# Patient Record
Sex: Male | Born: 1960
Health system: Southern US, Community
[De-identification: ages and names within clinical notes are randomized; demographics above are authoritative.]

## PROBLEM LIST (undated history)

## (undated) DIAGNOSIS — E119 Type 2 diabetes mellitus without complications: Secondary | ICD-10-CM

## (undated) DIAGNOSIS — E785 Hyperlipidemia, unspecified: Secondary | ICD-10-CM

## (undated) DIAGNOSIS — K4020 Bilateral inguinal hernia, without obstruction or gangrene, not specified as recurrent: Secondary | ICD-10-CM

## (undated) DIAGNOSIS — K649 Unspecified hemorrhoids: Secondary | ICD-10-CM

## (undated) DIAGNOSIS — Z87442 Personal history of urinary calculi: Secondary | ICD-10-CM

## (undated) DIAGNOSIS — K219 Gastro-esophageal reflux disease without esophagitis: Secondary | ICD-10-CM

## (undated) DIAGNOSIS — I10 Essential (primary) hypertension: Secondary | ICD-10-CM

## (undated) DIAGNOSIS — I499 Cardiac arrhythmia, unspecified: Secondary | ICD-10-CM

## (undated) DIAGNOSIS — K648 Other hemorrhoids: Secondary | ICD-10-CM

## (undated) HISTORY — DX: Essential (primary) hypertension: I10

## (undated) HISTORY — DX: Hyperlipidemia, unspecified: E78.5

## (undated) HISTORY — PX: COLONOSCOPY: SHX174

## (undated) HISTORY — DX: Bilateral inguinal hernia, without obstruction or gangrene, not specified as recurrent: K40.20

## (undated) HISTORY — DX: Unspecified hemorrhoids: K64.9

## (undated) HISTORY — DX: Other hemorrhoids: K64.8

## (undated) HISTORY — DX: Type 2 diabetes mellitus without complications: E11.9

## (undated) HISTORY — PX: NO PAST SURGERIES: SHX2092

---
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
## 2016-09-23 ENCOUNTER — Encounter (HOSPITAL_COMMUNITY): Payer: Self-pay

## 2016-09-23 ENCOUNTER — Emergency Department (HOSPITAL_COMMUNITY)
Admission: EM | Admit: 2016-09-23 | Discharge: 2016-09-23 | Disposition: A | Discharge status: Home / Self Care | Payer: Self-pay | Attending: Emergency Medicine | Admitting: Emergency Medicine

## 2016-09-23 DIAGNOSIS — R040 Epistaxis: Secondary | ICD-10-CM | POA: Insufficient documentation

## 2016-09-23 DIAGNOSIS — Z7982 Long term (current) use of aspirin: Secondary | ICD-10-CM | POA: Insufficient documentation

## 2016-09-23 DIAGNOSIS — I10 Essential (primary) hypertension: Secondary | ICD-10-CM | POA: Insufficient documentation

## 2016-09-23 DIAGNOSIS — Z79899 Other long term (current) drug therapy: Secondary | ICD-10-CM | POA: Insufficient documentation

## 2016-09-23 LAB — CBC
HCT: 42.4 % (ref 39.0–52.0)
Hemoglobin: 13.6 g/dL (ref 13.0–17.0)
MCH: 29.2 pg (ref 26.0–34.0)
MCHC: 32.1 g/dL (ref 30.0–36.0)
MCV: 91 fL (ref 78.0–100.0)
PLATELETS: 225 10*3/uL (ref 150–400)
RBC: 4.66 MIL/uL (ref 4.22–5.81)
RDW: 14.5 % (ref 11.5–15.5)
WBC: 12.1 10*3/uL — AB (ref 4.0–10.5)

## 2016-09-23 LAB — PROTIME-INR
INR: 1.33
PROTHROMBIN TIME: 16.6 s — AB (ref 11.4–15.2)

## 2016-09-23 LAB — RAPID URINE DRUG SCREEN, HOSP PERFORMED
Amphetamines: NOT DETECTED
BENZODIAZEPINES: NOT DETECTED
Barbiturates: NOT DETECTED
Cocaine: NOT DETECTED
Opiates: NOT DETECTED
Tetrahydrocannabinol: NOT DETECTED

## 2016-09-23 LAB — BASIC METABOLIC PANEL
Anion gap: 8 (ref 5–15)
BUN: 17 mg/dL (ref 6–20)
CALCIUM: 8.9 mg/dL (ref 8.9–10.3)
CO2: 27 mmol/L (ref 22–32)
CREATININE: 1.05 mg/dL (ref 0.61–1.24)
Chloride: 104 mmol/L (ref 101–111)
GFR calc Af Amer: >60 mL/min (ref >60)
GFR calc non Af Amer: >60 mL/min (ref >60)
Glucose, Bld: 150 mg/dL — ABNORMAL HIGH (ref 65–99)
Potassium: 3.6 mmol/L (ref 3.5–5.1)
Sodium: 139 mmol/L (ref 135–145)

## 2016-09-23 LAB — I-STAT TROPONIN, ED: TROPONIN I, POC: 0.01 ng/mL (ref 0.00–0.08)

## 2016-09-23 MED ORDER — OXYMETAZOLINE HCL 0.05 % NA SOLN: 1.0000 | Freq: Once | NASAL | Status: DC

## 2016-09-23 MED ORDER — LABETALOL HCL 5 MG/ML IV SOLN
20.0000 mg | Freq: Once | INTRAVENOUS | Status: AC
  Administered 2016-09-23: 20 mg via INTRAVENOUS

## 2016-09-23 MED ORDER — CEPHALEXIN 500 MG PO CAPS: 500.0000 mg | ORAL_CAPSULE | Freq: Four times a day (QID) | ORAL | 0 refills | Status: DC

## 2016-09-23 MED ORDER — LISINOPRIL-HYDROCHLOROTHIAZIDE 10-12.5 MG PO TABS: 1.0000 | ORAL_TABLET | Freq: Every day | ORAL | 1 refills | Status: DC

## 2016-09-23 NOTE — ED Triage Notes (Signed)
Pt states he began having nose bleed this morning around 0630. Nose still bleeding at this time. Pt denies any pain. Noted to be severely hypertensive, pt also diaphoretic. Pt alert and oriented X4.

## 2016-09-23 NOTE — ED Provider Notes (Signed)
[facility]-EMERGENCY DEPT Provider Note   CSN: [id number] Arrival date & time: 09/23/16  0919     History   Chief Complaint Chief Complaint  Patient presents with  . Epistaxis    HPI Scott Mcmillan is a 56 y.o. male.  HPI Patient presents to the emergency room for evaluation of a nosebleed.  Patient states his symptoms started about 0 6:30 this morning. He suddenly started having bleeding out of his left side of his nose. Patient denies any recent injuries. He denies any prior history of nosebleeds. He denies any history of easy bruising or bleeding. In the emergency room the patient was also noted to be hypertensive. She denies any history of hypertension however he has not seen a doctor in many years. Denies any difficulty with chest pain or shortness of breath. No vomiting or diarrhea. No numbness or weakness History reviewed. No pertinent past medical history.  There are no active problems to display for this patient.   History reviewed. No pertinent surgical history.     Home Medications    Prior to Admission medications   Medication Sig Start Date End Date Taking? Authorizing Provider  aspirin 325 MG tablet Take 325 mg by mouth every 6 (six) hours as needed.   Yes [provider]  cephALEXin (KEFLEX) 500 MG capsule Take 1 capsule (500 mg total) by mouth 4 (four) times daily. 09/23/16   Linwood DibblesKnapp, Roslin Norwood, MD  lisinopril-hydrochlorothiazide (ZESTORETIC) 10-12.5 MG tablet Take 1 tablet by mouth daily. 09/23/16   Linwood DibblesKnapp, Rylie Knierim, MD    Family History History reviewed. No pertinent family history.  Social History Social History  Substance Use Topics  . Smoking status: Never Smoker  . Smokeless tobacco: Never Used  . Alcohol use No     Allergies   Patient has no known allergies.   Review of Systems Review of Systems  All other systems reviewed and are negative.    Physical Exam Updated Vital Signs BP (!) 168/94   Pulse 78   Temp 98.2 F (36.8 C) (Oral)   Resp 16    Ht 1.727 m (5\' 8" )   Wt 136.1 kg (300 lb)   SpO2 95%   BMI 45.61 kg/m   Physical Exam  Constitutional: He appears well-developed and well-nourished. No distress.  HENT:  Head: Normocephalic and atraumatic.  Right Ear: External ear normal.  Left Ear: External ear normal.  Nose: Epistaxis (bright red blood from the left nares, unable to visualize source) is observed.  Eyes: Conjunctivae are normal. Right eye exhibits no discharge. Left eye exhibits no discharge. No scleral icterus.  Neck: Neck supple. No tracheal deviation present.  Cardiovascular: Normal rate, regular rhythm and intact distal pulses.   Pulmonary/Chest: Effort normal and breath sounds normal. No stridor. No respiratory distress. He has no wheezes. He has no rales.  Abdominal: Soft. Bowel sounds are normal. He exhibits no distension. There is no tenderness. There is no rebound and no guarding.  Musculoskeletal: He exhibits no edema or tenderness.  Neurological: He is alert. He has normal strength. No cranial nerve deficit (no facial droop, extraocular movements intact, no slurred speech) or sensory deficit. He exhibits normal muscle tone. He displays no seizure activity. Coordination normal.  Skin: Skin is warm. No rash noted. He is diaphoretic.  Psychiatric: He has a normal mood and affect.  Nursing note and vitals reviewed.    ED Treatments / Results  Labs (all labs ordered are listed, but only abnormal results are displayed) Labs Reviewed  CBC - Abnormal; Notable for the following:       Result Value   WBC 12.1 (*)    All other components within normal limits  BASIC METABOLIC PANEL - Abnormal; Notable for the following:    Glucose, Bld 150 (*)    All other components within normal limits  PROTIME-INR - Abnormal; Notable for the following:    Prothrombin Time 16.6 (*)    All other components within normal limits  RAPID URINE DRUG SCREEN, HOSP PERFORMED  I-STAT TROPOININ, ED    EKG  EKG  Interpretation  Date/Time:  Tuesday Sep 23 2016 10:50:55 EDT Ventricular Rate:  83 PR Interval:    QRS Duration: 103 QT Interval:  394 QTC Calculation: 463 R Axis:   88 Text Interpretation:  Sinus rhythm Ventricular premature complex Probable left atrial enlargement Left ventricular hypertrophy Baseline wander in lead(s) V3 V4 No old tracing to compare Confirmed by Linwood Dibbles [phone]) on 09/23/2016 10:58:38 AM        Procedures .Epistaxis Management Date/Time: 09/23/2016 10:30 AM Performed by: Linwood Dibbles Authorized by: Linwood Dibbles   Consent:    Consent obtained:  Verbal   Consent given by:  Patient Anesthesia (see MAR for exact dosages):    Anesthesia method:  Topical application   Topical anesthetic:  Epinephrine (with afrin) Procedure details:    Treatment site:  L anterior   Treatment method:  Nasal balloon   Treatment complexity:  Extensive   Treatment episode: initial   Post-procedure details:    Assessment:  No improvement   Patient tolerance of procedure:  Tolerated well, no immediate complications Comments:     2nd attempt made with a 7.5 rhinostat. Bleeding persisted.  Additional anterior rhino rocket placed adjacent to the nasal balloon.  Bleeding seems to have stopped now.  10:35 AM    (including critical care time)  Medications Ordered in ED Medications  labetalol (NORMODYNE,TRANDATE) injection 20 mg (20 mg Intravenous Given 09/23/16 1048)     Initial Impression / Assessment and Plan / ED Course  I have reviewed the triage vital signs and the nursing notes.  Pertinent labs & imaging results that were available during my care of the patient were reviewed by me and considered in my medical decision making (see chart for details).  Clinical Course as of Sep 23 1225  Tue Sep 23, 2016  6045 Constant swab soaked in Afrin and lidocaine placed in the left nares.  We'll proceed with evaluation of his hypertension and reassess his bleeding and short period of time   [JK]  1029 Initial attempt with 5.5 cm rhinostat did not control the bleeding.  Replaced with a 7.5.  Bleeding seems to be coming from just posterior to the nasal turbinate.  [JK]  1100 Bleeding is controlled  [JK]    Clinical Course User Index [JK] Linwood Dibbles, MD    Patient's nosebleed was ultimately controlled with treatment in the emergency room. I suspect he has an anterior bleed that did respond to nasal packing. Plan on discharge home with ENT follow-up.  Patient was extremely hypertensive when he arrived.  Patient medically has a history of hypertension. He used to be on blood pressure medications. He has not been able to see a doctor for years because of financial issues.  Patient states he was taking Hyzaar and then Cozaar one point. He is not sure the exact dosages. He thinks he was eventually switched to lisinopril.  Patient denies any chest pain or shortness of  breath. I doubt hypertensive emergency/urgency. I will discharge him home on lisinopril HCTZ and recommended he follow up with a primary care doctor. I provided the name of the [facility]  Final Clinical Impressions(s) / ED Diagnoses   Final diagnoses:  Epistaxis  Hypertension, unspecified type    New Prescriptions New Prescriptions   CEPHALEXIN (KEFLEX) 500 MG CAPSULE    Take 1 capsule (500 mg total) by mouth 4 (four) times daily.   LISINOPRIL-HYDROCHLOROTHIAZIDE (ZESTORETIC) 10-12.5 MG TABLET    Take 1 tablet by mouth daily.     Linwood Dibbles, MD 09/23/16 [phone]

## 2016-09-25 ENCOUNTER — Ambulatory Visit (INDEPENDENT_AMBULATORY_CARE_PROVIDER_SITE_OTHER): Payer: Self-pay | Admitting: Otolaryngology

## 2016-09-25 DIAGNOSIS — R040 Epistaxis: Secondary | ICD-10-CM

## 2016-10-03 ENCOUNTER — Ambulatory Visit: Payer: Self-pay | Attending: Internal Medicine | Admitting: Physician Assistant

## 2016-10-03 VITALS — BP 163/100 | HR 93 | Temp 98.0°F | Resp 18 | Ht 64.0 in | Wt 270.0 lb

## 2016-10-03 DIAGNOSIS — I10 Essential (primary) hypertension: Secondary | ICD-10-CM | POA: Insufficient documentation

## 2016-10-03 DIAGNOSIS — R040 Epistaxis: Secondary | ICD-10-CM | POA: Insufficient documentation

## 2016-10-03 DIAGNOSIS — R739 Hyperglycemia, unspecified: Secondary | ICD-10-CM | POA: Insufficient documentation

## 2016-10-03 DIAGNOSIS — D72829 Elevated white blood cell count, unspecified: Secondary | ICD-10-CM | POA: Insufficient documentation

## 2016-10-03 LAB — POCT GLYCOSYLATED HEMOGLOBIN (HGB A1C): Hemoglobin A1C: 5.3

## 2016-10-03 LAB — GLUCOSE, POCT (MANUAL RESULT ENTRY): POC Glucose: 105 mg/dl — AB (ref 70–99)

## 2016-10-03 MED ORDER — LISINOPRIL-HYDROCHLOROTHIAZIDE 20-25 MG PO TABS: 1.0000 | ORAL_TABLET | Freq: Every day | ORAL | 3 refills | Status: DC

## 2016-10-03 NOTE — Progress Notes (Signed)
Patient is here for HFU:HTN  Patient denies pain at this time.  Patient took BP medication around 5:30am. Patient has not eaten today.

## 2016-10-03 NOTE — Progress Notes (Signed)
Patient ID: Scott Mcmillan, male   DOB: Jul 21, 1960, 56 y.o.   MRN: [id number]     Scott Mcmillan, is a 56 y.o. male  WUJ:[id number]  [id number]:[id number]  DOB - 04-05-1961  Subjective:  Chief Complaint and HPI: Scott Mcmillan is a 56 y.o. male here today to establish care and for a follow up visit After being seen in the ED for nosebleed on 09/23/2016.  Nasal balloon and rhinostat and rhino rocket required to stop bleeding.  He used to be on BP meds but hadn't been under the care of a PCP in years and not on meds for years.  Lisinopril/HCTZ was prescribed on discharge.    Today he presents doing well.  He is taking BP meds but hasn''t taken today's dose.  He saw the ENT a few days ago, packing was removed, and the area was cauterized.  He denies other bleeding such as bleeding gums, rectal bleeding, or other nose bleeds or easy bruising.  He denies CP/HA/dizziness.   EKG in ED: Ventricular Rate:   83 PR Interval:                        QRS Duration:        103 QT Interval:                      394 QTC Calculation:    463 R Axis:                         88 Text Interpretation:  Sinus rhythm Ventricular premature complex Probable left atrial enlargement Left ventricular hypertrophy Baseline wander in lead(s) V3 V4 No old tracing to compare Confirmed by Lynelle Doctor  Labs: WBC 12.1 (*)     All other components within normal limits  BASIC METABOLIC PANEL - Abnormal; Notable for the following:    Glucose, Bld 150 (*)    All other components within normal limits  PROTIME-INR - Abnormal; Notable for the following:    Prothrombin Time 16.6 (*)    Cardiac enzymes negative .   ED/Hospital notes reviewed and summarized as above.  ROS:   Constitutional:  No f/c, No night sweats, No unexplained weight loss. EENT:  No vision changes, No blurry vision, No hearing changes. No mouth, throat, or ear problems.  Respiratory: No cough, No SOB Cardiac: No CP, no palpitations GI:  No abd pain, No N/V/D. GU:  No Urinary s/sx Musculoskeletal: No joint pain Neuro: No headache, no dizziness, no motor weakness.  Skin: No rash Endocrine:  No polydipsia. No polyuria.  Psych: Denies SI/HI  No problems updated.  ALLERGIES: No Known Allergies  PAST MEDICAL HISTORY: History reviewed. No pertinent past medical history.  MEDICATIONS AT HOME: Prior to Admission medications   Medication Sig Start Date End Date Taking? Authorizing Provider  lisinopril-hydrochlorothiazide (PRINZIDE,ZESTORETIC) 20-25 MG tablet Take 1 tablet by mouth daily. 10/03/16   Anders Simmonds, PA-C     Objective:  EXAM:   Vitals:   10/03/16 0851  BP: (!) 163/100  Pulse: 93  Resp: 18  Temp: 98 F (36.7 C)  TempSrc: Oral  SpO2: 97%  Weight: 270 lb (122.5 kg)  Height: 5\' 4"  (1.626 m)    General appearance : A&OX3. NAD. Non-toxic-appearing, obese HEENT: Atraumatic and Normocephalic.  PERRLA. EOM intact.  TM clear B. Mouth-MMM, post pharynx WNL w/o erythema, No PND. Nose with scabbing and no bleeding. Neck: supple, no JVD.  No cervical lymphadenopathy. No thyromegaly Chest/Lungs:  Breathing-non-labored, Good air entry bilaterally, breath sounds normal without rales, rhonchi, or wheezing  CVS: S1 S2 regular, no murmurs, gallops, rubs Extremities: Bilateral Lower Ext shows no edema, both legs are warm to touch with = pulse throughout Neurology:  CN II-XII grossly intact, Non focal.   Psych:  TP linear. J/I WNL. Normal speech. Appropriate eye contact and affect.  Skin:  No Rash  Data Review No results found for: HGBA1C   Assessment & Plan   1. Hyperglycemia in ED I have had a lengthy discussion and provided education about insulin resistance and the intake of too much sugar/refined carbohydrates.  I have advised the patient to work at a goal of eliminating sugary drinks, candy, desserts, sweets, refined sugars, processed foods, and white carbohydrates.  The patient expresses understanding.  - Glucose (CBG) - HgB  A1c  2. Leukocytosis, unspecified type Likely due to recent nose bleed. CBC otherwise stable  3. Epistaxis resolved  4. Hypertension, unspecified type Uncontrolled.  Increase dose on Lisinopril/HCT from 10/12.5 to- - lisinopril-hydrochlorothiazide (PRINZIDE,ZESTORETIC) 20-25 MG tablet; Take 1 tablet by mouth daily.  Dispense: 90 tablet; Refill: 3 Check blood pressure 3 times weekly and record and bring to next visit.  Patient have been counseled extensively about nutrition and exercise  Return in about 3 weeks (around 10/24/2016) for assign PCP; f/up htn.  The patient was given clear instructions to go to ER or return to medical center if symptoms don't improve, worsen or new problems develop. The patient verbalized understanding. The patient was told to call to get lab results if they haven't heard anything in the next week.     Georgian CoAngela McClung, PA-C [facility] [place], [place] [phone]   10/03/2016, 9:06 AM

## 2016-10-03 NOTE — Patient Instructions (Signed)
Check blood pressure 3 times weekly and record and bring to next visit.     Hypertension Hypertension, commonly called high blood pressure, is when the force of blood pumping through the arteries is too strong. The arteries are the blood vessels that carry blood from the heart throughout the body. Hypertension forces the heart to work harder to pump blood and may cause arteries to become narrow or stiff. Having untreated or uncontrolled hypertension can cause heart attacks, strokes, kidney disease, and other problems. A blood pressure reading consists of a higher number over a lower number. Ideally, your blood pressure should be below 120/80. The first ("top") number is called the systolic pressure. It is a measure of the pressure in your arteries as your heart beats. The second ("bottom") number is called the diastolic pressure. It is a measure of the pressure in your arteries as the heart relaxes. What are the causes? The cause of this condition is not known. What increases the risk? Some risk factors for high blood pressure are under your control. Others are not. Factors you can change  Smoking.  Having type 2 diabetes mellitus, high cholesterol, or both.  Not getting enough exercise or physical activity.  Being overweight.  Having too much fat, sugar, calories, or salt (sodium) in your diet.  Drinking too much alcohol. Factors that are difficult or impossible to change  Having chronic kidney disease.  Having a family history of high blood pressure.  Age. Risk increases with age.  Race. You may be at higher risk if you are African-American.  Gender. Men are at higher risk than women before age 45. After age 65, women are at higher risk than men.  Having obstructive sleep apnea.  Stress. What are the signs or symptoms? Extremely high blood pressure (hypertensive crisis) may cause:  Headache.  Anxiety.  Shortness of breath.  Nosebleed.  Nausea and  vomiting.  Severe chest pain.  Jerky movements you cannot control (seizures).  How is this diagnosed? This condition is diagnosed by measuring your blood pressure while you are seated, with your arm resting on a surface. The cuff of the blood pressure monitor will be placed directly against the skin of your upper arm at the level of your heart. It should be measured at least twice using the same arm. Certain conditions can cause a difference in blood pressure between your right and left arms. Certain factors can cause blood pressure readings to be lower or higher than normal (elevated) for a short period of time:  When your blood pressure is higher when you are in a health care provider's office than when you are at home, this is called white coat hypertension. Most people with this condition do not need medicines.  When your blood pressure is higher at home than when you are in a health care provider's office, this is called masked hypertension. Most people with this condition may need medicines to control blood pressure.  If you have a high blood pressure reading during one visit or you have normal blood pressure with other risk factors:  You may be asked to return on a different day to have your blood pressure checked again.  You may be asked to monitor your blood pressure at home for 1 week or longer.  If you are diagnosed with hypertension, you may have other blood or imaging tests to help your health care provider understand your overall risk for other conditions. How is this treated? This condition is treated by   making healthy lifestyle changes, such as eating healthy foods, exercising more, and reducing your alcohol intake. Your health care provider may prescribe medicine if lifestyle changes are not enough to get your blood pressure under control, and if:  Your systolic blood pressure is above 130.  Your diastolic blood pressure is above 80.  Your personal target blood pressure  may vary depending on your medical conditions, your age, and other factors. Follow these instructions at home: Eating and drinking  Eat a diet that is high in fiber and potassium, and low in sodium, added sugar, and fat. An example eating plan is called the DASH (Dietary Approaches to Stop Hypertension) diet. To eat this way: ? Eat plenty of fresh fruits and vegetables. Try to fill half of your plate at each meal with fruits and vegetables. ? Eat whole grains, such as whole wheat pasta, brown rice, or whole grain bread. Fill about one quarter of your plate with whole grains. ? Eat or drink low-fat dairy products, such as skim milk or low-fat yogurt. ? Avoid fatty cuts of meat, processed or cured meats, and poultry with skin. Fill about one quarter of your plate with lean proteins, such as fish, chicken without skin, beans, eggs, and tofu. ? Avoid premade and processed foods. These tend to be higher in sodium, added sugar, and fat.  Reduce your daily sodium intake. Most people with hypertension should eat less than 1,500 mg of sodium a day.  Limit alcohol intake to no more than 1 drink a day for nonpregnant women and 2 drinks a day for men. One drink equals 12 oz of beer, 5 oz of wine, or 1 oz of hard liquor. Lifestyle  Work with your health care provider to maintain a healthy body weight or to lose weight. Ask what an ideal weight is for you.  Get at least 30 minutes of exercise that causes your heart to beat faster (aerobic exercise) most days of the week. Activities may include walking, swimming, or biking.  Include exercise to strengthen your muscles (resistance exercise), such as pilates or lifting weights, as part of your weekly exercise routine. Try to do these types of exercises for 30 minutes at least 3 days a week.  Do not use any products that contain nicotine or tobacco, such as cigarettes and e-cigarettes. If you need help quitting, ask your health care provider.  Monitor your  blood pressure at home as told by your health care provider.  Keep all follow-up visits as told by your health care provider. This is important. Medicines  Take over-the-counter and prescription medicines only as told by your health care provider. Follow directions carefully. Blood pressure medicines must be taken as prescribed.  Do not skip doses of blood pressure medicine. Doing this puts you at risk for problems and can make the medicine less effective.  Ask your health care provider about side effects or reactions to medicines that you should watch for. Contact a health care provider if:  You think you are having a reaction to a medicine you are taking.  You have headaches that keep coming back (recurring).  You feel dizzy.  You have swelling in your ankles.  You have trouble with your vision. Get help right away if:  You develop a severe headache or confusion.  You have unusual weakness or numbness.  You feel faint.  You have severe pain in your chest or abdomen.  You vomit repeatedly.  You have trouble breathing. Summary  Hypertension is   when the force of blood pumping through your arteries is too strong. If this condition is not controlled, it may put you at risk for serious complications.  Your personal target blood pressure may vary depending on your medical conditions, your age, and other factors. For most people, a normal blood pressure is less than 120/80.  Hypertension is treated with lifestyle changes, medicines, or a combination of both. Lifestyle changes include weight loss, eating a healthy, low-sodium diet, exercising more, and limiting alcohol. This information is not intended to replace advice given to you by your health care provider. Make sure you discuss any questions you have with your health care provider. Document Released: 04/21/2005 Document Revised: 03/19/2016 Document Reviewed: 03/19/2016 Elsevier Interactive Patient Education  2018 [employer]  

## 2016-10-31 ENCOUNTER — Ambulatory Visit: Payer: Self-pay | Attending: Family Medicine | Admitting: Family Medicine

## 2016-10-31 ENCOUNTER — Encounter: Payer: Self-pay | Admitting: Family Medicine

## 2016-10-31 VITALS — BP 178/116 | HR 92 | Temp 97.9°F | Resp 18 | Ht 67.0 in | Wt 263.0 lb

## 2016-10-31 DIAGNOSIS — H547 Unspecified visual loss: Secondary | ICD-10-CM

## 2016-10-31 DIAGNOSIS — I10 Essential (primary) hypertension: Secondary | ICD-10-CM | POA: Insufficient documentation

## 2016-10-31 LAB — POCT UA - MICROALBUMIN
CREATININE, POC: 100 mg/dL
Microalbumin Ur, POC: 150 mg/L

## 2016-10-31 MED ORDER — AMLODIPINE BESYLATE 2.5 MG PO TABS: 2.5000 mg | ORAL_TABLET | Freq: Every day | ORAL | 3 refills | Status: DC

## 2016-10-31 MED ORDER — CLONIDINE HCL 0.2 MG PO TABS
0.2000 mg | ORAL_TABLET | Freq: Once | ORAL | Status: AC
  Administered 2016-10-31: 0.2 mg via ORAL

## 2016-10-31 MED ORDER — LISINOPRIL 40 MG PO TABS: 40.0000 mg | ORAL_TABLET | Freq: Every day | ORAL | 3 refills | Status: DC

## 2016-10-31 MED ORDER — HYDROCHLOROTHIAZIDE 25 MG PO TABS: 25.0000 mg | ORAL_TABLET | Freq: Every day | ORAL | 3 refills | Status: DC

## 2016-10-31 NOTE — Patient Instructions (Addendum)
Continue to keep log of daily BP and bring to next office visit. Apply for orange card.  Hypertension Hypertension, commonly called high blood pressure, is when the force of blood pumping through the arteries is too strong. The arteries are the blood vessels that carry blood from the heart throughout the body. Hypertension forces the heart to work harder to pump blood and may cause arteries to become narrow or stiff. Having untreated or uncontrolled hypertension can cause heart attacks, strokes, kidney disease, and other problems. A blood pressure reading consists of a higher number over a lower number. Ideally, your blood pressure should be below 120/80. The first ("top") number is called the systolic pressure. It is a measure of the pressure in your arteries as your heart beats. The second ("bottom") number is called the diastolic pressure. It is a measure of the pressure in your arteries as the heart relaxes. What are the causes? The cause of this condition is not known. What increases the risk? Some risk factors for high blood pressure are under your control. Others are not. Factors you can change  Smoking.  Having type 2 diabetes mellitus, high cholesterol, or both.  Not getting enough exercise or physical activity.  Being overweight.  Having too much fat, sugar, calories, or salt (sodium) in your diet.  Drinking too much alcohol. Factors that are difficult or impossible to change  Having chronic kidney disease.  Having a family history of high blood pressure.  Age. Risk increases with age.  Race. You may be at higher risk if you are African-American.  Gender. Men are at higher risk than women before age 43. After age 36, women are at higher risk than men.  Having obstructive sleep apnea.  Stress. What are the signs or symptoms? Extremely high blood pressure (hypertensive crisis) may cause:  Headache.  Anxiety.  Shortness of breath.  Nosebleed.  Nausea and  vomiting.  Severe chest pain.  Jerky movements you cannot control (seizures).  How is this diagnosed? This condition is diagnosed by measuring your blood pressure while you are seated, with your arm resting on a surface. The cuff of the blood pressure monitor will be placed directly against the skin of your upper arm at the level of your heart. It should be measured at least twice using the same arm. Certain conditions can cause a difference in blood pressure between your right and left arms. Certain factors can cause blood pressure readings to be lower or higher than normal (elevated) for a short period of time:  When your blood pressure is higher when you are in a health care provider's office than when you are at home, this is called white coat hypertension. Most people with this condition do not need medicines.  When your blood pressure is higher at home than when you are in a health care provider's office, this is called masked hypertension. Most people with this condition may need medicines to control blood pressure.  If you have a high blood pressure reading during one visit or you have normal blood pressure with other risk factors:  You may be asked to return on a different day to have your blood pressure checked again.  You may be asked to monitor your blood pressure at home for 1 week or longer.  If you are diagnosed with hypertension, you may have other blood or imaging tests to help your health care provider understand your overall risk for other conditions. How is this treated? This condition is treated  by making healthy lifestyle changes, such as eating healthy foods, exercising more, and reducing your alcohol intake. Your health care provider may prescribe medicine if lifestyle changes are not enough to get your blood pressure under control, and if:  Your systolic blood pressure is above 130.  Your diastolic blood pressure is above 80.  Your personal target blood pressure  may vary depending on your medical conditions, your age, and other factors. Follow these instructions at home: Eating and drinking  Eat a diet that is high in fiber and potassium, and low in sodium, added sugar, and fat. An example eating plan is called the DASH (Dietary Approaches to Stop Hypertension) diet. To eat this way: ? Eat plenty of fresh fruits and vegetables. Try to fill half of your plate at each meal with fruits and vegetables. ? Eat whole grains, such as whole wheat pasta, brown rice, or whole grain bread. Fill about one quarter of your plate with whole grains. ? Eat or drink low-fat dairy products, such as skim milk or low-fat yogurt. ? Avoid fatty cuts of meat, processed or cured meats, and poultry with skin. Fill about one quarter of your plate with lean proteins, such as fish, chicken without skin, beans, eggs, and tofu. ? Avoid premade and processed foods. These tend to be higher in sodium, added sugar, and fat.  Reduce your daily sodium intake. Most people with hypertension should eat less than 1,500 mg of sodium a day.  Limit alcohol intake to no more than 1 drink a day for nonpregnant women and 2 drinks a day for men. One drink equals 12 oz of beer, 5 oz of wine, or 1 oz of hard liquor. Lifestyle  Work with your health care provider to maintain a healthy body weight or to lose weight. Ask what an ideal weight is for you.  Get at least 30 minutes of exercise that causes your heart to beat faster (aerobic exercise) most days of the week. Activities may include walking, swimming, or biking.  Include exercise to strengthen your muscles (resistance exercise), such as pilates or lifting weights, as part of your weekly exercise routine. Try to do these types of exercises for 30 minutes at least 3 days a week.  Do not use any products that contain nicotine or tobacco, such as cigarettes and e-cigarettes. If you need help quitting, ask your health care provider.  Monitor your  blood pressure at home as told by your health care provider.  Keep all follow-up visits as told by your health care provider. This is important. Medicines  Take over-the-counter and prescription medicines only as told by your health care provider. Follow directions carefully. Blood pressure medicines must be taken as prescribed.  Do not skip doses of blood pressure medicine. Doing this puts you at risk for problems and can make the medicine less effective.  Ask your health care provider about side effects or reactions to medicines that you should watch for. Contact a health care provider if:  You think you are having a reaction to a medicine you are taking.  You have headaches that keep coming back (recurring).  You feel dizzy.  You have swelling in your ankles.  You have trouble with your vision. Get help right away if:  You develop a severe headache or confusion.  You have unusual weakness or numbness.  You feel faint.  You have severe pain in your chest or abdomen.  You vomit repeatedly.  You have trouble breathing. Summary  Hypertension  is when the force of blood pumping through your arteries is too strong. If this condition is not controlled, it may put you at risk for serious complications.  Your personal target blood pressure may vary depending on your medical conditions, your age, and other factors. For most people, a normal blood pressure is less than 120/80.  Hypertension is treated with lifestyle changes, medicines, or a combination of both. Lifestyle changes include weight loss, eating a healthy, low-sodium diet, exercising more, and limiting alcohol. This information is not intended to replace advice given to you by your health care provider. Make sure you discuss any questions you have with your health care provider. Document Released: 04/21/2005 Document Revised: 03/19/2016 Document Reviewed: 03/19/2016 Elsevier Interactive Patient Education  2018 [employer]  Amlodipine tablets What is this medicine? AMLODIPINE (am LOE di peen) is a calcium-channel blocker. It affects the amount of calcium found in your heart and muscle cells. This relaxes your blood vessels, which can reduce the amount of work the heart has to do. This medicine is used to lower high blood pressure. It is also used to prevent chest pain. This medicine may be used for other purposes; ask your health care provider or pharmacist if you have questions. COMMON BRAND NAME(S): Norvasc What should I tell my health care provider before I take this medicine? They need to know if you have any of these conditions: -heart problems like heart failure or aortic stenosis -liver disease -an unusual or allergic reaction to amlodipine, other medicines, foods, dyes, or preservatives -pregnant or trying to get pregnant -breast-feeding How should I use this medicine? Take this medicine by mouth with a glass of water. Follow the directions on the prescription label. Take your medicine at regular intervals. Do not take more medicine than directed. Talk to your pediatrician regarding the use of this medicine in children. Special care may be needed. This medicine has been used in children as young as 6. Persons over 58 years old may have a stronger reaction to this medicine and need smaller doses. Overdosage: If you think you have taken too much of this medicine contact a poison control center or emergency room at once. NOTE: This medicine is only for you. Do not share this medicine with others. What if I miss a dose? If you miss a dose, take it as soon as you can. If it is almost time for your next dose, take only that dose. Do not take double or extra doses. What may interact with this medicine? -herbal or dietary supplements -local or general anesthetics -medicines for high blood pressure -medicines for prostate problems -rifampin This list may not describe all possible interactions. Give your  health care provider a list of all the medicines, herbs, non-prescription drugs, or dietary supplements you use. Also tell them if you smoke, drink alcohol, or use illegal drugs. Some items may interact with your medicine. What should I watch for while using this medicine? Visit your doctor or health care professional for regular check ups. Check your blood pressure and pulse rate regularly. Ask your health care professional what your blood pressure and pulse rate should be, and when you should contact him or her. This medicine may make you feel confused, dizzy or lightheaded. Do not drive, use machinery, or do anything that needs mental alertness until you know how this medicine affects you. To reduce the risk of dizzy or fainting spells, do not sit or stand up quickly, especially if you are an older  patient. Avoid alcoholic drinks; they can make you more dizzy. Do not suddenly stop taking amlodipine. Ask your doctor or health care professional how you can gradually reduce the dose. What side effects may I notice from receiving this medicine? Side effects that you should report to your doctor or health care professional as soon as possible: -allergic reactions like skin rash, itching or hives, swelling of the face, lips, or tongue -breathing problems -changes in vision or hearing -chest pain -fast, irregular heartbeat -swelling of legs or ankles Side effects that usually do not require medical attention (report to your doctor or health care professional if they continue or are bothersome): -dry mouth -facial flushing -nausea, vomiting -stomach gas, pain -tired, weak -trouble sleeping This list may not describe all possible side effects. Call your doctor for medical advice about side effects. You may report side effects to FDA at 1-800-FDA-1088. Where should I keep my medicine? Keep out of the reach of children. Store at room temperature between 59 and 86 degrees F (15 and 30 degrees C).  Protect from light. Keep container tightly closed. Throw away any unused medicine after the expiration date. NOTE: This sheet is a summary. It may not cover all possible information. If you have questions about this medicine, talk to your doctor, pharmacist, or health care provider.  2018 Elsevier/Gold Standard (2012-03-19 11:40:58)

## 2016-10-31 NOTE — Progress Notes (Signed)
Patient is here for HTN  

## 2016-10-31 NOTE — Progress Notes (Signed)
Subjective:  Patient ID: Scott Mcmillan, male    DOB: July 16, 1960  Age: 56 y.o. MRN: [id number]  CC: Hypertension   HPI Scott Mcmillan presents for follow-up of elevated blood pressure. He is not exercising and is not adherent to low salt diet.  Blood pressure is not well controlled at home. He brings a list of BP readings and BP cuff in with him. SBP 181-141; DBP 91-115; Cardiac symptoms none. Patient denies chest pain, chest pressure/discomfort, claudication, dyspnea, lower extremity edema, near-syncope, palpitations and syncope.  Cardiovascular risk factors: advanced age (older than 28 for men, 1 for women), hypertension, male gender, obesity (BMI >= 30 kg/m2) and sedentary lifestyle. Use of agents associated with hypertension: none. History of target organ damage: none.    Outpatient Medications Prior to Visit  Medication Sig Dispense Refill  . lisinopril-hydrochlorothiazide (PRINZIDE,ZESTORETIC) 20-25 MG tablet Take 1 tablet by mouth daily. 90 tablet 3   No facility-administered medications prior to visit.     ROS Review of Systems  Constitutional: Negative.   Eyes:       Decreased visual acuity  Respiratory: Negative.   Cardiovascular: Negative.   Gastrointestinal: Negative.   Skin: Negative.    Objective:  BP (!) 178/116   Pulse 92   Temp 97.9 F (36.6 C) (Oral)   Resp 18   Ht '5\' 7"'  (1.702 m)   Wt 263 lb (119.3 kg)   SpO2 96%   BMI 41.19 kg/m   BP/Weight 10/31/2016 10/03/2016 6/83/7290  Systolic BP 211 155 208  Diastolic BP 022 336 96  Wt. (Lbs) 263 270 300  BMI 41.19 46.35 45.61    Physical Exam  Constitutional: He appears well-developed and well-nourished.  Eyes: Conjunctivae are normal. Pupils are equal, round, and reactive to light.  Neck: No JVD present.  Cardiovascular: Normal rate, regular rhythm, normal heart sounds and intact distal pulses.   Pulmonary/Chest: Effort normal and breath sounds normal.  Abdominal: Soft. Bowel sounds are normal.  Skin:  Skin is warm and dry.  Psychiatric: He has a normal mood and affect.  Nursing note and vitals reviewed.   Assessment & Plan:   Problem List Items Addressed This Visit      Cardiovascular and Mediastinum   Hypertension - Primary   Clonidine given per protocol.   Schedule BP recheck in 2 weeks with clinic RN.   If BP is greater than 90/60 (MAP 65 or greater) but not less than 130/80 may increase dose    of amlodipine to 5 mg QD and recheck in another 2 weeks with clinic RN.   Follow up with PCP in 8 weeks.    Relevant Medications   cloNIDine (CATAPRES) tablet 0.2 mg (Completed)   hydrochlorothiazide (HYDRODIURIL) 25 MG tablet   lisinopril (PRINIVIL,ZESTRIL) 40 MG tablet   amLODipine (NORVASC) 2.5 MG tablet   Other Relevant Orders   CMP14+EGFR   Lipid Panel   POCT UA - Microalbumin (Completed)    Other Visit Diagnoses    Decreased visual acuity       Vision acuity screen performed.   Relevant Orders   Ambulatory referral to Ophthalmology      Meds ordered this encounter  Medications  . cloNIDine (CATAPRES) tablet 0.2 mg  . hydrochlorothiazide (HYDRODIURIL) 25 MG tablet    Sig: Take 1 tablet (25 mg total) by mouth daily.    Dispense:  30 tablet    Refill:  3    Order Specific Question:   Supervising Provider  AnswerTresa Garter [id number]  . lisinopril (PRINIVIL,ZESTRIL) 40 MG tablet    Sig: Take 1 tablet (40 mg total) by mouth daily.    Dispense:  30 tablet    Refill:  3    Order Specific Question:   Supervising Provider    Answer:   Tresa Garter [id number]  . amLODipine (NORVASC) 2.5 MG tablet    Sig: Take 1 tablet (2.5 mg total) by mouth daily.    Dispense:  30 tablet    Refill:  3    Order Specific Question:   Supervising Provider    Answer:   Tresa Garter [id number]    Follow-up: Return in about 2 weeks (around 11/14/2016) for BP check with clinic RN.  Alfonse Spruce FNP

## 2016-11-01 LAB — CMP14+EGFR
ALT: 20 IU/L (ref 0–44)
AST: 21 IU/L (ref 0–40)
Albumin/Globulin Ratio: 1.3 (ref 1.2–2.2)
Albumin: 4.4 g/dL (ref 3.5–5.5)
Alkaline Phosphatase: 60 IU/L (ref 39–117)
BUN/Creatinine Ratio: 14 (ref 9–20)
BUN: 14 mg/dL (ref 6–24)
Bilirubin Total: 0.6 mg/dL (ref 0.0–1.2)
CALCIUM: 9.9 mg/dL (ref 8.7–10.2)
CHLORIDE: 96 mmol/L (ref 96–106)
CO2: 25 mmol/L (ref 20–29)
CREATININE: 1 mg/dL (ref 0.76–1.27)
GFR calc Af Amer: 95 mL/min/{1.73_m2} (ref >59)
GFR calc non Af Amer: 83 mL/min/{1.73_m2} (ref >59)
Globulin, Total: 3.5 g/dL (ref 1.5–4.5)
Glucose: 96 mg/dL (ref 65–99)
POTASSIUM: 3.7 mmol/L (ref 3.5–5.2)
SODIUM: 138 mmol/L (ref 134–144)
TOTAL PROTEIN: 7.9 g/dL (ref 6.0–8.5)

## 2016-11-03 ENCOUNTER — Ambulatory Visit: Payer: Self-pay | Attending: Family Medicine

## 2016-11-11 ENCOUNTER — Other Ambulatory Visit: Payer: Self-pay | Admitting: Family Medicine

## 2016-11-11 DIAGNOSIS — I10 Essential (primary) hypertension: Secondary | ICD-10-CM

## 2016-11-12 ENCOUNTER — Telehealth: Payer: Self-pay

## 2016-11-12 ENCOUNTER — Other Ambulatory Visit: Payer: Self-pay | Admitting: Family Medicine

## 2016-11-12 DIAGNOSIS — Z13220 Encounter for screening for lipoid disorders: Secondary | ICD-10-CM

## 2016-11-12 NOTE — Telephone Encounter (Signed)
-----   Message from Lizbeth BarkMandesia R Hairston, FNP sent at 11/12/2016  8:20 AM EDT ----- Kidney function normal Liver function normal Labs normal. Cholesterol screening lab was collected but specimen could not be resulted. Recommend scheduling walk in appt. with lab for cholesterol screening, or can recheck at next follow up visit.

## 2016-11-12 NOTE — Telephone Encounter (Signed)
CMA call regarding lab results   Patient did not answer but left a VM stating the reason of the call & to call back  

## 2016-11-12 NOTE — Telephone Encounter (Signed)
Pt returning your call about the lab result, please call him back

## 2016-11-13 NOTE — Telephone Encounter (Signed)
CMA call patient regarding lab results  Patient verify DOB  Patient was aware and understood    

## 2016-11-14 ENCOUNTER — Ambulatory Visit: Payer: Self-pay | Attending: Family Medicine | Admitting: *Deleted

## 2016-11-14 VITALS — BP 167/105 | HR 77

## 2016-11-14 DIAGNOSIS — Z13220 Encounter for screening for lipoid disorders: Secondary | ICD-10-CM

## 2016-11-14 DIAGNOSIS — Z0130 Encounter for examination of blood pressure without abnormal findings: Secondary | ICD-10-CM

## 2016-11-14 DIAGNOSIS — I10 Essential (primary) hypertension: Secondary | ICD-10-CM

## 2016-11-14 MED ORDER — CLONIDINE HCL 0.2 MG PO TABS
0.2000 mg | ORAL_TABLET | Freq: Once | ORAL | Status: AC
  Administered 2016-11-14: 0.2 mg via ORAL

## 2016-11-14 MED ORDER — AMLODIPINE BESYLATE 5 MG PO TABS: 5.0000 mg | ORAL_TABLET | Freq: Every day | ORAL | 0 refills | Status: DC

## 2016-11-14 NOTE — Progress Notes (Signed)
[facility] Pt alert and oriented and arrives in good spirits. Last OV 10/31/16 with M.Hairston,FNP.  Pt denies chest pain, SOB, HA, dizziness, or blurred vision.  BP readings are elevated at home.  Verified medication with patient. He states medication was  taken this morning.  Manual blood pressure reading: 167/105 and rechecked 154/98   Plan Increase Amlodipine 5mg  daily F/u in 2 week for BP check with RN.

## 2016-11-15 LAB — LIPID PANEL
CHOLESTEROL TOTAL: 160 mg/dL (ref 100–199)
Chol/HDL Ratio: 4.4 ratio (ref 0.0–5.0)
HDL: 36 mg/dL — ABNORMAL LOW (ref >39)
LDL Calculated: 105 mg/dL — ABNORMAL HIGH (ref 0–99)
Triglycerides: 95 mg/dL (ref 0–149)
VLDL Cholesterol Cal: 19 mg/dL (ref 5–40)

## 2016-11-17 ENCOUNTER — Telehealth: Payer: Self-pay

## 2016-11-17 ENCOUNTER — Other Ambulatory Visit: Payer: Self-pay | Admitting: Family Medicine

## 2016-11-17 DIAGNOSIS — E785 Hyperlipidemia, unspecified: Secondary | ICD-10-CM

## 2016-11-17 MED ORDER — ASPIRIN EC 81 MG PO TBEC: 81.0000 mg | DELAYED_RELEASE_TABLET | Freq: Every day | ORAL | 3 refills | Status: DC

## 2016-11-17 MED ORDER — PRAVASTATIN SODIUM 20 MG PO TABS: 20.0000 mg | ORAL_TABLET | Freq: Every day | ORAL | 0 refills | Status: DC

## 2016-11-17 NOTE — Telephone Encounter (Signed)
-----   Message from Lizbeth BarkMandesia R Hairston, FNP sent at 11/17/2016 11:05 AM EDT ----- LDL cholesterol level is elevated and HDL cholesterol level is lowered. This can increase your irsk of heart disease overtime. You will be prescribed a pravastatin and aspirin to help lower risk. Lipid levels were elevated. This can increase your risk of heart disease overtime. Start eating a diet low in saturated fat. Limit your intake of fried foods, red meats, and whole milk. Increase activity.  Recommend follow up in 3 months.

## 2016-11-17 NOTE — Telephone Encounter (Signed)
CMA call regarding lab results   Patient did not answer but left a Vm stating the reason of the call & to call back  

## 2016-11-28 ENCOUNTER — Ambulatory Visit: Payer: Self-pay | Attending: Family Medicine | Admitting: *Deleted

## 2016-11-28 ENCOUNTER — Other Ambulatory Visit: Payer: Self-pay | Admitting: *Deleted

## 2016-11-28 VITALS — BP 146/92

## 2016-11-28 DIAGNOSIS — I10 Essential (primary) hypertension: Secondary | ICD-10-CM

## 2016-11-28 DIAGNOSIS — Z0130 Encounter for examination of blood pressure without abnormal findings: Secondary | ICD-10-CM | POA: Insufficient documentation

## 2016-11-28 MED ORDER — AMLODIPINE BESYLATE 10 MG PO TABS: 10.0000 mg | ORAL_TABLET | Freq: Every day | ORAL | 3 refills | Status: DC

## 2016-11-28 MED ORDER — HYDROCHLOROTHIAZIDE 25 MG PO TABS: 25.0000 mg | ORAL_TABLET | Freq: Every day | ORAL | 2 refills | Status: DC

## 2016-11-28 MED ORDER — LISINOPRIL 40 MG PO TABS: 40.0000 mg | ORAL_TABLET | Freq: Every day | ORAL | 2 refills | Status: DC

## 2016-11-28 MED FILL — LISINOPRIL 40 MG TABLET: 40 | 30 days supply | Qty: 30 | Fill #0

## 2016-11-28 MED FILL — AMLODIPINE BESYLATE 10 MG T: 10 | 30 days supply | Qty: 30 | Fill #0

## 2016-11-28 MED FILL — HYDROCHLOROTHIAZIDE 25 MG T: 25 | 30 days supply | Qty: 30 | Fill #0

## 2016-11-28 NOTE — Patient Instructions (Signed)
Change dose of amlodipine to 10 mg Return in 2 weeks for BP check

## 2016-11-28 NOTE — Progress Notes (Signed)
[facility]. Pt alert and oriented and arrives in good spirits. Last OV 10/31/2016. Pt here for follow up BP check.    Pt denies chest pain, SOB, HA, dizziness, or blurred vision. Verified medication. Pt states medication was taken this morning.  Manual blood pressure reading:  170/102 156/98 Dinamap:  158/95  Pt has wrist BP cuff from home:  BP reading in office with home cuff is : 160/102  Plan:  Increase Amlodipine Amlodipine 10mg  daily Recheck BP in 2 weeks.

## 2016-12-02 ENCOUNTER — Other Ambulatory Visit: Payer: Self-pay | Admitting: Family Medicine

## 2016-12-02 NOTE — Telephone Encounter (Signed)
Refill for pravastatin was already placed on 11/17/16 and [facility]

## 2016-12-02 NOTE — Telephone Encounter (Signed)
Pt came tot the office he need a refill for  pravastatin (PRAVACHOL) 20 MG tablet  He was here on 11/28/16 and did not got the refill for his med. [facility]

## 2016-12-05 MED FILL — PRAVASTATIN NA 20 MG TAB: 20 | 90 days supply | Qty: 90 | Fill #0

## 2016-12-08 NOTE — Telephone Encounter (Signed)
CMA call regarding medication refill  Patient was aware and understood   

## 2016-12-11 NOTE — Telephone Encounter (Signed)
Will route to nurse °

## 2016-12-19 ENCOUNTER — Ambulatory Visit: Payer: Self-pay | Attending: Family Medicine | Admitting: *Deleted

## 2016-12-19 VITALS — BP 147/94

## 2016-12-19 DIAGNOSIS — Z0130 Encounter for examination of blood pressure without abnormal findings: Secondary | ICD-10-CM

## 2016-12-19 DIAGNOSIS — Z029 Encounter for administrative examinations, unspecified: Secondary | ICD-10-CM | POA: Insufficient documentation

## 2016-12-19 DIAGNOSIS — I10 Essential (primary) hypertension: Secondary | ICD-10-CM

## 2016-12-19 MED ORDER — CARVEDILOL 3.125 MG PO TABS: 3.1250 mg | ORAL_TABLET | Freq: Two times a day (BID) | ORAL | 0 refills | Status: DC

## 2016-12-19 MED FILL — ?CARVEDILOL 3.125 MG TABLET: 3.125 | 30 days supply | Qty: 60 | Fill #0

## 2016-12-19 NOTE — Addendum Note (Signed)
Addended by: Guy Franco on: 12/19/2016 05:17 PM   Modules accepted: Level of Service

## 2016-12-19 NOTE — Patient Instructions (Addendum)
Start Coreg 3.125mg  twice daily Return for OV in 2 weeks with PCP

## 2016-12-19 NOTE — Progress Notes (Addendum)
Pt arrived to [facility]. Pt alert and oriented and arrives in good spirits. Last OV 10/31/2016 with PCP M.Hairston,FNP.    Pt denies chest pain, SOB, HA, dizziness, or blurred vision.  Verified medication and states medication was taken today..  Manual blood pressure reading: 152/90 and 150/90. Pt has blood pressure monitor at home at presents with readings. He take BP at home 2-3 times daily. 142/53 139/80 140/77/ 123/65 171/87 139/73 153/81 163/90 137/74 146/83 137/77 120/70 193/81 118/67 107/63 131/68 123/69  98/64 121/58 114/66 123/74 90/69  While in office home monitor readings are134/87, 163/92, 147/86,  States most of time takes medication 0630 - 0800. He does not add salt to food.  Diet consist of Lean meats, chicken, and pork loin in crock pot.  PLAN Add carvedilol  3.125 mg BID F/u in 2 weeks with PCP

## 2017-01-02 ENCOUNTER — Ambulatory Visit: Payer: Self-pay | Attending: Family Medicine | Admitting: Family Medicine

## 2017-01-02 ENCOUNTER — Encounter: Payer: Self-pay | Admitting: Family Medicine

## 2017-01-02 VITALS — BP 137/86 | HR 78 | Temp 98.0°F | Resp 18 | Ht 67.0 in | Wt 270.8 lb

## 2017-01-02 DIAGNOSIS — I10 Essential (primary) hypertension: Secondary | ICD-10-CM | POA: Insufficient documentation

## 2017-01-02 DIAGNOSIS — Z79899 Other long term (current) drug therapy: Secondary | ICD-10-CM | POA: Insufficient documentation

## 2017-01-02 DIAGNOSIS — Z7982 Long term (current) use of aspirin: Secondary | ICD-10-CM | POA: Insufficient documentation

## 2017-01-02 DIAGNOSIS — E785 Hyperlipidemia, unspecified: Secondary | ICD-10-CM | POA: Insufficient documentation

## 2017-01-02 MED ORDER — HYDROCHLOROTHIAZIDE 25 MG PO TABS: 25.0000 mg | ORAL_TABLET | Freq: Every day | ORAL | 2 refills | Status: DC

## 2017-01-02 MED ORDER — ASPIRIN EC 81 MG PO TBEC: 81.0000 mg | DELAYED_RELEASE_TABLET | Freq: Every day | ORAL | 3 refills | Status: DC

## 2017-01-02 MED ORDER — AMLODIPINE BESYLATE 10 MG PO TABS: 10.0000 mg | ORAL_TABLET | Freq: Every day | ORAL | 3 refills | Status: DC

## 2017-01-02 MED ORDER — PRAVASTATIN SODIUM 20 MG PO TABS: 20.0000 mg | ORAL_TABLET | Freq: Every day | ORAL | 0 refills | Status: DC

## 2017-01-02 MED ORDER — LISINOPRIL 40 MG PO TABS: 40.0000 mg | ORAL_TABLET | Freq: Every day | ORAL | 2 refills | Status: DC

## 2017-01-02 MED ORDER — CARVEDILOL 6.25 MG PO TABS: 6.2500 mg | ORAL_TABLET | Freq: Two times a day (BID) | ORAL | 2 refills | Status: DC

## 2017-01-02 MED FILL — AMLODIPINE BESYLATE 10 MG T: 10 | 30 days supply | Qty: 30 | Fill #1

## 2017-01-02 MED FILL — ?CARVEDILOL 6.25 MG TABLET: 6.25 | 30 days supply | Qty: 60 | Fill #0

## 2017-01-02 MED FILL — HYDROCHLOROTHIAZIDE 25 MG T: 25 | 30 days supply | Qty: 30 | Fill #1

## 2017-01-02 MED FILL — LISINOPRIL 40 MG TABLET: 40 | 30 days supply | Qty: 30 | Fill #1

## 2017-01-02 NOTE — Patient Instructions (Signed)
DASH Eating Plan DASH stands for "Dietary Approaches to Stop Hypertension." The DASH eating plan is a healthy eating plan that has been shown to reduce high blood pressure (hypertension). It may also reduce your risk for type 2 diabetes, heart disease, and stroke. The DASH eating plan may also help with weight loss. What are tips for following this plan? General guidelines  Avoid eating more than 2,300 mg (milligrams) of salt (sodium) a day. If you have hypertension, you may need to reduce your sodium intake to 1,500 mg a day.  Limit alcohol intake to no more than 1 drink a day for nonpregnant women and 2 drinks a day for men. One drink equals 12 oz of beer, 5 oz of wine, or 1 oz of hard liquor.  Work with your health care provider to maintain a healthy body weight or to lose weight. Ask what an ideal weight is for you.  Get at least 30 minutes of exercise that causes your heart to beat faster (aerobic exercise) most days of the week. Activities may include walking, swimming, or biking.  Work with your health care provider or diet and nutrition specialist (dietitian) to adjust your eating plan to your individual calorie needs. Reading food labels  Check food labels for the amount of sodium per serving. Choose foods with less than 5 percent of the Daily Value of sodium. Generally, foods with less than 300 mg of sodium per serving fit into this eating plan.  To find whole grains, look for the word "whole" as the first word in the ingredient list. Shopping  Buy products labeled as "low-sodium" or "no salt added."  Buy fresh foods. Avoid canned foods and premade or frozen meals. Cooking  Avoid adding salt when cooking. Use salt-free seasonings or herbs instead of table salt or sea salt. Check with your health care provider or pharmacist before using salt substitutes.  Do not fry foods. Cook foods using healthy methods such as baking, boiling, grilling, and broiling instead.  Cook with  heart-healthy oils, such as olive, canola, soybean, or sunflower oil. Meal planning   Eat a balanced diet that includes: ? 5 or more servings of fruits and vegetables each day. At each meal, try to fill half of your plate with fruits and vegetables. ? Up to 6-8 servings of whole grains each day. ? Less than 6 oz of lean meat, poultry, or fish each day. A 3-oz serving of meat is about the same size as a deck of cards. One egg equals 1 oz. ? 2 servings of low-fat dairy each day. ? A serving of nuts, seeds, or beans 5 times each week. ? Heart-healthy fats. Healthy fats called Omega-3 fatty acids are found in foods such as flaxseeds and coldwater fish, like sardines, salmon, and mackerel.  Limit how much you eat of the following: ? Canned or prepackaged foods. ? Food that is high in trans fat, such as fried foods. ? Food that is high in saturated fat, such as fatty meat. ? Sweets, desserts, sugary drinks, and other foods with added sugar. ? Full-fat dairy products.  Do not salt foods before eating.  Try to eat at least 2 vegetarian meals each week.  Eat more home-cooked food and less restaurant, buffet, and fast food.  When eating at a restaurant, ask that your food be prepared with less salt or no salt, if possible. What foods are recommended? The items listed may not be a complete list. Talk with your dietitian about what   dietary choices are best for you. Grains Whole-grain or whole-wheat bread. Whole-grain or whole-wheat pasta. Brown rice. Oatmeal. Quinoa. Bulgur. Whole-grain and low-sodium cereals. Pita bread. Low-fat, low-sodium crackers. Whole-wheat flour tortillas. Vegetables Fresh or frozen vegetables (raw, steamed, roasted, or grilled). Low-sodium or reduced-sodium tomato and vegetable juice. Low-sodium or reduced-sodium tomato sauce and tomato paste. Low-sodium or reduced-sodium canned vegetables. Fruits All fresh, dried, or frozen fruit. Canned fruit in natural juice (without  added sugar). Meat and other protein foods Skinless chicken or turkey. Ground chicken or turkey. Pork with fat trimmed off. Fish and seafood. Egg whites. Dried beans, peas, or lentils. Unsalted nuts, nut butters, and seeds. Unsalted canned beans. Lean cuts of beef with fat trimmed off. Low-sodium, lean deli meat. Dairy Low-fat (1%) or fat-free (skim) milk. Fat-free, low-fat, or reduced-fat cheeses. Nonfat, low-sodium ricotta or cottage cheese. Low-fat or nonfat yogurt. Low-fat, low-sodium cheese. Fats and oils Soft margarine without trans fats. Vegetable oil. Low-fat, reduced-fat, or light mayonnaise and salad dressings (reduced-sodium). Canola, safflower, olive, soybean, and sunflower oils. Avocado. Seasoning and other foods Herbs. Spices. Seasoning mixes without salt. Unsalted popcorn and pretzels. Fat-free sweets. What foods are not recommended? The items listed may not be a complete list. Talk with your dietitian about what dietary choices are best for you. Grains Baked goods made with fat, such as croissants, muffins, or some breads. Dry pasta or rice meal packs. Vegetables Creamed or fried vegetables. Vegetables in a cheese sauce. Regular canned vegetables (not low-sodium or reduced-sodium). Regular canned tomato sauce and paste (not low-sodium or reduced-sodium). Regular tomato and vegetable juice (not low-sodium or reduced-sodium). Pickles. Olives. Fruits Canned fruit in a light or heavy syrup. Fried fruit. Fruit in cream or butter sauce. Meat and other protein foods Fatty cuts of meat. Ribs. Fried meat. Bacon. Sausage. Bologna and other processed lunch meats. Salami. Fatback. Hotdogs. Bratwurst. Salted nuts and seeds. Canned beans with added salt. Canned or smoked fish. Whole eggs or egg yolks. Chicken or turkey with skin. Dairy Whole or 2% milk, cream, and half-and-half. Whole or full-fat cream cheese. Whole-fat or sweetened yogurt. Full-fat cheese. Nondairy creamers. Whipped toppings.  Processed cheese and cheese spreads. Fats and oils Butter. Stick margarine. Lard. Shortening. Ghee. Bacon fat. Tropical oils, such as coconut, palm kernel, or palm oil. Seasoning and other foods Salted popcorn and pretzels. Onion salt, garlic salt, seasoned salt, table salt, and sea salt. Worcestershire sauce. Tartar sauce. Barbecue sauce. Teriyaki sauce. Soy sauce, including reduced-sodium. Steak sauce. Canned and packaged gravies. Fish sauce. Oyster sauce. Cocktail sauce. Horseradish that you find on the shelf. Ketchup. Mustard. Meat flavorings and tenderizers. Bouillon cubes. Hot sauce and Tabasco sauce. Premade or packaged marinades. Premade or packaged taco seasonings. Relishes. Regular salad dressings. Where to find more information:  National Heart, Lung, and Blood Institute: www.nhlbi.nih.gov  American Heart Association: www.heart.org Summary  The DASH eating plan is a healthy eating plan that has been shown to reduce high blood pressure (hypertension). It may also reduce your risk for type 2 diabetes, heart disease, and stroke.  With the DASH eating plan, you should limit salt (sodium) intake to 2,300 mg a day. If you have hypertension, you may need to reduce your sodium intake to 1,500 mg a day.  When on the DASH eating plan, aim to eat more fresh fruits and vegetables, whole grains, lean proteins, low-fat dairy, and heart-healthy fats.  Work with your health care provider or diet and nutrition specialist (dietitian) to adjust your eating plan to your individual   calorie needs. This information is not intended to replace advice given to you by your health care provider. Make sure you discuss any questions you have with your health care provider. Document Released: 04/10/2011 Document Revised: 04/14/2016 Document Reviewed: 04/14/2016 Elsevier Interactive Patient Education  2017 Elsevier Inc.  

## 2017-01-02 NOTE — Progress Notes (Signed)
Subjective:  Patient ID: Scott Mcmillan, male    DOB: [birth year]  Age: 56 y.o. MRN: [id number]  CC: Follow-up   HPI Scott EvertsMark Cleckler presents for follow-up of elevated blood pressure. History of HLD and HTN. He is not exercising and is not adherent to low salt diet.  Blood pressure is well controlled at home. Cardiac symptoms none. Patient denies chest pain, chest pressure/discomfort, claudication, dyspnea, lower extremity edema, near-syncope, palpitations and syncope. Cardiovascular risk factors: advanced age (older than 1255 for men, 2665 for women), hypertension, male gender, obesity (BMI >= 30 kg/m2) and sedentary lifestyle. Use of agents associated with hypertension: none. History of target organ damage: none.      Outpatient Medications Prior to Visit  Medication Sig Dispense Refill  . amLODipine (NORVASC) 10 MG tablet Take 1 tablet (10 mg total) by mouth daily. 90 tablet 3  . aspirin EC 81 MG tablet Take 1 tablet (81 mg total) by mouth daily. 90 tablet 3  . carvedilol (COREG) 3.125 MG tablet Take 1 tablet (3.125 mg total) by mouth 2 (two) times daily with a meal. 60 tablet 0  . hydrochlorothiazide (HYDRODIURIL) 25 MG tablet Take 1 tablet (25 mg total) by mouth daily. 30 tablet 2  . lisinopril (PRINIVIL,ZESTRIL) 40 MG tablet Take 1 tablet (40 mg total) by mouth daily. 30 tablet 2  . pravastatin (PRAVACHOL) 20 MG tablet Take 1 tablet (20 mg total) by mouth daily. 90 tablet 0   No facility-administered medications prior to visit.     ROS Review of Systems  Constitutional: Negative.   Eyes: Negative.   Respiratory: Negative.   Cardiovascular: Negative.   Gastrointestinal: Negative.   Genitourinary: Negative.   Musculoskeletal: Negative.   Skin: Negative.   Neurological: Negative.    Objective:  BP 137/86   Pulse 78   Temp 98 F (36.7 C) (Oral)   Resp 18   Ht 5\' 7"  (1.702 m)   Wt 270 lb 12.8 oz (122.8 kg)   SpO2 96%   BMI 42.41 kg/m   BP/Weight 01/02/2017 12/19/2016  11/28/2016  Systolic BP 137 147 146  Diastolic BP 86 94 92  Wt. (Lbs) 270.8 - -  BMI 42.41 - -   Physical Exam  Constitutional: He appears well-developed and well-nourished.  Eyes: Pupils are equal, round, and reactive to light. Conjunctivae are normal.  Neck: Normal range of motion. Neck supple. No JVD present.  Cardiovascular: Normal rate, regular rhythm, normal heart sounds and intact distal pulses.   Pulmonary/Chest: Effort normal and breath sounds normal.  Abdominal: Soft. Bowel sounds are normal. There is no tenderness.  Skin: Skin is warm and dry.  Nursing note and vitals reviewed.   Assessment & Plan:   Problem List Items Addressed This Visit      Cardiovascular and Mediastinum   Hypertension - Primary   Schedule BP recheck in 2 weeks with clinic RN.   If BP is greater than 90/60 (MAP 65 or greater) but not less than 130/80 may increase dose of coreg to  12.5 mg BID to recheck in another 2 weeks with clinicRN.    Follow up with PCP ib 3 months.    Relevant Medications   carvedilol (COREG) 6.25 MG tablet   lisinopril (PRINIVIL,ZESTRIL) 40 MG tablet   hydrochlorothiazide (HYDRODIURIL) 25 MG tablet   pravastatin (PRAVACHOL) 20 MG tablet   amLODipine (NORVASC) 10 MG tablet   aspirin EC 81 MG tablet     Other   Hyperlipidemia with target low density  lipoprotein (LDL) cholesterol less than 100 mg/dL   Relevant Medications   pravastatin (PRAVACHOL) 20 MG tablet   aspirin EC 81 MG tablet      Meds ordered this encounter  Medications  . carvedilol (COREG) 6.25 MG tablet    Sig: Take 1 tablet (6.25 mg total) by mouth 2 (two) times daily with a meal.    Dispense:  60 tablet    Refill:  2    Order Specific Question:   Supervising Provider    Answer:   Quentin Angst [id number]  . lisinopril (PRINIVIL,ZESTRIL) 40 MG tablet    Sig: Take 1 tablet (40 mg total) by mouth daily.    Dispense:  30 tablet    Refill:  2    Order Specific Question:   Supervising Provider     Answer:   Quentin Angst [id number]  . hydrochlorothiazide (HYDRODIURIL) 25 MG tablet    Sig: Take 1 tablet (25 mg total) by mouth daily.    Dispense:  30 tablet    Refill:  2    Order Specific Question:   Supervising Provider    Answer:   Quentin Angst [id number]  . pravastatin (PRAVACHOL) 20 MG tablet    Sig: Take 1 tablet (20 mg total) by mouth daily.    Dispense:  90 tablet    Refill:  0    Order Specific Question:   Supervising Provider    Answer:   Quentin Angst [id number]  . amLODipine (NORVASC) 10 MG tablet    Sig: Take 1 tablet (10 mg total) by mouth daily.    Dispense:  90 tablet    Refill:  3    Order Specific Question:   Supervising Provider    Answer:   Quentin Angst [id number]  . aspirin EC 81 MG tablet    Sig: Take 1 tablet (81 mg total) by mouth daily.    Dispense:  90 tablet    Refill:  3    Order Specific Question:   Supervising Provider    Answer:   Quentin Angst [id number]    Follow-up: Return in about 2 months (around 03/04/2017) for BP check with Travia.   Lizbeth Bark FNP

## 2017-01-02 NOTE — Progress Notes (Signed)
Patient is here for BP f/up

## 2017-02-13 MED FILL — ?CARVEDILOL 6.25 MG TABLET: 6.25 | 30 days supply | Qty: 60 | Fill #1

## 2017-02-13 MED FILL — LISINOPRIL 40 MG TABLET: 40 | 30 days supply | Qty: 30 | Fill #2

## 2017-02-13 MED FILL — AMLODIPINE BESYLATE 10 MG T: 10 | 30 days supply | Qty: 30 | Fill #2

## 2017-02-13 MED FILL — HYDROCHLOROTHIAZIDE 25 MG T: 25 | 30 days supply | Qty: 30 | Fill #2

## 2017-02-25 MED FILL — PRAVASTATIN NA 20 MG TAB: 20 | 30 days supply | Qty: 30 | Fill #0

## 2017-03-03 ENCOUNTER — Ambulatory Visit: Payer: Self-pay | Attending: Family Medicine | Admitting: Pharmacist

## 2017-03-03 DIAGNOSIS — I10 Essential (primary) hypertension: Secondary | ICD-10-CM | POA: Insufficient documentation

## 2017-03-03 DIAGNOSIS — Z79899 Other long term (current) drug therapy: Secondary | ICD-10-CM | POA: Insufficient documentation

## 2017-03-03 MED ORDER — CARVEDILOL 12.5 MG PO TABS: 12.5000 mg | ORAL_TABLET | Freq: Two times a day (BID) | ORAL | 2 refills | Status: DC

## 2017-03-03 MED FILL — ?CARVEDILOL 12.5 MG TABLET: 12.5 | 30 days supply | Qty: 60 | Fill #0

## 2017-03-03 NOTE — Patient Instructions (Signed)
Thanks for coming to see Scott Mcmillan!  Increase carvedilol to 12.5 mg twice daily  Come back in 2 weeks for nurse visit blood pressure

## 2017-03-03 NOTE — Progress Notes (Signed)
   S:    Patient arrives in good spirits.  Presents to the clinic for hypertension evaluation.   Patient reports adherence with medications. Last doses were this morning.  Current BP Medications include:  Lisinopril 40 mg daily, carvedilol 6.25 mg BID, hydrochlorothiazide 25 mg daily, amlodipine 10 mg daily.   He monitors his blood pressures at home and they have ranged from 140s-160s/80s-100s  O:   Last 3 Office BP readings: BP Readings from Last 3 Encounters:  03/03/17 (!) 167/110  01/02/17 137/86  12/19/16 (!) 147/94    BMET    Component Value Date/Time   NA 138 10/31/2016 0905   K 3.7 10/31/2016 0905   CL 96 10/31/2016 0905   CO2 25 10/31/2016 0905   GLUCOSE 96 10/31/2016 0905   GLUCOSE 150 (H) 09/23/2016 0953   BUN 14 10/31/2016 0905   CREATININE 1.00 10/31/2016 0905   CALCIUM 9.9 10/31/2016 0905   GFRNONAA 83 10/31/2016 0905   GFRAA 95 10/31/2016 0905    A/P: Hypertension longstanding currently uncontrolled on current medications (Goal <130/80).  Continued all medications and increased carvedilol to 12.5 mg BID per PCP's instructions. Patient will follow up in 2 weeks for nurse visit for BP check as directed.Marland Kitchen.   Results reviewed and written information provided.   Total time in face-to-face counseling 10 minutes.   Patient seen with Theodoro Kosasheed Mohammed, PharmD Candidate

## 2017-03-05 ENCOUNTER — Ambulatory Visit: Payer: Self-pay

## 2017-03-16 MED FILL — AMLODIPINE BESYLATE 10 MG T: 10 | 30 days supply | Qty: 30 | Fill #3

## 2017-03-19 ENCOUNTER — Other Ambulatory Visit: Payer: Self-pay | Admitting: *Deleted

## 2017-03-19 ENCOUNTER — Ambulatory Visit: Payer: Self-pay | Attending: Family Medicine | Admitting: *Deleted

## 2017-03-19 ENCOUNTER — Ambulatory Visit: Payer: Self-pay

## 2017-03-19 VITALS — BP 120/80 | HR 74

## 2017-03-19 DIAGNOSIS — I10 Essential (primary) hypertension: Secondary | ICD-10-CM

## 2017-03-19 DIAGNOSIS — E785 Hyperlipidemia, unspecified: Secondary | ICD-10-CM

## 2017-03-19 MED ORDER — LISINOPRIL 40 MG PO TABS: 40.0000 mg | ORAL_TABLET | Freq: Every day | ORAL | 1 refills | Status: DC

## 2017-03-19 MED ORDER — HYDROCHLOROTHIAZIDE 25 MG PO TABS: 25.0000 mg | ORAL_TABLET | Freq: Every day | ORAL | 1 refills | Status: DC

## 2017-03-19 MED ORDER — PRAVASTATIN SODIUM 20 MG PO TABS: 20.0000 mg | ORAL_TABLET | Freq: Every day | ORAL | 1 refills | Status: DC

## 2017-03-19 MED FILL — LISINOPRIL 40 MG TAB: 40 | 30 days supply | Qty: 30 | Fill #0

## 2017-03-19 MED FILL — HYDROCHLOROTHIAZIDE 25 MG T: 25 | 30 days supply | Qty: 30 | Fill #0

## 2017-03-19 NOTE — Progress Notes (Signed)
[facility] alert and oriented.  Pt denies chest pain, SOB, HA, dizziness, or blurred vision.  Reviewed medication with patient. Pt states medication was taken this morning. He request refills on Lisinopril, HCTZ, and Pravastatin.   Manual blood pressure reading: 130/80 and 120/80

## 2017-03-31 MED FILL — PRAVASTATIN NA 20 MG TAB: 20 | 30 days supply | Qty: 30 | Fill #0

## 2017-04-16 MED FILL — AMLODIPINE BESYLATE 10 MG T: 10 | 30 days supply | Qty: 30 | Fill #4

## 2017-04-16 MED FILL — ?CARVEDILOL 12.5 MG TABLET: 12.5 | 30 days supply | Qty: 60 | Fill #1

## 2017-04-16 MED FILL — LISINOPRIL 40 MG TAB: 40 | 30 days supply | Qty: 30 | Fill #1

## 2017-04-16 MED FILL — HYDROCHLOROTHIAZIDE 25 MG T: 25 | 30 days supply | Qty: 30 | Fill #1

## 2017-04-17 ENCOUNTER — Encounter: Payer: Self-pay | Admitting: Family Medicine

## 2017-04-17 ENCOUNTER — Ambulatory Visit: Payer: Self-pay | Attending: Family Medicine | Admitting: Family Medicine

## 2017-04-17 VITALS — BP 132/82 | HR 73 | Temp 97.6°F | Resp 18 | Ht 68.0 in | Wt 286.4 lb

## 2017-04-17 DIAGNOSIS — Z7982 Long term (current) use of aspirin: Secondary | ICD-10-CM | POA: Insufficient documentation

## 2017-04-17 DIAGNOSIS — Z79899 Other long term (current) drug therapy: Secondary | ICD-10-CM | POA: Insufficient documentation

## 2017-04-17 DIAGNOSIS — S025XXA Fracture of tooth (traumatic), initial encounter for closed fracture: Secondary | ICD-10-CM

## 2017-04-17 DIAGNOSIS — K0889 Other specified disorders of teeth and supporting structures: Secondary | ICD-10-CM | POA: Insufficient documentation

## 2017-04-17 DIAGNOSIS — Z9181 History of falling: Secondary | ICD-10-CM | POA: Insufficient documentation

## 2017-04-17 DIAGNOSIS — K047 Periapical abscess without sinus: Secondary | ICD-10-CM | POA: Insufficient documentation

## 2017-04-17 DIAGNOSIS — Z0000 Encounter for general adult medical examination without abnormal findings: Secondary | ICD-10-CM

## 2017-04-17 DIAGNOSIS — S025XXS Fracture of tooth (traumatic), sequela: Secondary | ICD-10-CM | POA: Insufficient documentation

## 2017-04-17 DIAGNOSIS — E785 Hyperlipidemia, unspecified: Secondary | ICD-10-CM | POA: Insufficient documentation

## 2017-04-17 MED ORDER — IBUPROFEN 800 MG PO TABS: 800.0000 mg | ORAL_TABLET | Freq: Three times a day (TID) | ORAL | 0 refills | Status: DC | PRN

## 2017-04-17 MED ORDER — PENICILLIN V POTASSIUM 500 MG PO TABS: 500.0000 mg | ORAL_TABLET | Freq: Four times a day (QID) | ORAL | 0 refills | Status: DC

## 2017-04-17 MED ORDER — PRAVASTATIN SODIUM 20 MG PO TABS: 20.0000 mg | ORAL_TABLET | Freq: Every day | ORAL | 2 refills | Status: DC

## 2017-04-17 MED FILL — IBUPROFEN 800 MG TABLET: 800 | 10 days supply | Qty: 30 | Fill #0

## 2017-04-17 MED FILL — ?PENICILLIN VK 500MG TABLET: 500 | 5 days supply | Qty: 20 | Fill #0

## 2017-04-17 NOTE — Progress Notes (Signed)
Subjective:  Patient ID: Scott Mcmillan, male    DOB: 06/30/1960  Age: 56 y.o. MRN: [id number]  CC: Dental Pain   HPI Scott EvertsMark Gracie presents for dental problem.  He reports onset of dental pain 3 days ago that began mild but has gradually increased.  He reports history of falling 1 year ago and breaking his teeth.  He denies any fevers, facial swelling, tongue swelling, or drainage.   Outpatient Medications Prior to Visit  Medication Sig Dispense Refill  . amLODipine (NORVASC) 10 MG tablet Take 1 tablet (10 mg total) by mouth daily. 90 tablet 3  . aspirin EC 81 MG tablet Take 1 tablet (81 mg total) by mouth daily. 90 tablet 3  . carvedilol (COREG) 12.5 MG tablet Take 1 tablet (12.5 mg total) by mouth 2 (two) times daily with a meal. 60 tablet 2  . hydrochlorothiazide (HYDRODIURIL) 25 MG tablet Take 1 tablet (25 mg total) daily by mouth. 30 tablet 1  . lisinopril (PRINIVIL,ZESTRIL) 40 MG tablet Take 1 tablet (40 mg total) daily by mouth. 30 tablet 1  . pravastatin (PRAVACHOL) 20 MG tablet Take 1 tablet (20 mg total) daily by mouth. 30 tablet 1   No facility-administered medications prior to visit.    Review of Systems  Constitutional: Negative.   HENT:       Dental problem  Respiratory: Negative.   Cardiovascular: Negative.        Objective:  BP 132/82 (BP Location: Left Arm, Patient Position: Sitting, Cuff Size: Normal)   Pulse 73   Temp 97.6 F (36.4 C) (Oral)   Resp 18   Ht 5\' 8"  (1.727 m)   Wt 286 lb 6.4 oz (129.9 kg)   SpO2 96%   BMI 43.55 kg/m   BP/Weight 04/17/2017 03/19/2017 03/03/2017  Systolic BP 132 120 167  Diastolic BP 82 80 110  Wt. (Lbs) 286.4 - -  BMI 43.55 - -   Physical Exam  Constitutional: He is well-developed, well-nourished, and in no distress.  HENT:  Head: Normocephalic and atraumatic.  Right Ear: External ear normal.  Left Ear: External ear normal.  Mouth/Throat: Abnormal dentition (right-sided, broken upper molars and premolars). Dental  abscesses (right upper molar) and dental caries present.  Cardiovascular: Normal rate, regular rhythm, normal heart sounds and intact distal pulses.  Pulmonary/Chest: Effort normal and breath sounds normal.  Nursing note and vitals reviewed.   Assessment & Plan:   1. Dental abscess  - penicillin v potassium (VEETID) 500 MG tablet; Take 1 tablet (500 mg total) by mouth 4 (four) times daily.  Dispense: 20 tablet; Refill: 0 - ibuprofen (ADVIL,MOTRIN) 800 MG tablet; Take 1 tablet (800 mg total) by mouth every 8 (eight) hours as needed. Take with food.  Dispense: 30 tablet; Refill: 0  2. Open fracture of tooth, sequela  - Ambulatory referral to Dentistry - ibuprofen (ADVIL,MOTRIN) 800 MG tablet; Take 1 tablet (800 mg total) by mouth every 8 (eight) hours as needed. Take with food.  Dispense: 30 tablet; Refill: 0  3. Hyperlipidemia with target low density lipoprotein (LDL) cholesterol less than 100 mg/dL  - pravastatin (PRAVACHOL) 20 MG tablet; Take 1 tablet (20 mg total) by mouth daily.  Dispense: 30 tablet; Refill: 2  4. Healthcare maintenance  - Ambulatory referral to Gastroenterology - Tdap vaccine greater than or equal to 7yo IM     Follow-up: Return in about 3 months (around 07/16/2017), or if symptoms worsen or fail to improve, for HTN/HLD.   Deitra Craine  Sandria Bales FNP

## 2017-04-17 NOTE — Progress Notes (Signed)
Patient is here for tooth pain

## 2017-04-17 NOTE — Patient Instructions (Signed)
Dental Abscess A dental abscess is pus in or around a tooth. Follow these instructions at home:  Take medicines only as told by your dentist.  If you were prescribed antibiotic medicine, finish all of it even if you start to feel better.  Rinse your mouth (gargle) often with salt water.  Do not drive or use heavy machinery, like a lawn mower, while taking pain medicine.  Do not apply heat to the outside of your mouth.  Keep all follow-up visits as told by your dentist. This is important. Contact a doctor if:  Your pain is worse, and medicine does not help. Get help right away if:  You have a fever or chills.  Your symptoms suddenly get worse.  You have a very bad headache.  You have problems breathing or swallowing.  You have trouble opening your mouth.  You have puffiness (swelling) in your neck or around your eye. This information is not intended to replace advice given to you by your health care provider. Make sure you discuss any questions you have with your health care provider. Document Released: 09/05/2014 Document Revised: 09/27/2015 Document Reviewed: 04/18/2014 Elsevier Interactive Patient Education  2018 [employer] Inc.  

## 2017-04-20 ENCOUNTER — Encounter: Payer: Self-pay | Admitting: Internal Medicine

## 2017-05-05 HISTORY — PX: COLONOSCOPY: SHX174

## 2017-05-14 MED FILL — PRAVASTATIN NA 20 MG TAB: 20 | 30 days supply | Qty: 30 | Fill #1

## 2017-05-14 MED FILL — HYDROCHLOROTHIAZIDE 25 MG T: 25 | 30 days supply | Qty: 30 | Fill #0

## 2017-05-14 MED FILL — LISINOPRIL 40 MG TAB: 40 | 30 days supply | Qty: 30 | Fill #0

## 2017-05-14 MED FILL — ?CARVEDILOL 12.5 MG TABLET: 12.5 | 30 days supply | Qty: 60 | Fill #2

## 2017-05-14 MED FILL — AMLODIPINE BESYLATE 10 MG T: 10 | 30 days supply | Qty: 30 | Fill #5

## 2017-05-25 ENCOUNTER — Ambulatory Visit: Payer: Self-pay | Attending: Family Medicine

## 2017-06-02 ENCOUNTER — Ambulatory Visit (AMBULATORY_SURGERY_CENTER): Payer: Self-pay

## 2017-06-02 ENCOUNTER — Other Ambulatory Visit: Payer: Self-pay

## 2017-06-02 VITALS — Ht 68.0 in | Wt 292.2 lb

## 2017-06-02 DIAGNOSIS — Z1211 Encounter for screening for malignant neoplasm of colon: Secondary | ICD-10-CM

## 2017-06-02 MED ORDER — PEG 3350-KCL-NA BICARB-NACL 420 G PO SOLR: 4000.0000 mL | Freq: Once | ORAL | 0 refills | Status: AC

## 2017-06-02 NOTE — Progress Notes (Signed)
Denies allergies to eggs or soy products. Denies complication of anesthesia or sedation. Denies use of weight loss medication. Denies use of O2.   Emmi instructions given for colonoscopy.  

## 2017-06-10 MED FILL — NULYTELY WITH FLAVOR PACKS: 420 | 1 days supply | Qty: 4000 | Fill #0

## 2017-06-16 ENCOUNTER — Encounter: Payer: Self-pay | Admitting: Internal Medicine

## 2017-06-16 ENCOUNTER — Ambulatory Visit (AMBULATORY_SURGERY_CENTER): Payer: Self-pay | Admitting: Internal Medicine

## 2017-06-16 VITALS — BP 153/79 | HR 62 | Temp 97.5°F | Resp 14 | Ht 68.0 in | Wt 286.0 lb

## 2017-06-16 DIAGNOSIS — K639 Disease of intestine, unspecified: Secondary | ICD-10-CM

## 2017-06-16 DIAGNOSIS — K6389 Other specified diseases of intestine: Secondary | ICD-10-CM

## 2017-06-16 DIAGNOSIS — Z1211 Encounter for screening for malignant neoplasm of colon: Secondary | ICD-10-CM

## 2017-06-16 MED ORDER — SODIUM CHLORIDE 0.9 % IV SOLN: 500.0000 mL | Freq: Once | INTRAVENOUS | Status: DC

## 2017-06-16 NOTE — Progress Notes (Signed)
Called to room to assist during endoscopic procedure.  Patient ID and intended procedure confirmed with present staff. Received instructions for my participation in the procedure from the performing physician.  

## 2017-06-16 NOTE — Op Note (Signed)
[facility] Patient Name: Scott Mcmillan Procedure Date: 06/16/2017 11:18 AM MRN: [id number] Endoscopist: Beverley Fiedler , MD Age: 57 Referring MD:  Date of Birth: 1961-04-22 Gender: Male Account #: [id number] Procedure:                Colonoscopy Indications:              Screening for colorectal malignant neoplasm, This                            is the patient's first colonoscopy Medicines:                Monitored Anesthesia Care Procedure:                Pre-Anesthesia Assessment:                           - Prior to the procedure, a History and Physical                            was performed, and patient medications and                            allergies were reviewed. The patient's tolerance of                            previous anesthesia was also reviewed. The risks                            and benefits of the procedure and the sedation                            options and risks were discussed with the patient.                            All questions were answered, and informed consent                            was obtained. Prior Anticoagulants: The patient has                            taken no previous anticoagulant or antiplatelet                            agents. ASA Grade Assessment: II - A patient with                            mild systemic disease. After reviewing the risks                            and benefits, the patient was deemed in                            satisfactory condition to undergo the procedure.  After obtaining informed consent, the colonoscope                            was passed under direct vision. Throughout the                            procedure, the patient's blood pressure, pulse, and                            oxygen saturations were monitored continuously. The                            Colonoscope was introduced through the anus and                            advanced to the the cecum,  identified by                            appendiceal orifice and ileocecal valve. The                            colonoscopy was performed without difficulty. The                            patient tolerated the procedure well. The quality                            of the bowel preparation was good. The ileocecal                            valve, appendiceal orifice, and rectum were                            photographed. Scope In: 11:27:48 AM Scope Out: 11:48:38 AM Scope Withdrawal Time: 0 hours 18 minutes 48 seconds  Total Procedure Duration: 0 hours 20 minutes 50 seconds  Findings:                 The digital rectal exam was normal.                           A large polypoid lesion was found in the cecum. The                            lesion was submucosal. No bleeding was present.                            This was soft when pressure was applied with the                            forceps and had a pillow-sign. Multiple tunneled                            biopsies were obtained with cold forceps for  histology in a targeted manner.                           Multiple small-mouthed diverticula were found in                            the sigmoid colon and descending colon.                           Internal hemorrhoids were found during                            retroflexion. The hemorrhoids were small. Complications:            No immediate complications. Estimated Blood Loss:     Estimated blood loss was minimal. Impression:               - Submucosal lesion in the cecum. Biopsied.                           - Diverticulosis in the sigmoid colon and in the                            descending colon.                           - Internal hemorrhoids. Recommendation:           - Patient has a contact number available for                            emergencies. The signs and symptoms of potential                            delayed complications were  discussed with the                            patient. Return to normal activities tomorrow.                            Written discharge instructions were provided to the                            patient.                           - Resume previous diet.                           - Continue present medications.                           - Await pathology results.                           - Repeat colonoscopy is recommended. The  colonoscopy date will be determined after pathology                            results from today's exam become available for                            review. Beverley Fiedler, MD 06/16/2017 11:54:22 AM This report has been signed electronically.

## 2017-06-16 NOTE — Progress Notes (Signed)
Spontaneous respirations throughout. VSS. Resting comfortably. To PACU on room air. Report to  RN. 

## 2017-06-16 NOTE — Progress Notes (Signed)
No problems noted in the recovery room. Maw  Dr. Rhea BeltonPyrtle spoke with pt and his mother about hemorrhoid banding procedure.  Per Dr. Rhea BeltonPyrtle he will call pt about the pathology findings and he will discuss with pt if he is interested in setting up appointment for banding at that time. maw

## 2017-06-16 NOTE — Patient Instructions (Addendum)
YOU HAD AN ENDOSCOPIC PROCEDURE TODAY AT THE [facility]:   Refer to the procedure report that was given to you for any specific questions about what was found during the examination.  If the procedure report does not answer your questions, please call your gastroenterologist to clarify.  If you requested that your care partner not be given the details of your procedure findings, then the procedure report has been included in a sealed envelope for you to review at your convenience later.  YOU SHOULD EXPECT: Some feelings of bloating in the abdomen. Passage of more gas than usual.  Walking can help get rid of the air that was put into your GI tract during the procedure and reduce the bloating. If you had a lower endoscopy (such as a colonoscopy or flexible sigmoidoscopy) you may notice spotting of blood in your stool or on the toilet paper. If you underwent a bowel prep for your procedure, you may not have a normal bowel movement for a few days.  Please Note:  You might notice some irritation and congestion in your nose or some drainage.  This is from the oxygen used during your procedure.  There is no need for concern and it should clear up in a day or so.  SYMPTOMS TO REPORT IMMEDIATELY:   Following lower endoscopy (colonoscopy or flexible sigmoidoscopy):  Excessive amounts of blood in the stool  Significant tenderness or worsening of abdominal pains  Swelling of the abdomen that is new, acute  Fever of 100F or higher   For urgent or emergent issues, a gastroenterologist can be reached at any hour by calling [phone].   DIET:  We do recommend a small meal at first, but then you may proceed to your regular diet.  Drink plenty of fluids but you should avoid alcoholic beverages for 24 hours.  ACTIVITY:  You should plan to take it easy for the rest of today and you should NOT DRIVE or use heavy machinery until tomorrow (because of the sedation medicines used during the test).     FOLLOW UP: Our staff will call the number listed on your records the next business day following your procedure to check on you and address any questions or concerns that you may have regarding the information given to you following your procedure. If we do not reach you, we will leave a message.  However, if you are feeling well and you are not experiencing any problems, there is no need to return our call.  We will assume that you have returned to your regular daily activities without incident.  If any biopsies were taken you will be contacted by phone or by letter within the next 1-3 weeks.  Please call us at [phone] if you have not heard about the biopsies in 3 weeks.    SIGNATURES/CONFIDENTIALITY: You and/or your care partner have signed paperwork which will be entered into your electronic medical record.  These signatures attest to the fact that that the information above on your After Visit Summary has been reviewed and is understood.  Full responsibility of the confidentiality of this discharge information lies with you and/or your care-partner.    Handouts were given to your care partner on  Diverticulosis Hemorrhoids Banding info, and hemorrhoids. You may resume your current medications today. Await biopsy results. Please call if any questions or concerns.

## 2017-06-17 ENCOUNTER — Telehealth: Payer: Self-pay

## 2017-06-17 NOTE — Telephone Encounter (Signed)
  Follow up Call-  Call back number 06/16/2017  Post procedure Call Back phone  # [phone]  Permission to leave phone message Yes     Patient questions:  Do you have a fever, pain , or abdominal swelling? No. Pain Score  0 *  Have you tolerated food without any problems? Yes.    Have you been able to return to your normal activities? Yes.    Do you have any questions about your discharge instructions: Diet   No. Medications  No. Follow up visit  No.  Do you have questions or concerns about your Care? No.  Actions: * If pain score is 4 or above: No action needed, pain <4.

## 2017-06-18 MED FILL — PRAVASTATIN NA 20 MG TAB: 20 | 30 days supply | Qty: 30 | Fill #0

## 2017-06-18 MED FILL — LISINOPRIL 40 MG TAB: 40 | 30 days supply | Qty: 30 | Fill #1

## 2017-06-22 ENCOUNTER — Other Ambulatory Visit: Payer: Self-pay

## 2017-06-22 DIAGNOSIS — K639 Disease of intestine, unspecified: Secondary | ICD-10-CM

## 2017-06-29 MED FILL — AMLODIPINE BESYLATE 10 MG T: 10 | 30 days supply | Qty: 30 | Fill #6

## 2017-06-29 MED FILL — HYDROCHLOROTHIAZIDE 25 MG T: 25 | 30 days supply | Qty: 30 | Fill #1

## 2017-06-30 ENCOUNTER — Other Ambulatory Visit: Payer: Self-pay | Admitting: Pharmacist

## 2017-06-30 ENCOUNTER — Ambulatory Visit (INDEPENDENT_AMBULATORY_CARE_PROVIDER_SITE_OTHER)
Admission: RE | Admit: 2017-06-30 | Discharge: 2017-06-30 | Disposition: A | Discharge status: Home / Self Care | Payer: Self-pay | Source: Ambulatory Visit | Attending: Internal Medicine | Admitting: Internal Medicine

## 2017-06-30 DIAGNOSIS — K639 Disease of intestine, unspecified: Secondary | ICD-10-CM

## 2017-06-30 DIAGNOSIS — I10 Essential (primary) hypertension: Secondary | ICD-10-CM

## 2017-06-30 MED ORDER — CARVEDILOL 12.5 MG PO TABS: 12.5000 mg | ORAL_TABLET | Freq: Two times a day (BID) | ORAL | 2 refills | Status: DC

## 2017-06-30 MED ORDER — IOPAMIDOL (ISOVUE-300) INJECTION 61%
100.0000 mL | Freq: Once | INTRAVENOUS | Status: AC | PRN
  Administered 2017-06-30: 100 mL via INTRAVENOUS

## 2017-06-30 MED FILL — ?CARVEDILOL 12.5 MG TABLET: 12.5 | 30 days supply | Qty: 60 | Fill #0

## 2017-07-01 ENCOUNTER — Inpatient Hospital Stay: Admission: RE | Admit: 2017-07-01 | Payer: Self-pay | Source: Ambulatory Visit

## 2017-07-16 ENCOUNTER — Ambulatory Visit: Payer: Self-pay | Admitting: Family Medicine

## 2017-07-21 ENCOUNTER — Encounter: Payer: Self-pay | Admitting: Internal Medicine

## 2017-07-21 ENCOUNTER — Ambulatory Visit: Payer: Self-pay | Attending: Family Medicine | Admitting: Internal Medicine

## 2017-07-21 VITALS — BP 120/71 | HR 63 | Temp 97.9°F | Resp 18 | Ht 62.0 in | Wt 301.0 lb

## 2017-07-21 DIAGNOSIS — L570 Actinic keratosis: Secondary | ICD-10-CM | POA: Insufficient documentation

## 2017-07-21 DIAGNOSIS — Z791 Long term (current) use of non-steroidal anti-inflammatories (NSAID): Secondary | ICD-10-CM | POA: Insufficient documentation

## 2017-07-21 DIAGNOSIS — M7989 Other specified soft tissue disorders: Secondary | ICD-10-CM | POA: Insufficient documentation

## 2017-07-21 DIAGNOSIS — E785 Hyperlipidemia, unspecified: Secondary | ICD-10-CM | POA: Insufficient documentation

## 2017-07-21 DIAGNOSIS — Z6841 Body Mass Index (BMI) 40.0 and over, adult: Secondary | ICD-10-CM

## 2017-07-21 DIAGNOSIS — X32XXXA Exposure to sunlight, initial encounter: Secondary | ICD-10-CM

## 2017-07-21 DIAGNOSIS — R600 Localized edema: Secondary | ICD-10-CM | POA: Insufficient documentation

## 2017-07-21 DIAGNOSIS — Z7982 Long term (current) use of aspirin: Secondary | ICD-10-CM | POA: Insufficient documentation

## 2017-07-21 DIAGNOSIS — I10 Essential (primary) hypertension: Secondary | ICD-10-CM | POA: Insufficient documentation

## 2017-07-21 MED ORDER — HYDROCHLOROTHIAZIDE 25 MG PO TABS: 25.0000 mg | ORAL_TABLET | Freq: Every day | ORAL | 1 refills | Status: DC

## 2017-07-21 MED ORDER — AMLODIPINE BESYLATE 10 MG PO TABS: 10.0000 mg | ORAL_TABLET | Freq: Every day | ORAL | 3 refills | Status: DC

## 2017-07-21 MED ORDER — PRAVASTATIN SODIUM 20 MG PO TABS: 20.0000 mg | ORAL_TABLET | Freq: Every day | ORAL | 2 refills | Status: DC

## 2017-07-21 MED ORDER — CARVEDILOL 12.5 MG PO TABS: 12.5000 mg | ORAL_TABLET | Freq: Two times a day (BID) | ORAL | 2 refills | Status: DC

## 2017-07-21 MED ORDER — LISINOPRIL 40 MG PO TABS: 40.0000 mg | ORAL_TABLET | Freq: Every day | ORAL | 1 refills | Status: DC

## 2017-07-21 MED FILL — LISINOPRIL 40 MG TAB: 40 | 30 days supply | Qty: 30 | Fill #2

## 2017-07-21 MED FILL — ?PRAVASTATIN SODIUM 20MG TA: 20 | 30 days supply | Qty: 30 | Fill #1

## 2017-07-21 NOTE — Patient Instructions (Addendum)
Follow a Healthy Eating Plan - You can do it! Limit sugary drinks.  Avoid sodas, sweet tea, sport or energy drinks, or fruit drinks.  Drink water, lo-fat milk, or diet drinks. Limit snack foods.   Cut back on candy, cake, cookies, chips, ice cream.  These are a special treat, only in small amounts. Eat plenty of vegetables.  Especially dark green, red, and orange vegetables. Aim for at least 3 servings a day. More is better! Include fruit in your daily diet.  Whole fruit is much healthier than fruit juice! Limit "white" bread, "white" pasta, "white" rice.   Choose "100% whole grain" products, brown or wild rice. Avoid fatty meats. Try "Meatless Monday" and choose eggs or beans one day a week.  When eating meat, choose lean meats like chicken, [place], and fish.  Grill, broil, or bake meats instead of frying, and eat poultry without the skin. Eat less salt.  Avoid frozen pizzas, frozen dinners and salty foods.  Use seasonings other than salt in cooking.  This can help blood pressure and keep you from swelling Beer, wine and liquor have calories.  If you can safely drink alcohol, limit to 1 drink per day for  women, 2 drinks for men  I have referred you to a dermatologist.  Please remember to use sun screen when you plan to be out in the sun for extended periods.  Try wearing compression socks when you plan to do a lot of walking/standing.

## 2017-07-21 NOTE — Progress Notes (Signed)
Patient ID: Scott Mcmillan, male    DOB: 09/13/1960  MRN: [id number]  CC: Follow-up   Subjective: Scott Mcmillan is a 57 y.o. male who presents for chronic ds management.  Past PCP who is NP Hairston who is no longer with the practice. His concerns today include:  Patient with history of HTN, HL  HYPERTENSION Currently taking: see medication list Med Adherence: [x]  Yes    []  No Medication side effects: []  Yes    [x]  No Adherence with salt restriction: [x]  Yes    []  No Home Monitoring?: [x]  Yes    []  No Monitoring Frequency: [x]  Yes    []  No about 2-3 x a wk Home BP results range: [x]  Yes    []  No SBP 120s.  DBP 80-90s SOB? []  Yes    [x]  No Chest Pain?: []  Yes    [x]  No Leg swelling?: [x]  Yes    Swelling in LT ankle x 2 yrs.  No pain Headaches?: []  Yes    [x]  No Dizziness? []  Yes    [x]  No Comments:   HL:  Tolerating stain without muscle cramps/aches.  Obesity:  Walking a few times a wk for about an hr Also works Office manager on the weekend.  Walks [place] several times during his shift. -gained 38 lbs since 6 /2018.  "I eat too much. I know it.I'm a junk food junky."  He did not realize that he had gained as much wgh as he did.  Patient Active Problem List   Diagnosis Date Noted  . Hyperlipidemia with target low density lipoprotein (LDL) cholesterol less than 100 mg/dL 40/98/1191  . Hypertension 10/31/2016     Current Outpatient Medications on File Prior to Visit  Medication Sig Dispense Refill  . aspirin EC 81 MG tablet Take 1 tablet (81 mg total) by mouth daily. 90 tablet 3  . ibuprofen (ADVIL,MOTRIN) 800 MG tablet Take 1 tablet (800 mg total) by mouth every 8 (eight) hours as needed. Take with food. 30 tablet 0   Current Facility-Administered Medications on File Prior to Visit  Medication Dose Route Frequency Provider Last Rate Last Dose  . 0.9 %  sodium chloride infusion  500 mL Intravenous Once Pyrtle, Carie Caddy, MD        No Known Allergies  Social History    Socioeconomic History  . Marital status: Single    Spouse name: Not on file  . Number of children: Not on file  . Years of education: Not on file  . Highest education level: Not on file  Social Needs  . Financial resource strain: Not on file  . Food insecurity - worry: Not on file  . Food insecurity - inability: Not on file  . Transportation needs - medical: Not on file  . Transportation needs - non-medical: Not on file  Occupational History  . Not on file  Tobacco Use  . Smoking status: Never Smoker  . Smokeless tobacco: Never Used  Substance and Sexual Activity  . Alcohol use: No  . Drug use: No  . Sexual activity: Not on file  Other Topics Concern  . Not on file  Social History Narrative  . Not on file    Family History  Problem Relation Age of Onset  . Colon cancer Neg Hx   . Esophageal cancer Neg Hx   . Liver cancer Neg Hx   . Pancreatic cancer Neg Hx   . Rectal cancer Neg Hx   .  Stomach cancer Neg Hx     History reviewed. No pertinent surgical history.  ROS: Review of Systems Skin: Noted to have dry flaky skin on sun exposed portion of both arms.  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  Uses Eucerin lotion but not every day  PHYSICAL EXAM: BP 120/71 (BP Location: Left Arm, Patient Position: Sitting, Cuff Size: Large)   Pulse 63   Temp 97.9 F (36.6 C) (Oral)   Resp 18   Ht 5\' 2"  (1.575 m)   Wt (!) 301 lb (136.5 kg)   SpO2 95%   BMI 55.05 kg/m   Wt Readings from Last 3 Encounters:  07/21/17 (!) 301 lb (136.5 kg)  06/16/17 286 lb (129.7 kg)  06/02/17 292 lb 3.2 oz (132.5 kg)    Physical Exam   General appearance - alert, well appearing, and in no distress Mental status - alert, oriented to person, place, and time, normal mood, behavior, speech, dress, motor activity, and thought processes Neck - supple, no significant adenopathy Chest - clear to auscultation, no wheezes, rales or  rhonchi, symmetric air entry Heart - normal rate, regular rhythm, normal S1, S2, no murmurs, rubs, clicks or gallops Extremities -mild nonpitting edema of the left lower leg and ankle Skin -flaky dry intermittent hyperpigmented areas on upper arms but mainly forearms  Lab Results  Component Value Date   WBC 12.1 (H) 09/23/2016   HGB 13.6 09/23/2016   HCT 42.4 09/23/2016   MCV 91.0 09/23/2016   PLT 225 09/23/2016     Chemistry      Component Value Date/Time   NA 138 10/31/2016 0905   K 3.7 10/31/2016 0905   CL 96 10/31/2016 0905   CO2 25 10/31/2016 0905   BUN 14 10/31/2016 0905   CREATININE 1.00 10/31/2016 0905      Component Value Date/Time   CALCIUM 9.9 10/31/2016 0905   ALKPHOS 60 10/31/2016 0905   AST 21 10/31/2016 0905   ALT 20 10/31/2016 0905   BILITOT 0.6 10/31/2016 0905      ASSESSMENT AND PLAN: 1. Essential hypertension At goal.  Continue current medications and DASH diet - amLODipine (NORVASC) 10 MG tablet; Take 1 tablet (10 mg total) by mouth daily.  Dispense: 90 tablet; Refill: 3 - carvedilol (COREG) 12.5 MG tablet; Take 1 tablet (12.5 mg total) by mouth 2 (two) times daily with a meal.  Dispense: 60 tablet; Refill: 2 - hydrochlorothiazide (HYDRODIURIL) 25 MG tablet; Take 1 tablet (25 mg total) by mouth daily.  Dispense: 30 tablet; Refill: 1 - lisinopril (PRINIVIL,ZESTRIL) 40 MG tablet; Take 1 tablet (40 mg total) by mouth daily.  Dispense: 30 tablet; Refill: 1  2. Hyperlipidemia, unspecified hyperlipidemia type - pravastatin (PRAVACHOL) 20 MG tablet; Take 1 tablet (20 mg total) by mouth daily.  Dispense: 30 tablet; Refill: 2  3. Actinic keratosis due to exposure to sunlight Encourage the use of sunscreen with SPF of 30 when he is expected to be in direct sunlight for more than 20 minutes. - Ambulatory referral to Dermatology  4. Lower extremity edema Encouraged use of compression socks  5.  Morbid obesity -Spent some time going over the importance of  healthy eating habits and regular exercise.  It is important that he get his weight back down to prevent complications by diabetes, osteoarthritis and cardiovascular complications. -Dietary counseling given along with printed information Patient was given the opportunity to ask questions.  Patient  verbalized understanding of the plan and was able to repeat key elements of the plan.   Orders Placed This Encounter  Procedures  . Ambulatory referral to Dermatology     Requested Prescriptions   Signed Prescriptions Disp Refills  . amLODipine (NORVASC) 10 MG tablet 90 tablet 3    Sig: Take 1 tablet (10 mg total) by mouth daily.  . carvedilol (COREG) 12.5 MG tablet 60 tablet 2    Sig: Take 1 tablet (12.5 mg total) by mouth 2 (two) times daily with a meal.  . pravastatin (PRAVACHOL) 20 MG tablet 30 tablet 2    Sig: Take 1 tablet (20 mg total) by mouth daily.  . hydrochlorothiazide (HYDRODIURIL) 25 MG tablet 30 tablet 1    Sig: Take 1 tablet (25 mg total) by mouth daily.  Marland Kitchen lisinopril (PRINIVIL,ZESTRIL) 40 MG tablet 30 tablet 1    Sig: Take 1 tablet (40 mg total) by mouth daily.    Return in about 3 months (around 10/21/2017).  Jonah Blue, MD, FACP

## 2017-08-06 MED FILL — ?CARVEDILOL 12.5 MG TABLET: 12.5 | 30 days supply | Qty: 60 | Fill #1

## 2017-08-06 MED FILL — HYDROCHLOROTHIAZIDE 25 MG T: 25 | 30 days supply | Qty: 30 | Fill #2

## 2017-08-06 MED FILL — AMLODIPINE BESYLATE 10 MG T: 10 | 30 days supply | Qty: 30 | Fill #7

## 2017-09-02 MED FILL — AMLODIPINE BESYLATE 10 MG T: 10 | 30 days supply | Qty: 30 | Fill #8

## 2017-09-02 MED FILL — ?CARVEDILOL 12.5 MG TABLET: 12.5 | 30 days supply | Qty: 60 | Fill #2

## 2017-09-02 MED FILL — ?PRAVASTATIN SODIUM 20MG TA: 20 | 30 days supply | Qty: 30 | Fill #0

## 2017-09-02 MED FILL — HYDROCHLOROTHIAZIDE 25 MG T: 25 | 30 days supply | Qty: 30 | Fill #0

## 2017-09-11 ENCOUNTER — Ambulatory Visit: Payer: Self-pay | Attending: Internal Medicine

## 2017-09-11 NOTE — Progress Notes (Unsigned)
Skin cancer screening

## 2017-10-06 MED FILL — HYDROCHLOROTHIAZIDE 25 MG T: 25 | 30 days supply | Qty: 30 | Fill #1

## 2017-10-06 MED FILL — LISINOPRIL 40 MG TABLET: 40 | 30 days supply | Qty: 30 | Fill #0

## 2017-10-06 MED FILL — PRAVASTATIN SODIUM 20 MG TA: 20 | 30 days supply | Qty: 30 | Fill #1

## 2017-10-06 MED FILL — CARVEDILOL 12.5 MG TABLET: 12.5 | 30 days supply | Qty: 60 | Fill #0

## 2017-10-06 MED FILL — AMLODIPINE BESYLATE 10 MG T: 10 | 30 days supply | Qty: 30 | Fill #9

## 2017-10-16 ENCOUNTER — Ambulatory Visit: Payer: Self-pay | Attending: Internal Medicine | Admitting: *Deleted

## 2017-10-16 VITALS — BP 145/95 | HR 79 | Wt 303.0 lb

## 2017-10-16 DIAGNOSIS — L570 Actinic keratosis: Secondary | ICD-10-CM

## 2017-10-16 DIAGNOSIS — X32XXXA Exposure to sunlight, initial encounter: Principal | ICD-10-CM

## 2017-10-22 ENCOUNTER — Encounter: Payer: Self-pay | Admitting: Internal Medicine

## 2017-10-22 ENCOUNTER — Ambulatory Visit: Payer: Self-pay | Attending: Internal Medicine | Admitting: Internal Medicine

## 2017-10-22 VITALS — BP 126/83 | HR 80 | Temp 98.7°F | Resp 16 | Wt 303.0 lb

## 2017-10-22 DIAGNOSIS — Z114 Encounter for screening for human immunodeficiency virus [HIV]: Secondary | ICD-10-CM

## 2017-10-22 DIAGNOSIS — R600 Localized edema: Secondary | ICD-10-CM

## 2017-10-22 DIAGNOSIS — I10 Essential (primary) hypertension: Secondary | ICD-10-CM | POA: Insufficient documentation

## 2017-10-22 DIAGNOSIS — K648 Other hemorrhoids: Secondary | ICD-10-CM | POA: Insufficient documentation

## 2017-10-22 DIAGNOSIS — Z6841 Body Mass Index (BMI) 40.0 and over, adult: Secondary | ICD-10-CM | POA: Insufficient documentation

## 2017-10-22 DIAGNOSIS — Z79899 Other long term (current) drug therapy: Secondary | ICD-10-CM | POA: Insufficient documentation

## 2017-10-22 DIAGNOSIS — Z23 Encounter for immunization: Secondary | ICD-10-CM

## 2017-10-22 DIAGNOSIS — Z7982 Long term (current) use of aspirin: Secondary | ICD-10-CM | POA: Insufficient documentation

## 2017-10-22 DIAGNOSIS — E785 Hyperlipidemia, unspecified: Secondary | ICD-10-CM | POA: Insufficient documentation

## 2017-10-22 DIAGNOSIS — Z1159 Encounter for screening for other viral diseases: Secondary | ICD-10-CM

## 2017-10-22 DIAGNOSIS — M7989 Other specified soft tissue disorders: Secondary | ICD-10-CM | POA: Insufficient documentation

## 2017-10-22 MED ORDER — HYDROCORTISONE ACETATE 25 MG RE SUPP
25.0000 mg | Freq: Two times a day (BID) | RECTAL | 1 refills | Status: DC | PRN
Start: 2017-10-22 — End: 2018-01-22

## 2017-10-22 MED ORDER — DOCUSATE SODIUM 100 MG PO CAPS: 100.0000 mg | ORAL_CAPSULE | Freq: Every day | ORAL | 1 refills | Status: DC | PRN

## 2017-10-22 MED ORDER — TETANUS-DIPHTH-ACELL PERTUSSIS 5-2.5-18.5 LF-MCG/0.5 IM SUSP: 0.5000 mL | Freq: Once | INTRAMUSCULAR | 0 refills | Status: AC

## 2017-10-22 NOTE — Progress Notes (Signed)
Patient ID: Scott EvertsMark Jurney, male    DOB: 06-16-1960  MRN: [id number]  CC: Hypertension   Subjective: Scott Mcmillan is a 57 y.o. male who presents for chronic ds management. His concerns today include:  Patient with history of HTN, HL, obesity  HYPERTENSION Currently taking: see medication list Med Adherence: [x]  Yes    []  No Medication side effects: []  Yes    [x]  No Adherence with salt restriction: [x]  Yes, but feels he gets more when he eats out   Home Monitoring?: []  Yes    [x]  No, not recently SOB? []  Yes    [x]  No Chest Pain?: []  Yes    [x]  No Leg swelling?: [x]  Yes, in LT leg which is chronic.  He did purchase some compression socks Headaches?: []  Yes    [x]  No Dizziness? []  Yes    [x]  No Comments:   Actinic Keratosis:  Saw derm since last visit and had some lesions treated  HL:  Compliant and tolerating Pravachol  Hemorrhoids: Had colonoscopy done earlier this year.  Found to have internal hemorrhoids and diverticulosis.  Reports some rectal bleeding over the past month especially when bowel movements are hard.  Reports being told by Dr. Rhea BeltonPyrtle that he can do banding of the internal hemorrhoids if needed to get rid of them.  He would like to be referred back to him to be considered for this.  Obesity: He has cut back on eating junk foods but still has room to improve.  Still does a lot of walking at work. Patient Active Problem List   Diagnosis Date Noted  . Actinic keratosis due to exposure to sunlight 07/21/2017  . Lower extremity edema 07/21/2017  . Class 3 severe obesity due to excess calories without serious comorbidity with body mass index (BMI) of 50.0 to 59.9 in adult (HCC) 07/21/2017  . Hyperlipidemia with target low density lipoprotein (LDL) cholesterol less than 100 mg/dL 78/29/562107/16/2018  . Hypertension 10/31/2016     Current Outpatient Medications on File Prior to Visit  Medication Sig Dispense Refill  . amLODipine (NORVASC) 10 MG tablet Take 1 tablet (10 mg  total) by mouth daily. 90 tablet 3  . aspirin EC 81 MG tablet Take 1 tablet (81 mg total) by mouth daily. 90 tablet 3  . carvedilol (COREG) 12.5 MG tablet Take 1 tablet (12.5 mg total) by mouth 2 (two) times daily with a meal. 60 tablet 2  . hydrochlorothiazide (HYDRODIURIL) 25 MG tablet Take 1 tablet (25 mg total) by mouth daily. 30 tablet 1  . ibuprofen (ADVIL,MOTRIN) 800 MG tablet Take 1 tablet (800 mg total) by mouth every 8 (eight) hours as needed. Take with food. 30 tablet 0  . lisinopril (PRINIVIL,ZESTRIL) 40 MG tablet Take 1 tablet (40 mg total) by mouth daily. 30 tablet 1  . pravastatin (PRAVACHOL) 20 MG tablet Take 1 tablet (20 mg total) by mouth daily. 30 tablet 2   Current Facility-Administered Medications on File Prior to Visit  Medication Dose Route Frequency Provider Last Rate Last Dose  . 0.9 %  sodium chloride infusion  500 mL Intravenous Once Pyrtle, Carie CaddyJay M, MD        No Known Allergies  Social History   Socioeconomic History  . Marital status: Single    Spouse name: Not on file  . Number of children: Not on file  . Years of education: Not on file  . Highest education level: Not on file  Occupational History  . Not on  file  Social Needs  . Financial resource strain: Not on file  . Food insecurity:    Worry: Not on file    Inability: Not on file  . Transportation needs:    Medical: Not on file    Non-medical: Not on file  Tobacco Use  . Smoking status: Never Smoker  . Smokeless tobacco: Never Used  Substance and Sexual Activity  . Alcohol use: No  . Drug use: No  . Sexual activity: Not on file  Lifestyle  . Physical activity:    Days per week: Not on file    Minutes per session: Not on file  . Stress: Not on file  Relationships  . Social connections:    Talks on phone: Not on file    Gets together: Not on file    Attends religious service: Not on file    Active member of club or organization: Not on file    Attends meetings of clubs or organizations:  Not on file    Relationship status: Not on file  . Intimate partner violence:    Fear of current or ex partner: Not on file    Emotionally abused: Not on file    Physically abused: Not on file    Forced sexual activity: Not on file  Other Topics Concern  . Not on file  Social History Narrative  . Not on file    Family History  Problem Relation Age of Onset  . Colon cancer Neg Hx   . Esophageal cancer Neg Hx   . Liver cancer Neg Hx   . Pancreatic cancer Neg Hx   . Rectal cancer Neg Hx   . Stomach cancer Neg Hx     No past surgical history on file.  ROS: Review of Systems Negative except as stated above PHYSICAL EXAM: BP 126/83   Pulse 80   Temp 98.7 F (37.1 C) (Oral)   Resp 16   Wt (!) 303 lb (137.4 kg)   SpO2 94%   BMI 46.07 kg/m   Wt Readings from Last 3 Encounters:  10/22/17 (!) 303 lb (137.4 kg)  10/16/17 (!) 303 lb (137.4 kg)  09/11/17 (!) 301 lb 3.2 oz (136.6 kg)    Physical Exam  General appearance - alert, well appearing, and in no distress Mental status - alert, oriented to person, place, and time, normal mood, behavior, speech, dress, motor activity, and thought processes Eyes - pupils equal and reactive, extraocular eye movements intact Mouth - mucous membranes moist, pharynx normal without lesions Neck - supple, no significant adenopathy Chest - clear to auscultation, no wheezes, rales or rhonchi, symmetric air entry Heart - normal rate, regular rhythm, normal S1, S2, no murmurs, rubs, clicks or gallops Extremities - peripheral pulses normal,  no clubbing or cyanosis.  Non pitting edema LEs LT>RT   ASSESSMENT AND PLAN: 1. Essential hypertension At goal.  Continue amlodipine, carvedilol, HCTZ and lisinopril - Comprehensive metabolic panel - CBC - Lipid panel  2. Hyperlipidemia, unspecified hyperlipidemia type Continue Pravachol  3. Internal hemorrhoid Discussed hemorrhoids with him.  Recommend keeping bowel movements regular and soft.   Colace given to use as needed.  Anusol suppositories as needed.  Referral back to Dr. Rhea Belton to be considered for banding procedure - hydrocortisone (ANUSOL-HC) 25 MG suppository; Place 1 suppository (25 mg total) rectally 2 (two) times daily as needed for hemorrhoids or anal itching.  Dispense: 12 suppository; Refill: 1 - docusate sodium (COLACE) 100 MG capsule; Take 1 capsule (  100 mg total) by mouth daily as needed for mild constipation.  Dispense: 30 capsule; Refill: 1 - Ambulatory referral to Gastroenterology  4. Class 3 severe obesity due to excess calories without serious comorbidity with body mass index (BMI) of 50.0 to 59.9 in adult [facility]) Continue to encourage him towards healthy eating habits.  Try taking his dinner and snack with him from home to work  5. Lower extremity edema Continue use of compression socks  6. Need for Tdap vaccination  7. Screening for HIV (human immunodeficiency virus) - HIV antibody  8. Need for hepatitis C screening test - Hepatitis C Antibody   Patient was given the opportunity to ask questions.  Patient verbalized understanding of the plan and was able to repeat key elements of the plan.   Orders Placed This Encounter  Procedures  . Comprehensive metabolic panel  . CBC  . Lipid panel  . HIV antibody  . Hepatitis C Antibody  . Ambulatory referral to Gastroenterology     Requested Prescriptions   Signed Prescriptions Disp Refills  . hydrocortisone (ANUSOL-HC) 25 MG suppository 12 suppository 1    Sig: Place 1 suppository (25 mg total) rectally 2 (two) times daily as needed for hemorrhoids or anal itching.  . docusate sodium (COLACE) 100 MG capsule 30 capsule 1    Sig: Take 1 capsule (100 mg total) by mouth daily as needed for mild constipation.  . Tdap (BOOSTRIX) 5-2.5-18.5 LF-MCG/0.5 injection 0.5 mL 0    Sig: Inject 0.5 mLs into the muscle once for 1 dose.    Return in about 3 months (around 01/22/2018).  Jonah Blue, MD, FACP

## 2017-10-22 NOTE — Patient Instructions (Addendum)
Hemorrhoids Hemorrhoids are swollen veins in and around the rectum or anus. Hemorrhoids can cause pain, itching, or bleeding. Most of the time, they do not cause serious problems. They usually get better with diet changes, lifestyle changes, and other home treatments. Follow these instructions at home: Eating and drinking  Eat foods that have fiber, such as whole grains, beans, nuts, fruits, and vegetables. Ask your doctor about taking products that have added fiber (fibersupplements).  Drink enough fluid to keep your pee (urine) clear or pale yellow. For Pain and Swelling  Take a warm-water bath (sitz bath) for 20 minutes to ease pain. Do this 3-4 times a day.  If directed, put ice on the painful area. It may be helpful to use ice between your warm baths. ? Put ice in a plastic bag. ? Place a towel between your skin and the bag. ? Leave the ice on for 20 minutes, 2-3 times a day. General instructions  Take over-the-counter and prescription medicines only as told by your doctor. ? Medicated creams and medicines that are inserted into the anus (suppositories) may be used or applied as told.  Exercise often.  Go to the bathroom when you have the urge to poop (to have a bowel movement). Do not wait.  Avoid pushing too hard (straining) when you poop.  Keep the butt area dry and clean. Use wet toilet paper or moist paper towels.  Do not sit on the toilet for a long time. Contact a doctor if:  You have any of these: ? Pain and swelling that do not get better with treatment or medicine. ? Bleeding that will not stop. ? Trouble pooping or you cannot poop. ? Pain or swelling outside the area of the hemorrhoids. This information is not intended to replace advice given to you by your health care provider. Make sure you discuss any questions you have with your health care provider. Document Released: 01/29/2008 Document Revised: 09/27/2015 Document Reviewed: 01/03/2015 Elsevier  Interactive Patient Education  2018 Elsevier Inc.   Td Vaccine (Tetanus and Diphtheria): What You Need to Know 1. Why get vaccinated? Tetanus  and diphtheria are very serious diseases. They are rare in the [place] today, but people who do become infected often have severe complications. Td vaccine is used to protect adolescents and adults from both of these diseases. Both tetanus and diphtheria are infections caused by bacteria. Diphtheria spreads from person to person through coughing or sneezing. Tetanus-causing bacteria enter the body through cuts, scratches, or wounds. TETANUS (lockjaw) causes painful muscle tightening and stiffness, usually all over the body.  It can lead to tightening of muscles in the head and neck so you can't open your mouth, swallow, or sometimes even breathe. Tetanus kills about 1 out of every 10 people who are infected even after receiving the best medical care.  DIPHTHERIA can cause a thick coating to form in the back of the throat.  It can lead to breathing problems, paralysis, heart failure, and death.  Before vaccines, as many as 200,000 cases of diphtheria and hundreds of cases of tetanus were reported in the [place] each year. Since vaccination began, reports of cases for both diseases have dropped by about 99%. 2. Td vaccine Td vaccine can protect adolescents and adults from tetanus and diphtheria. Td is usually given as a booster dose every 10 years but it can also be given earlier after a severe and dirty wound or burn. Another vaccine, called Tdap, which protects against pertussis  in addition to tetanus and diphtheria, is sometimes recommended instead of Td vaccine. Your doctor or the person giving you the vaccine can give you more information. Td may safely be given at the same time as other vaccines. 3. Some people should not get this vaccine  A person who has ever had a life-threatening allergic reaction after a previous dose of any  tetanus or diphtheria containing vaccine, OR has a severe allergy to any part of this vaccine, should not get Td vaccine. Tell the person giving the vaccine about any severe allergies.  Talk to your doctor if you: ? had severe pain or swelling after any vaccine containing diphtheria or tetanus, ? ever had a condition called Guillain Barre Syndrome (GBS), ? aren't feeling well on the day the shot is scheduled. 4. What are the risks from Td vaccine? With any medicine, including vaccines, there is a chance of side effects. These are usually mild and go away on their own. Serious reactions are also possible but are rare. Most people who get Td vaccine do not have any problems with it. Mild problems following Td vaccine: (Did not interfere with activities)  Pain where the shot was given (about 8 people in 10)  Redness or swelling where the shot was given (about 1 person in 4)  Mild fever (rare)  Headache (about 1 person in 4)  Tiredness (about 1 person in 4)  Moderate problems following Td vaccine: (Interfered with activities, but did not require medical attention)  Fever over 102F (rare)  Severe problems following Td vaccine: (Unable to perform usual activities; required medical attention)  Swelling, severe pain, bleeding and/or redness in the arm where the shot was given (rare).  Problems that could happen after any vaccine:  People sometimes faint after a medical procedure, including vaccination. Sitting or lying down for about 15 minutes can help prevent fainting, and injuries caused by a fall. Tell your doctor if you feel dizzy, or have vision changes or ringing in the ears.  Some people get severe pain in the shoulder and have difficulty moving the arm where a shot was given. This happens very rarely.  Any medication can cause a severe allergic reaction. Such reactions from a vaccine are very rare, estimated at fewer than 1 in a million doses, and would happen within a few  minutes to a few hours after the vaccination. As with any medicine, there is a very remote chance of a vaccine causing a serious injury or death. The safety of vaccines is always being monitored. For more information, visit: http://floyd.org/ 5. What if there is a serious reaction? What should I look for? Look for anything that concerns you, such as signs of a severe allergic reaction, very high fever, or unusual behavior. Signs of a severe allergic reaction can include hives, swelling of the face and throat, difficulty breathing, a fast heartbeat, dizziness, and weakness. These would usually start a few minutes to a few hours after the vaccination. What should I do?  If you think it is a severe allergic reaction or other emergency that can't wait, call 9-1-1 or get the person to the nearest hospital. Otherwise, call your doctor.  Afterward, the reaction should be reported to the Vaccine Adverse Event Reporting System (VAERS). Your doctor might file this report, or you can do it yourself through the VAERS web site at www.vaers.LAgents.no, or by calling 1-530-683-4061. ? VAERS does not give medical advice. 6. The National Vaccine Injury Compensation Program [employer]  Injury Compensation Program (VICP) is a federal program that was created to compensate people who may have been injured by certain vaccines. Persons who believe they may have been injured by a vaccine can learn about the program and about filing a claim by calling 1-4403220705 or visiting the VICP website at SpiritualWord.at. There is a time limit to file a claim for compensation. 7. How can I learn more?  Ask your doctor. He or she can give you the vaccine package insert or suggest other sources of information.  Call your local or state health department.  Contact the Centers for Disease Control and Prevention (CDC): ? Call 203 517 5263 (1-800-CDC-INFO) ? Visit CDC's website at  PicCapture.uy CDC Td Vaccine VIS (08/14/15) This information is not intended to replace advice given to you by your health care provider. Make sure you discuss any questions you have with your health care provider. Document Released: 02/16/2006 Document Revised: 01/10/2016 Document Reviewed: 01/10/2016 Elsevier Interactive Patient Education  2017 [employer].

## 2017-10-22 NOTE — Addendum Note (Signed)
Addended by: Jonah BlueJOHNSON, DEBORAH B on: 10/22/2017 01:53 PM   Modules accepted: Orders

## 2017-10-23 LAB — LIPID PANEL
CHOL/HDL RATIO: 3.8 ratio (ref 0.0–5.0)
Cholesterol, Total: 136 mg/dL (ref 100–199)
HDL: 36 mg/dL — AB (ref >39)
LDL CALC: 79 mg/dL (ref 0–99)
TRIGLYCERIDES: 106 mg/dL (ref 0–149)
VLDL CHOLESTEROL CAL: 21 mg/dL (ref 5–40)

## 2017-10-23 LAB — COMPREHENSIVE METABOLIC PANEL
ALK PHOS: 46 IU/L (ref 39–117)
ALT: 19 IU/L (ref 0–44)
AST: 20 IU/L (ref 0–40)
Albumin/Globulin Ratio: 1.2 (ref 1.2–2.2)
Albumin: 4.1 g/dL (ref 3.5–5.5)
BUN/Creatinine Ratio: 13 (ref 9–20)
BUN: 12 mg/dL (ref 6–24)
Bilirubin Total: 0.6 mg/dL (ref 0.0–1.2)
CALCIUM: 9.9 mg/dL (ref 8.7–10.2)
CO2: 24 mmol/L (ref 20–29)
CREATININE: 0.89 mg/dL (ref 0.76–1.27)
Chloride: 99 mmol/L (ref 96–106)
GFR calc Af Amer: 108 mL/min/{1.73_m2} (ref >59)
GFR, EST NON AFRICAN AMERICAN: 94 mL/min/{1.73_m2} (ref >59)
GLOBULIN, TOTAL: 3.3 g/dL (ref 1.5–4.5)
GLUCOSE: 118 mg/dL — AB (ref 65–99)
Potassium: 3.8 mmol/L (ref 3.5–5.2)
Sodium: 139 mmol/L (ref 134–144)
Total Protein: 7.4 g/dL (ref 6.0–8.5)

## 2017-10-23 LAB — HEPATITIS C ANTIBODY: Hep C Virus Ab: <0.1 s/co ratio (ref 0.0–0.9)

## 2017-10-23 LAB — HIV ANTIBODY (ROUTINE TESTING W REFLEX): HIV Screen 4th Generation wRfx: NONREACTIVE

## 2017-10-23 NOTE — Addendum Note (Signed)
Addended by: Paschal DoppWHITE, VANESSA J on: 10/23/2017 10:43 AM   Modules accepted: Orders

## 2017-10-24 LAB — CBC
HEMATOCRIT: 44.1 % (ref 37.5–51.0)
Hemoglobin: 14.9 g/dL (ref 13.0–17.7)
MCH: 30.3 pg (ref 26.6–33.0)
MCHC: 33.8 g/dL (ref 31.5–35.7)
MCV: 90 fL (ref 79–97)
Platelets: 204 10*3/uL (ref 150–450)
RBC: 4.91 x10E6/uL (ref 4.14–5.80)
RDW: 14 % (ref 12.3–15.4)
WBC: 7.8 10*3/uL (ref 3.4–10.8)

## 2017-10-28 ENCOUNTER — Telehealth (INDEPENDENT_AMBULATORY_CARE_PROVIDER_SITE_OTHER): Payer: Self-pay

## 2017-10-28 NOTE — Telephone Encounter (Signed)
Contacted pt to go over lab results pt is aware and doesn't have any questions or concerns 

## 2017-10-30 ENCOUNTER — Telehealth: Payer: Self-pay | Admitting: Internal Medicine

## 2017-10-30 NOTE — Telephone Encounter (Signed)
I did not see mention of hemorrhoid banding, please advise. Thank you.

## 2017-10-30 NOTE — Telephone Encounter (Signed)
Patient states at his procedure in 06/2017 Dr.Pyrtle discussed with pt about his internal hemorrhoids and possibly doing a banding. Received referral for patient to have banding. Does pt need ov or sch for direct banding. Please advise.

## 2017-11-02 NOTE — Telephone Encounter (Signed)
Pt scheduled to see Dr. Rhea BeltonPyrtle 11/12/17@9am , pt aware of appt.

## 2017-11-02 NOTE — Telephone Encounter (Signed)
Can be given an office visit (any slot is fine, does not have to be in a banding visit slot) and we can discuss and band if appropriate

## 2017-11-10 ENCOUNTER — Encounter: Payer: Self-pay | Admitting: *Deleted

## 2017-11-11 ENCOUNTER — Other Ambulatory Visit: Payer: Self-pay | Admitting: Internal Medicine

## 2017-11-11 DIAGNOSIS — I10 Essential (primary) hypertension: Secondary | ICD-10-CM

## 2017-11-11 MED FILL — LISINOPRIL 40 MG TABLET: 40 | 30 days supply | Qty: 30 | Fill #1

## 2017-11-11 MED FILL — HYDROCHLOROTHIAZIDE 25 MG T: 25 | 30 days supply | Qty: 30 | Fill #0

## 2017-11-11 MED FILL — ?CARVEDILOL 12.5 MG TABLET: 12.5 | 30 days supply | Qty: 60 | Fill #1

## 2017-11-11 MED FILL — AMLODIPINE BESYLATE 10 MG T: 10 | 30 days supply | Qty: 30 | Fill #10

## 2017-11-11 MED FILL — PRAVASTATIN SODIUM 20 MG TA: 20 | 30 days supply | Qty: 30 | Fill #2

## 2017-11-12 ENCOUNTER — Ambulatory Visit (INDEPENDENT_AMBULATORY_CARE_PROVIDER_SITE_OTHER): Payer: Self-pay | Admitting: Internal Medicine

## 2017-11-12 ENCOUNTER — Encounter: Payer: Self-pay | Admitting: Internal Medicine

## 2017-11-12 VITALS — BP 118/84 | HR 82 | Ht 65.75 in | Wt 303.0 lb

## 2017-11-12 DIAGNOSIS — K648 Other hemorrhoids: Secondary | ICD-10-CM

## 2017-11-12 DIAGNOSIS — K5904 Chronic idiopathic constipation: Secondary | ICD-10-CM

## 2017-11-12 DIAGNOSIS — K602 Anal fissure, unspecified: Secondary | ICD-10-CM

## 2017-11-12 NOTE — Patient Instructions (Addendum)
If you are age 59 or older, your body mass index should be between 23-30. Your Body mass index is 49.28 kg/m. If this is out of the aforementioned range listed, please consider follow up with your Primary Care Provider.  If you are age 59 or younger, your body mass index should be between 19-25. Your Body mass index is 49.28 kg/m. If this is out of the aformentioned range listed, please consider follow up with your Primary Care Provider.   Please purchase the following medications over the counter and take as directed: Recticare-Use as needed to the rectal area for your anal fissure Colace 200 mg every night (docusate)  You have been scheduled for possible hemorrhoidal banding on 01/06/18 @ 345 pm.

## 2017-11-12 NOTE — Progress Notes (Signed)
Patient ID: Scott Mcmillan, male   DOB: 08/08/1960, 57 y.o.   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  He is here alone today.  He is known to me from screening colonoscopy which was performed on 06/16/2017.  This was his first colonoscopy.  A large polypoid lesion was seen in the cecum and was submucosal.  This did have a positive pillow sign though tunneled biopsies were unremarkable.  There was no submucosa present for evaluation.  This was followed by CT scan which showed a fat density mass in the ascending colon associated with the IC valve likely representing lipoma.  Bilateral inguinal hernias containing fat were also seen.  At colonoscopy there were diverticuli in the sigmoid and descending colon and small internal hemorrhoids.  The patient reports that over a month ago he developed perianal pain and bleeding after having a large hard stool.  For the next 3 to 4 weeks he had pain at times intense with passing stool and also persisting for 5 to 30 minutes after bowel movement.  This was a throbbing type pain.  Always associated with bowel movement.  At times pain when he passed flatus.  He reports symptoms are 90% better without specific treatment.  He was seen by primary care and given hydrocortisone suppositories and Colace as recommended but he has not started this.  He does state that he does occasionally deal with hard and large stools/constipation.  Worse if he eats bread and meat and lower vegetable diet.  He does occasionally use Dulcolax for his constipation.  Separate from his acute pain is described above, he does have intermittent more painless bleeding after bowel movement and some prolapse type symptom.  No abdominal pain or upper GI complaint today.  Past  Medical History:  Diagnosis Date  . Bilateral inguinal hernia   . Hemorrhoids   . Hyperlipidemia   . Hypertension     History reviewed. No pertinent surgical history.  Outpatient Medications Prior to Visit  Medication Sig Dispense Refill  . amLODipine (NORVASC) 10 MG tablet Take 1 tablet (10 mg total) by mouth daily. 90 tablet 3  . aspirin EC 81 MG tablet Take 1 tablet (81 mg total) by mouth daily. 90 tablet 3  . carvedilol (COREG) 12.5 MG tablet Take 1 tablet (12.5 mg total) by mouth 2 (two) times daily with a meal. 60 tablet 2  . docusate sodium (COLACE) 100 MG capsule Take 1 capsule (100 mg total) by mouth daily as needed for mild constipation. 30 capsule 1  . hydrochlorothiazide (HYDRODIURIL) 25 MG tablet TAKE 1 TABLET (25 MG TOTAL) BY MOUTH DAILY. 30 tablet 2  . hydrocortisone (ANUSOL-HC) 25 MG suppository Place 1 suppository (25 mg total) rectally 2 (two) times daily as needed for hemorrhoids or anal itching. 12 suppository 1  . ibuprofen (ADVIL,MOTRIN) 800 MG tablet Take 1 tablet (800 mg total) by mouth every 8 (eight) hours as needed. Take with food. 30 tablet 0  . lisinopril (PRINIVIL,ZESTRIL) 40 MG tablet Take 1 tablet (40 mg total) by mouth daily. 30 tablet 1  . pravastatin (PRAVACHOL) 20 MG tablet Take 1 tablet (20 mg total) by mouth daily. 30 tablet 2   Facility-Administered Medications Prior to Visit  Medication Dose Route Frequency Provider Last Rate Last Dose  . 0.9 %  sodium chloride infusion  500 mL Intravenous Once Zaynah Chawla, Carie Caddy, MD        No Known Allergies  Family History  Problem Relation Age of Onset  . Colon cancer Neg Hx   . Esophageal cancer Neg Hx   . Liver cancer Neg Hx   . Pancreatic cancer Neg Hx   . Rectal cancer Neg Hx   . Stomach cancer Neg Hx     Social History   Tobacco Use  . Smoking status: Never Smoker  . Smokeless tobacco: Never Used  Substance Use Topics  . Alcohol use: No  . Drug use: No    ROS: As per history of present  illness, otherwise negative  BP 118/84   Pulse 82   Ht 5' 5.75" (1.67 m)   Wt (!) 303 lb (137.4 kg)   BMI 49.28 kg/m  Constitutional: Well-developed and well-nourished. No distress. HEENT: Normocephalic and atraumatic. No scleral icterus. Neurological: Alert and oriented to person place and time. Skin: Skin is warm and dry.  Psychiatric: Normal mood and affect. Behavior is normal.  RELEVANT LABS AND IMAGING: CBC    Component Value Date/Time   WBC 7.8 10/23/2017 1047   WBC 12.1 (H) 09/23/2016 0953   RBC 4.91 10/23/2017 1047   RBC 4.66 09/23/2016 0953   HGB 14.9 10/23/2017 1047   HCT 44.1 10/23/2017 1047   PLT 204 10/23/2017 1047   MCV 90 10/23/2017 1047   MCH 30.3 10/23/2017 1047   MCH 29.2 09/23/2016 0953   MCHC 33.8 10/23/2017 1047   MCHC 32.1 09/23/2016 0953   RDW 14.0 10/23/2017 1047    CMP     Component Value Date/Time   NA 139 10/22/2017 1130   K 3.8 10/22/2017 1130   CL 99 10/22/2017 1130   CO2 24 10/22/2017 1130   GLUCOSE 118 (H) 10/22/2017 1130   GLUCOSE 150 (H) 09/23/2016 0953   BUN 12 10/22/2017 1130   CREATININE 0.89 10/22/2017 1130   CALCIUM 9.9 10/22/2017 1130   PROT 7.4 10/22/2017 1130   ALBUMIN 4.1 10/22/2017 1130   AST 20 10/22/2017 1130   ALT 19 10/22/2017 1130   ALKPHOS 46 10/22/2017 1130   BILITOT 0.6 10/22/2017 1130   GFRNONAA 94 10/22/2017 1130   GFRAA 108 10/22/2017 1130   CT ABDOMEN AND PELVIS WITH CONTRAST   TECHNIQUE: Multidetector CT imaging of the abdomen and pelvis was performed using the standard protocol following bolus administration of intravenous contrast.   CONTRAST:  ISOVUE-300 IOPAMIDOL (ISOVUE-300) INJECTION 61%   COMPARISON:  None.   FINDINGS: Lower chest: Calcified granulomas. Noncalcified nodules measure 2 mm or less in size and may represent additional granulomas. Heart is mildly enlarged. No pericardial or pleural effusion. Distal esophagus is grossly unremarkable.   Hepatobiliary: Liver and  gallbladder are unremarkable. No biliary ductal dilatation.   Pancreas: Negative.   Spleen: Negative.   Adrenals/Urinary Tract: Adrenal glands and right kidney are unremarkable. Low-attenuation lesions in the left kidney measure up to 2.2 cm and are likely cysts although definitive characterization is limited by small size of many lesions. Ureters are decompressed. Bladder is low in volume.   Stomach/Bowel: Tiny hiatal hernia. Stomach, small bowel and appendix are unremarkable. There is a large fat density lesion in the proximal ascending colon measuring approximately 3.6 x 4.0 x 7.0 cm. It is likely associated with the ileocecal valve (coronal series 6, image 46). Colon is otherwise unremarkable.   Vascular/Lymphatic: Atherosclerotic calcification of the arterial vasculature without abdominal  aortic aneurysm. No pathologically enlarged lymph nodes.   Reproductive: Prostate is visualized.   Other: No free fluid. Bilateral inguinal hernias contain fat. Mesenteries and peritoneum are unremarkable.   Musculoskeletal: No worrisome lytic or sclerotic lesions. Degenerative changes in the spine.   IMPRESSION: 1. Fat-density mass in the ascending colon appears to be associated with the ileocecal valve and may represent a lipoma. Patient underwent recent colonoscopy and presumed biopsy of this lesion. 2.  Aortic atherosclerosis (ICD10-170.0). 3. Bilateral inguinal hernias contain fat.     Electronically Signed   By: Leanna BattlesMelinda  Blietz M.D.   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   1.  Anal fissure --classic anal fissure symptom, now improved after about 4 weeks.  We discussed the pathophysiology and treatment for anal fissure.  For now I recommended RectiCare  over-the-counter per box instruction as needed for perianal pain.  I am not going to start nitroglycerin or diltiazem as symptoms have largely improved.  I have recommended that he begin Colace 200 mg each night to avoid hard stool which would exacerbate this issue.  2.  Constipation --see above, increase fiber in diet and add Colace.  Can discuss other laxatives such as MiraLAX if this is ineffective therapy  3.  Internal hemorrhoids --symptomatic internal hemorrhoids with bleeding and prolapse.  Banding not performed today to allow additional time for anal fissure to fully heal.  I recommended we pursue hemorrhoidal banding and he will be scheduled follow-up for banding.  He is agreeable with the plan  25 minutes spent with the patient today. Greater than 50% was spent in counseling and coordination of care with the patient     QI:[id number]:Johnson, Binnie RailDeborah B, Md [address] [place], [place] [id number]

## 2017-11-25 ENCOUNTER — Ambulatory Visit: Payer: Self-pay | Attending: Internal Medicine

## 2017-12-14 ENCOUNTER — Encounter: Payer: Self-pay | Admitting: *Deleted

## 2017-12-18 ENCOUNTER — Other Ambulatory Visit: Payer: Self-pay | Admitting: Internal Medicine

## 2017-12-18 DIAGNOSIS — I10 Essential (primary) hypertension: Secondary | ICD-10-CM

## 2017-12-18 DIAGNOSIS — E785 Hyperlipidemia, unspecified: Secondary | ICD-10-CM

## 2017-12-18 MED FILL — ?CARVEDILOL 12.5 MG TABLET: 12.5 | 30 days supply | Qty: 60 | Fill #2

## 2017-12-18 MED FILL — HYDROCHLOROTHIAZIDE 25 MG T: 25 | 30 days supply | Qty: 30 | Fill #1

## 2017-12-18 MED FILL — AMLODIPINE BESYLATE 10 MG T: 10 | 90 days supply | Qty: 90 | Fill #0

## 2017-12-21 MED FILL — PRAVASTATIN SODIUM 20 MG TA: 20 | 30 days supply | Qty: 30 | Fill #0

## 2017-12-21 MED FILL — LISINOPRIL 40 MG TABLET: 40 | 30 days supply | Qty: 30 | Fill #0

## 2018-01-06 ENCOUNTER — Ambulatory Visit (INDEPENDENT_AMBULATORY_CARE_PROVIDER_SITE_OTHER): Payer: Self-pay | Admitting: Internal Medicine

## 2018-01-06 ENCOUNTER — Encounter (INDEPENDENT_AMBULATORY_CARE_PROVIDER_SITE_OTHER): Payer: Self-pay

## 2018-01-06 ENCOUNTER — Encounter: Payer: Self-pay | Admitting: Internal Medicine

## 2018-01-06 VITALS — BP 124/80 | HR 92 | Ht 67.0 in | Wt 302.0 lb

## 2018-01-06 DIAGNOSIS — K641 Second degree hemorrhoids: Secondary | ICD-10-CM

## 2018-01-06 DIAGNOSIS — K648 Other hemorrhoids: Secondary | ICD-10-CM

## 2018-01-06 NOTE — Progress Notes (Signed)
Scott Mcmillan is a 57 year old male who returns for consideration of hemorrhoidal banding.  He was seen on 11/12/2017 but at that time was having anal fissure symptoms. Fissure symptoms have almost 100% resolved.  He is been using Colace initially 100 mg but worked his way up to 200 mg at bedtime.  Stools have been less hard and easier to pass. Also tried RectiCare which helped with pain but has not needed this in the last week  Internal hemorrhoid symptoms consist of bleeding and prolapse which are long-standing for him   PROCEDURE NOTE:  The patient presents with symptomatic grade 2 internal hemorrhoids, requesting rubber band ligation of his hemorrhoidal disease.  All risks, benefits and alternative forms of therapy were described and informed consent was obtained.   The anorectum was pre-medicated with 0.125% nitroglycerin ointment The decision was made to band the LL internal hemorrhoid, and the [facility] was used to perform band ligation without complication.   Digital anorectal examination was then performed to assure proper positioning of the band, and to adjust the banded tissue as required.  The patient was discharged home without pain or other issues.  Dietary and behavioral recommendations were given and along with follow-up instructions.     The following adjunctive treatments were recommended: Continue Colace 200 mg nightly Liberal water intake to avoid hard constipated stools RectiCare as needed per box instruction  The patient will return as scheduled for follow-up and possible additional banding as required. No complications were encountered and the patient tolerated the procedure well.

## 2018-01-06 NOTE — Patient Instructions (Signed)
You have been scheduled for your 2nd hemorrhoidal banding on Wednesday, 01/27/18 at 10:15 am.  Please purchase the following medications over the counter and take as directed: Colace 200 mg every night. Recticare- Apply to rectum as needed  If you are age 59 or older, your body mass index should be between 23-30. Your Body mass index is 47.3 kg/m. If this is out of the aforementioned range listed, please consider follow up with your Primary Care Provider.  If you are age 74 or younger, your body mass index should be between 19-25. Your Body mass index is 47.3 kg/m. If this is out of the aformentioned range listed, please consider follow up with your Primary Care Provider.   HEMORRHOID BANDING PROCEDURE    FOLLOW-UP CARE   1. The procedure you have had should have been relatively painless since the banding of the area involved does not have nerve endings and there is no pain sensation.  The rubber band cuts off the blood supply to the hemorrhoid and the band may fall off as soon as 48 hours after the banding (the band may occasionally be seen in the toilet bowl following a bowel movement). You may notice a temporary feeling of fullness in the rectum which should respond adequately to plain Tylenol or Motrin.  2. Following the banding, avoid strenuous exercise that evening and resume full activity the next day.  A sitz bath (soaking in a warm tub) or bidet is soothing, and can be useful for cleansing the area after bowel movements.     3. To avoid constipation, take two tablespoons of natural wheat bran, natural oat bran, flax, Benefiber or any over the counter fiber supplement and increase your water intake to 7-8 glasses daily.    4. Unless you have been prescribed anorectal medication, do not put anything inside your rectum for two weeks: No suppositories, enemas, fingers, etc.  5. Occasionally, you may have more bleeding than usual after the banding procedure.  This is often from the  untreated hemorrhoids rather than the treated one.  Don't be concerned if there is a tablespoon or so of blood.  If there is more blood than this, lie flat with your bottom higher than your head and apply an ice pack to the area. If the bleeding does not stop within a half an hour or if you feel faint, call our office at [phone] or go to the emergency room.  6. Problems are not common; however, if there is a substantial amount of bleeding, severe pain, chills, fever or difficulty passing urine (very rare) or other problems, you should call us at [phone] or report to the nearest emergency room.  7. Do not stay seated continuously for more than 2-3 hours for a day or two after the procedure.  Tighten your buttock muscles 10-15 times every two hours and take 10-15 deep breaths every 1-2 hours.  Do not spend more than a few minutes on the toilet if you cannot empty your bowel; instead re-visit the toilet at a later time.

## 2018-01-22 ENCOUNTER — Ambulatory Visit: Payer: Self-pay | Attending: Internal Medicine | Admitting: Internal Medicine

## 2018-01-22 ENCOUNTER — Encounter: Payer: Self-pay | Admitting: Internal Medicine

## 2018-01-22 VITALS — BP 138/82 | HR 74 | Temp 97.9°F | Resp 16 | Wt 309.6 lb

## 2018-01-22 DIAGNOSIS — K649 Unspecified hemorrhoids: Secondary | ICD-10-CM | POA: Insufficient documentation

## 2018-01-22 DIAGNOSIS — Z79899 Other long term (current) drug therapy: Secondary | ICD-10-CM | POA: Insufficient documentation

## 2018-01-22 DIAGNOSIS — L570 Actinic keratosis: Secondary | ICD-10-CM | POA: Insufficient documentation

## 2018-01-22 DIAGNOSIS — I10 Essential (primary) hypertension: Secondary | ICD-10-CM | POA: Insufficient documentation

## 2018-01-22 DIAGNOSIS — E785 Hyperlipidemia, unspecified: Secondary | ICD-10-CM | POA: Insufficient documentation

## 2018-01-22 DIAGNOSIS — Z6841 Body Mass Index (BMI) 40.0 and over, adult: Secondary | ICD-10-CM | POA: Insufficient documentation

## 2018-01-22 DIAGNOSIS — Z7982 Long term (current) use of aspirin: Secondary | ICD-10-CM | POA: Insufficient documentation

## 2018-01-22 DIAGNOSIS — Z833 Family history of diabetes mellitus: Secondary | ICD-10-CM | POA: Insufficient documentation

## 2018-01-22 DIAGNOSIS — Z7984 Long term (current) use of oral hypoglycemic drugs: Secondary | ICD-10-CM | POA: Insufficient documentation

## 2018-01-22 DIAGNOSIS — R600 Localized edema: Secondary | ICD-10-CM | POA: Insufficient documentation

## 2018-01-22 DIAGNOSIS — Z23 Encounter for immunization: Secondary | ICD-10-CM | POA: Insufficient documentation

## 2018-01-22 DIAGNOSIS — R7303 Prediabetes: Secondary | ICD-10-CM | POA: Insufficient documentation

## 2018-01-22 LAB — POCT GLYCOSYLATED HEMOGLOBIN (HGB A1C): HbA1c, POC (prediabetic range): 6.3 % (ref 5.7–6.4)

## 2018-01-22 MED ORDER — PRAVASTATIN SODIUM 20 MG PO TABS: 20.0000 mg | ORAL_TABLET | Freq: Every day | ORAL | 6 refills | Status: DC

## 2018-01-22 MED ORDER — LISINOPRIL 40 MG PO TABS: 40.0000 mg | ORAL_TABLET | Freq: Every day | ORAL | 6 refills | Status: DC

## 2018-01-22 MED ORDER — HYDROCHLOROTHIAZIDE 25 MG PO TABS: 25.0000 mg | ORAL_TABLET | Freq: Every day | ORAL | 6 refills | Status: DC

## 2018-01-22 MED ORDER — METFORMIN HCL 500 MG PO TABS: 500.0000 mg | ORAL_TABLET | Freq: Every day | ORAL | 6 refills | Status: DC

## 2018-01-22 MED ORDER — CARVEDILOL 12.5 MG PO TABS: 12.5000 mg | ORAL_TABLET | Freq: Two times a day (BID) | ORAL | 6 refills | Status: DC

## 2018-01-22 MED FILL — PRAVASTATIN SODIUM 20 MG TA: 20 | 30 days supply | Qty: 30 | Fill #0

## 2018-01-22 MED FILL — ?CARVEDILOL 12.5 MG TABLET: 12.5 | 30 days supply | Qty: 60 | Fill #0

## 2018-01-22 MED FILL — LISINOPRIL 40 MG TABLET: 40 | 30 days supply | Qty: 30 | Fill #0

## 2018-01-22 MED FILL — HYDROCHLOROTHIAZIDE 25 MG T: 25 | 30 days supply | Qty: 30 | Fill #0

## 2018-01-22 MED FILL — ?METFORMIN HCL 500MG TABS: 500 | 30 days supply | Qty: 30 | Fill #0

## 2018-01-22 NOTE — Progress Notes (Signed)
Patient ID: Scott Mcmillan, male    DOB: 1960/07/02  MRN: [id number]  CC: Hypertension   Subjective: Scott Mcmillan is a 57 y.o. male who presents for chronic disease management. His concerns today include:  Patient with history of HTN, HL, obesity  Hemorrhoids:  Had banding procedure.  Has to go 2 more times for the same. Keeping BM soft and regular.    HTN:  Compliant with meds.  -Has a device to check but has not done so in a while.  BP readings on recent visit with gastroenterologist were within targeted range -limits salt No CP/SOB/HA/LE edema  Obesity:  "I eat too much.  I know that and some stuff I shouldn't eat."  Admits that he eats too much fast foods. Not getting in much exercise.  I went over lab results with him from last visit.  Blood sugar was in the range for prediabetes.  He has family history of diabetes in his brother and grandfather.  Patient Active Problem List   Diagnosis Date Noted  . Hemorrhoids 01/22/2018  . Prediabetes 01/22/2018  . Actinic keratosis due to exposure to sunlight 07/21/2017  . Lower extremity edema 07/21/2017  . Class 3 severe obesity due to excess calories without serious comorbidity with body mass index (BMI) of 50.0 to 59.9 in adult (HCC) 07/21/2017  . Hyperlipidemia with target low density lipoprotein (LDL) cholesterol less than 100 mg/dL 40/98/1191  . Hypertension 10/31/2016     Current Outpatient Medications on File Prior to Visit  Medication Sig Dispense Refill  . amLODipine (NORVASC) 10 MG tablet Take 1 tablet (10 mg total) by mouth daily. 90 tablet 3  . aspirin EC 81 MG tablet Take 1 tablet (81 mg total) by mouth daily. 90 tablet 3  . docusate sodium (COLACE) 100 MG capsule Take 1 capsule (100 mg total) by mouth daily as needed for mild constipation. 30 capsule 1   No current facility-administered medications on file prior to visit.     No Known Allergies  Social History   Socioeconomic History  . Marital status: Single      Spouse name: Not on file  . Number of children: Not on file  . Years of education: Not on file  . Highest education level: Not on file  Occupational History  . Not on file  Social Needs  . Financial resource strain: Not on file  . Food insecurity:    Worry: Not on file    Inability: Not on file  . Transportation needs:    Medical: Not on file    Non-medical: Not on file  Tobacco Use  . Smoking status: Never Smoker  . Smokeless tobacco: Never Used  Substance and Sexual Activity  . Alcohol use: No  . Drug use: No  . Sexual activity: Not on file  Lifestyle  . Physical activity:    Days per week: Not on file    Minutes per session: Not on file  . Stress: Not on file  Relationships  . Social connections:    Talks on phone: Not on file    Gets together: Not on file    Attends religious service: Not on file    Active member of club or organization: Not on file    Attends meetings of clubs or organizations: Not on file    Relationship status: Not on file  . Intimate partner violence:    Fear of current or ex partner: Not on file    Emotionally abused:  Not on file    Physically abused: Not on file    Forced sexual activity: Not on file  Other Topics Concern  . Not on file  Social History Narrative  . Not on file    Family History  Problem Relation Age of Onset  . Colon cancer Neg Hx   . Esophageal cancer Neg Hx   . Liver cancer Neg Hx   . Pancreatic cancer Neg Hx   . Rectal cancer Neg Hx   . Stomach cancer Neg Hx     No past surgical history on file.  ROS: Review of Systems Negative except as above PHYSICAL EXAM: BP 138/82   Pulse 74   Temp 97.9 F (36.6 C) (Oral)   Resp 16   Wt (!) 309 lb 9.6 oz (140.4 kg)   SpO2 97%   BMI 48.49 kg/m   Wt Readings from Last 3 Encounters:  01/22/18 (!) 309 lb 9.6 oz (140.4 kg)  01/06/18 (!) 302 lb (137 kg)  11/12/17 (!) 303 lb (137.4 kg)    Physical Exam General appearance - alert, well appearing, morbidly obese  Caucasian male and in no distress Mental status - normal mood, behavior, speech, dress, motor activity, and thought processes Neck - supple, no significant adenopathy Chest - clear to auscultation, no wheezes, rales or rhonchi, symmetric air entry Heart - normal rate, regular rhythm, normal S1, S2, no murmurs, rubs, clicks or gallops Extremities -trace nonpitting edema  Results for orders placed or performed in visit on 01/22/18  POCT glycosylated hemoglobin (Hb A1C)  Result Value Ref Range   Hemoglobin A1C     HbA1c POC (<> result, manual entry)     HbA1c, POC (prediabetic range) 6.3 5.7 - 6.4 %   HbA1c, POC (controlled diabetic range)     Results for orders placed or performed in visit on 01/22/18  POCT glycosylated hemoglobin (Hb A1C)  Result Value Ref Range   Hemoglobin A1C     HbA1c POC (<> result, manual entry)     HbA1c, POC (prediabetic range) 6.3 5.7 - 6.4 %   HbA1c, POC (controlled diabetic range)      ASSESSMENT AND PLAN: 1. Essential hypertension Not at goal today but review of flowsheet from recent visit with gastroenterology reveals readings within goal.  We will hold off on making any medication changes.  Continue current medications.  DASH diet encouraged. - lisinopril (PRINIVIL,ZESTRIL) 40 MG tablet; Take 1 tablet (40 mg total) by mouth daily.  Dispense: 30 tablet; Refill: 6 - carvedilol (COREG) 12.5 MG tablet; Take 1 tablet (12.5 mg total) by mouth 2 (two) times daily with a meal.  Dispense: 60 tablet; Refill: 6 - hydrochlorothiazide (HYDRODIURIL) 25 MG tablet; Take 1 tablet (25 mg total) by mouth daily.  Dispense: 30 tablet; Refill: 6  2. Hyperlipidemia, unspecified hyperlipidemia type - pravastatin (PRAVACHOL) 20 MG tablet; Take 1 tablet (20 mg total) by mouth daily.  Dispense: 30 tablet; Refill: 6  3. Need for immunization against influenza - Flu Vaccine QUAD 36+ mos IM  4. Morbid obesity (HCC) 5. Prediabetes Discussed the importance of healthy eating  habits and regular exercise.  Patient agreeable to seeing a nutritionist.  He is agreeable to starting low-dose metformin. - Amb ref to Medical Nutrition Therapy-MNT - Hemoglobin A1c - metFORMIN (GLUCOPHAGE) 500 MG tablet; Take 1 tablet (500 mg total) by mouth daily with breakfast.  Dispense: 30 tablet; Refill: 6 - Amb ref to Medical Nutrition Therapy-MNT  6. Hemorrhoids, unspecified hemorrhoid type  Followed by gastroenterology.  Had recent banding of hemorrhoids.  Discussed importance of keeping bowel movements soft and regular.  Patient was given the opportunity to ask questions.  Patient verbalized understanding of the plan and was able to repeat key elements of the plan.   Orders Placed This Encounter  Procedures  . Flu Vaccine QUAD 36+ mos IM  . Amb ref to Medical Nutrition Therapy-MNT  . POCT glycosylated hemoglobin (Hb A1C)     Requested Prescriptions   Signed Prescriptions Disp Refills  . lisinopril (PRINIVIL,ZESTRIL) 40 MG tablet 30 tablet 6    Sig: Take 1 tablet (40 mg total) by mouth daily.  . carvedilol (COREG) 12.5 MG tablet 60 tablet 6    Sig: Take 1 tablet (12.5 mg total) by mouth 2 (two) times daily with a meal.  . pravastatin (PRAVACHOL) 20 MG tablet 30 tablet 6    Sig: Take 1 tablet (20 mg total) by mouth daily.  . metFORMIN (GLUCOPHAGE) 500 MG tablet 30 tablet 6    Sig: Take 1 tablet (500 mg total) by mouth daily with breakfast.  . hydrochlorothiazide (HYDRODIURIL) 25 MG tablet 30 tablet 6    Sig: Take 1 tablet (25 mg total) by mouth daily.    Return in about 3 months (around 04/23/2018).  Jonah Blue, MD, FACP

## 2018-01-22 NOTE — Patient Instructions (Addendum)
Prediabetes Eating Plan Prediabetes-also called impaired glucose tolerance or impaired fasting glucose-is a condition that causes blood sugar (blood glucose) levels to be higher than normal. Following a healthy diet can help to keep prediabetes under control. It can also help to lower the risk of type 2 diabetes and heart disease, which are increased in people who have prediabetes. Along with regular exercise, a healthy diet:  Promotes weight loss.  Helps to control blood sugar levels.  Helps to improve the way that the body uses insulin.  What do I need to know about this eating plan?  Use the glycemic index (GI) to plan your meals. The index tells you how quickly a food will raise your blood sugar. Choose low-GI foods. These foods take a longer time to raise blood sugar.  Pay close attention to the amount of carbohydrates in the food that you eat. Carbohydrates increase blood sugar levels.  Keep track of how many calories you take in. Eating the right amount of calories will help you to achieve a healthy weight. Losing about 7 percent of your starting weight can help to prevent type 2 diabetes.  You may want to follow a Mediterranean diet. This diet includes a lot of vegetables, lean meats or fish, whole grains, fruits, and healthy oils and fats. What foods can I eat? Grains Whole grains, such as whole-wheat or whole-grain breads, crackers, cereals, and pasta. Unsweetened oatmeal. Bulgur. Barley. Quinoa. Brown rice. Corn or whole-wheat flour tortillas or taco shells. Vegetables Lettuce. Spinach. Peas. Beets. Cauliflower. Cabbage. Broccoli. Carrots. Tomatoes. Squash. Eggplant. Herbs. Peppers. Onions. Cucumbers. Brussels sprouts. Fruits Berries. Bananas. Apples. Oranges. Grapes. Papaya. Mango. Pomegranate. Kiwi. Grapefruit. Cherries. Meats and Other Protein Sources Seafood. Lean meats, such as chicken and [place] or lean cuts of pork and beef. Tofu. Eggs. Nuts. Beans. Dairy Low-fat or  fat-free dairy products, such as yogurt, cottage cheese, and cheese. Beverages Water. Tea. Coffee. Sugar-free or diet soda. Seltzer water. Milk. Milk alternatives, such as soy or almond milk. Condiments Mustard. Relish. Low-fat, low-sugar ketchup. Low-fat, low-sugar barbecue sauce. Low-fat or fat-free mayonnaise. Sweets and Desserts Sugar-free or low-fat pudding. Sugar-free or low-fat ice cream and other frozen treats. Fats and Oils Avocado. Walnuts. Olive oil. The items listed above may not be a complete list of recommended foods or beverages. Contact your dietitian for more options. What foods are not recommended? Grains Refined white flour and flour products, such as bread, pasta, snack foods, and cereals. Beverages Sweetened drinks, such as sweet iced tea and soda. Sweets and Desserts Baked goods, such as cake, cupcakes, pastries, cookies, and cheesecake. The items listed above may not be a complete list of foods and beverages to avoid. Contact your dietitian for more information. This information is not intended to replace advice given to you by your health care provider. Make sure you discuss any questions you have with your health care provider. Document Released: 09/05/2014 Document Revised: 09/27/2015 Document Reviewed: 05/17/2014 Elsevier Interactive Patient Education  2017 Elsevier Inc.   Influenza Virus Vaccine injection (Fluarix) What is this medicine? INFLUENZA VIRUS VACCINE (in floo EN zuh VAHY ruhs vak SEEN) helps to reduce the risk of getting influenza also known as the flu. This medicine may be used for other purposes; ask your health care provider or pharmacist if you have questions. COMMON BRAND NAME(S): Fluarix, Fluzone What should I tell my health care provider before I take this medicine? They need to know if you have any of these conditions: -bleeding disorder like hemophilia -fever  or infection -Guillain-Barre syndrome or other neurological problems -immune  system problems -infection with the human immunodeficiency virus (HIV) or AIDS -low blood platelet counts -multiple sclerosis -an unusual or allergic reaction to influenza virus vaccine, eggs, chicken proteins, latex, gentamicin, other medicines, foods, dyes or preservatives -pregnant or trying to get pregnant -breast-feeding How should I use this medicine? This vaccine is for injection into a muscle. It is given by a health care professional. A copy of Vaccine Information Statements will be given before each vaccination. Read this sheet carefully each time. The sheet may change frequently. Talk to your pediatrician regarding the use of this medicine in children. Special care may be needed. Overdosage: If you think you have taken too much of this medicine contact a poison control center or emergency room at once. NOTE: This medicine is only for you. Do not share this medicine with others. What if I miss a dose? This does not apply. What may interact with this medicine? -chemotherapy or radiation therapy -medicines that lower your immune system like etanercept, anakinra, infliximab, and adalimumab -medicines that treat or prevent blood clots like warfarin -phenytoin -steroid medicines like prednisone or cortisone -theophylline -vaccines This list may not describe all possible interactions. Give your health care provider a list of all the medicines, herbs, non-prescription drugs, or dietary supplements you use. Also tell them if you smoke, drink alcohol, or use illegal drugs. Some items may interact with your medicine. What should I watch for while using this medicine? Report any side effects that do not go away within 3 days to your doctor or health care professional. Call your health care provider if any unusual symptoms occur within 6 weeks of receiving this vaccine. You may still catch the flu, but the illness is not usually as bad. You cannot get the flu from the vaccine. The vaccine  will not protect against colds or other illnesses that may cause fever. The vaccine is needed every year. What side effects may I notice from receiving this medicine? Side effects that you should report to your doctor or health care professional as soon as possible: -allergic reactions like skin rash, itching or hives, swelling of the face, lips, or tongue Side effects that usually do not require medical attention (report to your doctor or health care professional if they continue or are bothersome): -fever -headache -muscle aches and pains -pain, tenderness, redness, or swelling at site where injected -weak or tired This list may not describe all possible side effects. Call your doctor for medical advice about side effects. You may report side effects to FDA at 1-800-FDA-1088. Where should I keep my medicine? This vaccine is only given in a clinic, pharmacy, doctor's office, or other health care setting and will not be stored at home. NOTE: This sheet is a summary. It may not cover all possible information. If you have questions about this medicine, talk to your doctor, pharmacist, or health care provider.  2018 Elsevier/Gold Standard (2007-11-17 09:30:40)

## 2018-01-27 ENCOUNTER — Encounter: Payer: Self-pay | Admitting: Internal Medicine

## 2018-01-27 ENCOUNTER — Ambulatory Visit (INDEPENDENT_AMBULATORY_CARE_PROVIDER_SITE_OTHER): Payer: Self-pay | Admitting: Internal Medicine

## 2018-01-27 VITALS — BP 128/90 | HR 75 | Ht 67.0 in | Wt 303.0 lb

## 2018-01-27 DIAGNOSIS — K648 Other hemorrhoids: Secondary | ICD-10-CM

## 2018-01-27 NOTE — Progress Notes (Signed)
Scott EvertsMark Mcmillan is a 57 year old male with a history of symptom medic internal hemorrhoids and anal fissure who returns for additional hemorrhoidal banding We treated him for anal fissure earlier this month and fissure symptoms have resolved We performed hemorrhoidal banding on 01/06/2018 to the left lateral internal hemorrhoid  Symptoms prior to banding included bleeding and prolapse Tolerated first banding well; symptoms have already improved though he is still seeing scant blood with bowel movement. Stools have been more regular, without constipation   PROCEDURE NOTE:  The patient presents with symptomatic grade 2 internal hemorrhoids, requesting rubber band ligation of his hemorrhoidal disease.  All risks, benefits and alternative forms of therapy were described and informed consent was obtained.   The anorectum was pre-medicated with 0.125% nitroglycerin ointment The decision was made to band the RA (LL at band #1) internal hemorrhoid, and [facility] to perform band ligation without complication.   Digital anorectal examination was then performed to assure proper positioning of the band, and to adjust the banded tissue as required.  The patient was discharged home without pain or other issues.  Dietary and behavioral recommendations were given and along with follow-up instructions.     The following adjunctive treatments were recommended: Colace 200 mg nightly RectiCare if needed for perianal discomfort though he has not needed this recently  The patient will return as scheduled for follow-up and possible additional banding as required. No complications were encountered and the patient tolerated the procedure well.

## 2018-01-27 NOTE — Patient Instructions (Addendum)
You have been scheduled for your 3rd hemorrhoidal banding on 11/519 at 345 pm.   HEMORRHOID BANDING PROCEDURE    FOLLOW-UP CARE   1. The procedure you have had should have been relatively painless since the banding of the area involved does not have nerve endings and there is no pain sensation.  The rubber band cuts off the blood supply to the hemorrhoid and the band may fall off as soon as 48 hours after the banding (the band may occasionally be seen in the toilet bowl following a bowel movement). You may notice a temporary feeling of fullness in the rectum which should respond adequately to plain Tylenol or Motrin.  2. Following the banding, avoid strenuous exercise that evening and resume full activity the next day.  A sitz bath (soaking in a warm tub) or bidet is soothing, and can be useful for cleansing the area after bowel movements.     3. To avoid constipation, take two tablespoons of natural wheat bran, natural oat bran, flax, Benefiber or any over the counter fiber supplement and increase your water intake to 7-8 glasses daily.    4. Unless you have been prescribed anorectal medication, do not put anything inside your rectum for two weeks: No suppositories, enemas, fingers, etc.  5. Occasionally, you may have more bleeding than usual after the banding procedure.  This is often from the untreated hemorrhoids rather than the treated one.  Don't be concerned if there is a tablespoon or so of blood.  If there is more blood than this, lie flat with your bottom higher than your head and apply an ice pack to the area. If the bleeding does not stop within a half an hour or if you feel faint, call our office at [phone] or go to the emergency room.  6. Problems are not common; however, if there is a substantial amount of bleeding, severe pain, chills, fever or difficulty passing urine (very rare) or other problems, you should call us at [phone] or report to the nearest emergency  room.  7. Do not stay seated continuously for more than 2-3 hours for a day or two after the procedure.  Tighten your buttock muscles 10-15 times every two hours and take 10-15 deep breaths every 1-2 hours.  Do not spend more than a few minutes on the toilet if you cannot empty your bowel; instead re-visit the toilet at a later time.

## 2018-03-01 MED FILL — HYDROCHLOROTHIAZIDE 25 MG T: 25 | 30 days supply | Qty: 30 | Fill #1

## 2018-03-01 MED FILL — PRAVASTATIN SODIUM 20 MG TA: 20 | 30 days supply | Qty: 30 | Fill #1

## 2018-03-01 MED FILL — LISINOPRIL 40 MG TABLET: 40 | 30 days supply | Qty: 30 | Fill #1

## 2018-03-01 MED FILL — ?METFORMIN HCL 500MG TABL: 500 | 30 days supply | Qty: 30 | Fill #1

## 2018-03-01 MED FILL — ?CARVEDILOL 12.5 MG TABLET: 12.5 | 30 days supply | Qty: 60 | Fill #1

## 2018-03-09 ENCOUNTER — Ambulatory Visit (INDEPENDENT_AMBULATORY_CARE_PROVIDER_SITE_OTHER): Payer: Self-pay | Admitting: Internal Medicine

## 2018-03-09 ENCOUNTER — Encounter: Payer: Self-pay | Admitting: Internal Medicine

## 2018-03-09 VITALS — BP 126/82 | HR 76 | Ht 67.0 in | Wt 299.0 lb

## 2018-03-09 DIAGNOSIS — K648 Other hemorrhoids: Secondary | ICD-10-CM

## 2018-03-09 DIAGNOSIS — K641 Second degree hemorrhoids: Secondary | ICD-10-CM

## 2018-03-09 NOTE — Patient Instructions (Signed)
Please follow up with Dr Pyrtle as needed.  HEMORRHOID BANDING PROCEDURE    FOLLOW-UP CARE   1. The procedure you have had should have been relatively painless since the banding of the area involved does not have nerve endings and there is no pain sensation.  The rubber band cuts off the blood supply to the hemorrhoid and the band may fall off as soon as 48 hours after the banding (the band may occasionally be seen in the toilet bowl following a bowel movement). You may notice a temporary feeling of fullness in the rectum which should respond adequately to plain Tylenol or Motrin.  2. Following the banding, avoid strenuous exercise that evening and resume full activity the next day.  A sitz bath (soaking in a warm tub) or bidet is soothing, and can be useful for cleansing the area after bowel movements.     3. To avoid constipation, take two tablespoons of natural wheat bran, natural oat bran, flax, Benefiber or any over the counter fiber supplement and increase your water intake to 7-8 glasses daily.    4. Unless you have been prescribed anorectal medication, do not put anything inside your rectum for two weeks: No suppositories, enemas, fingers, etc.  5. Occasionally, you may have more bleeding than usual after the banding procedure.  This is often from the untreated hemorrhoids rather than the treated one.  Don't be concerned if there is a tablespoon or so of blood.  If there is more blood than this, lie flat with your bottom higher than your head and apply an ice pack to the area. If the bleeding does not stop within a half an hour or if you feel faint, call our office at [phone] or go to the emergency room.  6. Problems are not common; however, if there is a substantial amount of bleeding, severe pain, chills, fever or difficulty passing urine (very rare) or other problems, you should call us at [phone] or report to the nearest emergency room.  7. Do not stay seated  continuously for more than 2-3 hours for a day or two after the procedure.  Tighten your buttock muscles 10-15 times every two hours and take 10-15 deep breaths every 1-2 hours.  Do not spend more than a few minutes on the toilet if you cannot empty your bowel; instead re-visit the toilet at a later time.     

## 2018-03-11 NOTE — Progress Notes (Signed)
Scott Mcmillan is a 57 year old male with a history of symptomatic internal hemorrhoids, history of anal fissure who returns for additional hemorrhoidal banding  Last banding was 01/27/2018 which he tolerated well  Overall he feels that hemorrhoidal symptoms have been considerably improved.  Symptoms prior to banding included bleeding and prolapse.   PROCEDURE NOTE:  The patient presents with symptomatic grade 2 internal hemorrhoids, requesting rubber band ligation of his hemorrhoidal disease.  All risks, benefits and alternative forms of therapy were described and informed consent was obtained.   The anorectum was pre-medicated with 0.125% nitroglycerin ointment The decision was made to band the right posterior internal hemorrhoid, and the [facility] O'Regan System was used to perform band ligation without complication.   Digital anorectal examination was then performed to assure proper positioning of the band, and to adjust the banded tissue as required.  The patient was discharged home without pain or other issues.  Dietary and behavioral recommendations were given and along with follow-up instructions.     The following adjunctive treatments were recommended: Continue Colace 200 mg nightly  The patient will return as needed for follow-up and possible additional banding as required. No complications were encountered and the patient tolerated the procedure well.

## 2018-04-08 MED FILL — ?PRAVASTATIN SODIUM 20MG TA: 20 | 30 days supply | Qty: 30 | Fill #1

## 2018-04-08 MED FILL — ?METFORMIN HCL 500MG TABL: 500 | 30 days supply | Qty: 30 | Fill #2

## 2018-04-08 MED FILL — ?CARVEDILOL 12.5 MG TABLET: 12.5 | 30 days supply | Qty: 60 | Fill #0

## 2018-04-08 MED FILL — AMLODIPINE BESYLATE 10 MG T: 10 | 90 days supply | Qty: 90 | Fill #1

## 2018-04-08 MED FILL — HYDROCHLOROTHIAZIDE 25 MG T: 25 | 30 days supply | Qty: 30 | Fill #2

## 2018-04-08 MED FILL — LISINOPRIL 40 MG TABLET: 40 | 30 days supply | Qty: 30 | Fill #1

## 2018-04-23 ENCOUNTER — Ambulatory Visit: Payer: Self-pay | Attending: Internal Medicine | Admitting: Internal Medicine

## 2018-04-23 ENCOUNTER — Encounter: Payer: Self-pay | Admitting: Internal Medicine

## 2018-04-23 VITALS — BP 126/82 | HR 68 | Temp 97.8°F | Ht 67.0 in | Wt 290.0 lb

## 2018-04-23 DIAGNOSIS — Z7982 Long term (current) use of aspirin: Secondary | ICD-10-CM | POA: Insufficient documentation

## 2018-04-23 DIAGNOSIS — Z6841 Body Mass Index (BMI) 40.0 and over, adult: Secondary | ICD-10-CM

## 2018-04-23 DIAGNOSIS — Z7984 Long term (current) use of oral hypoglycemic drugs: Secondary | ICD-10-CM | POA: Insufficient documentation

## 2018-04-23 DIAGNOSIS — E785 Hyperlipidemia, unspecified: Secondary | ICD-10-CM | POA: Insufficient documentation

## 2018-04-23 DIAGNOSIS — R600 Localized edema: Secondary | ICD-10-CM | POA: Insufficient documentation

## 2018-04-23 DIAGNOSIS — R7303 Prediabetes: Secondary | ICD-10-CM | POA: Insufficient documentation

## 2018-04-23 DIAGNOSIS — K4020 Bilateral inguinal hernia, without obstruction or gangrene, not specified as recurrent: Secondary | ICD-10-CM | POA: Insufficient documentation

## 2018-04-23 DIAGNOSIS — Z79899 Other long term (current) drug therapy: Secondary | ICD-10-CM | POA: Insufficient documentation

## 2018-04-23 DIAGNOSIS — I10 Essential (primary) hypertension: Secondary | ICD-10-CM | POA: Insufficient documentation

## 2018-04-23 LAB — POCT GLYCOSYLATED HEMOGLOBIN (HGB A1C): HBA1C, POC (CONTROLLED DIABETIC RANGE): 6 % (ref 0.0–7.0)

## 2018-04-23 LAB — GLUCOSE, POCT (MANUAL RESULT ENTRY): POC Glucose: 129 mg/dl — AB (ref 70–99)

## 2018-04-23 NOTE — Patient Instructions (Signed)
Keep up the good work with healthy eating habits and regular exercise.

## 2018-04-23 NOTE — Progress Notes (Signed)
Patient ID: Scott EvertsMark Mcmillan, male    DOB: March 12, 1961  MRN: [id number]  CC: Diabetes   Subjective: Scott Mcmillan is a 57 y.o. male who presents for chronic ds management His concerns today include:  Patient with history of HTN, HL, obesity, preDM  He is seen Dr. Rhea BeltonPyrtle a few times since last visit with me.  He has had hemorrhoids removed.  Complains of intermittent pain in both groin area more on the left side over the past several months.  Pain in groin when he lifts leg or swings leg out like when getting out of car.  No bulging.  No known initiating factors.  He had a CAT scan done in February of this year that revealed bilateral small inguinal hernias with fat entrapped  HTN: Reports compliance with medications and salt restrictions.  No chest pains or lower extremity edema.  No headaches or dizziness.  PreDM: Started on metformin on last visit.  He reports that he is tolerating the medication well. He is doing better with his eating habits.  Eating less white carbohydrates and portion sizes are less. He exercises every other day by riding his stationary bike. He has lost 13 pounds since last visit. Patient Active Problem List   Diagnosis Date Noted  . Hemorrhoids 01/22/2018  . Prediabetes 01/22/2018  . Actinic keratosis due to exposure to sunlight 07/21/2017  . Lower extremity edema 07/21/2017  . Class 3 severe obesity due to excess calories without serious comorbidity with body mass index (BMI) of 50.0 to 59.9 in adult (HCC) 07/21/2017  . Hyperlipidemia with target low density lipoprotein (LDL) cholesterol less than 100 mg/dL 40/98/119107/16/2018  . Hypertension 10/31/2016     Current Outpatient Medications on File Prior to Visit  Medication Sig Dispense Refill  . amLODipine (NORVASC) 10 MG tablet Take 1 tablet (10 mg total) by mouth daily. 90 tablet 3  . aspirin EC 81 MG tablet Take 1 tablet (81 mg total) by mouth daily. 90 tablet 3  . carvedilol (COREG) 12.5 MG tablet Take 1 tablet  (12.5 mg total) by mouth 2 (two) times daily with a meal. 60 tablet 6  . hydrochlorothiazide (HYDRODIURIL) 25 MG tablet Take 1 tablet (25 mg total) by mouth daily. 30 tablet 6  . lisinopril (PRINIVIL,ZESTRIL) 40 MG tablet Take 1 tablet (40 mg total) by mouth daily. 30 tablet 6  . metFORMIN (GLUCOPHAGE) 500 MG tablet Take 1 tablet (500 mg total) by mouth daily with breakfast. 30 tablet 6  . pravastatin (PRAVACHOL) 20 MG tablet Take 1 tablet (20 mg total) by mouth daily. 30 tablet 6  . docusate sodium (COLACE) 100 MG capsule Take 1 capsule (100 mg total) by mouth daily as needed for mild constipation. (Patient not taking: Reported on 04/23/2018) 30 capsule 1   No current facility-administered medications on file prior to visit.     No Known Allergies  Social History   Socioeconomic History  . Marital status: Single    Spouse name: Not on file  . Number of children: Not on file  . Years of education: Not on file  . Highest education level: Not on file  Occupational History  . Not on file  Social Needs  . Financial resource strain: Not on file  . Food insecurity:    Worry: Not on file    Inability: Not on file  . Transportation needs:    Medical: Not on file    Non-medical: Not on file  Tobacco Use  . Smoking  status: Never Smoker  . Smokeless tobacco: Never Used  Substance and Sexual Activity  . Alcohol use: No  . Drug use: No  . Sexual activity: Not on file  Lifestyle  . Physical activity:    Days per week: Not on file    Minutes per session: Not on file  . Stress: Not on file  Relationships  . Social connections:    Talks on phone: Not on file    Gets together: Not on file    Attends religious service: Not on file    Active member of club or organization: Not on file    Attends meetings of clubs or organizations: Not on file    Relationship status: Not on file  . Intimate partner violence:    Fear of current or ex partner: Not on file    Emotionally abused: Not on  file    Physically abused: Not on file    Forced sexual activity: Not on file  Other Topics Concern  . Not on file  Social History Narrative  . Not on file    Family History  Problem Relation Age of Onset  . Colon cancer Neg Hx   . Esophageal cancer Neg Hx   . Liver cancer Neg Hx   . Pancreatic cancer Neg Hx   . Rectal cancer Neg Hx   . Stomach cancer Neg Hx     No past surgical history on file.  ROS: Review of Systems Negative except as above.  PHYSICAL EXAM: BP 126/82   Pulse 68   Temp 97.8 F (36.6 C) (Oral)   Ht 5\' 7"  (1.702 m)   Wt 290 lb (131.5 kg)   SpO2 96%   BMI 45.42 kg/m   Wt Readings from Last 3 Encounters:  04/23/18 290 lb (131.5 kg)  03/09/18 299 lb (135.6 kg)  01/27/18 (!) 303 lb (137.4 kg)    Physical Exam  General appearance - alert, well appearing, and in no distress Mental status - normal mood, behavior, speech, dress, motor activity, and thought processes Mouth - mucous membranes moist, pharynx normal without lesions Neck - supple, no significant adenopathy Chest - clear to auscultation, no wheezes, rales or rhonchi, symmetric air entry Heart - normal rate, regular rhythm, normal S1, S2, no murmurs, rubs, clicks or gallops GU Male -no bulging noted in the groin area on either side.  No tenderness on palpation along the inguinal ligament. Extremities -no lower extremity edema. Results for orders placed or performed in visit on 04/23/18  POCT glucose (manual entry)  Result Value Ref Range   POC Glucose 129 (A) 70 - 99 mg/dl  POCT glycosylated hemoglobin (Hb A1C)  Result Value Ref Range   Hemoglobin A1C     HbA1c POC (<> result, manual entry)     HbA1c, POC (prediabetic range)     HbA1c, POC (controlled diabetic range) 6.0 0.0 - 7.0 %   ASSESSMENT AND PLAN:  1. Prediabetes A1c has improved.  Continue healthy eating habits and regular exercise.  Continue metformin. - POCT glucose (manual entry) - POCT glycosylated hemoglobin (Hb  A1C) - Microalbumin / creatinine urine ratio  2. Essential hypertension Close to goal.  Continue current medications and salt restriction  3. Morbid obesity (HCC) Commended him on weight loss so far.  Encouraged him to continue riding his bike every other day.  Continue healthy eating habits.  4. Non-recurrent bilateral inguinal hernia without obstruction or gangrene These were very small and seen on CAT scan  early in the year.  Symptoms likely related to this.  I told the patient that we can repeat the CAT scan to see if they have gotten any larger and then refer to a general surgeon.  Patient wants to hold off for now stating he would let me know if it becomes more bothersome.   Patient was given the opportunity to ask questions.  Patient verbalized understanding of the plan and was able to repeat key elements of the plan.   Orders Placed This Encounter  Procedures  . Microalbumin / creatinine urine ratio  . POCT glucose (manual entry)  . POCT glycosylated hemoglobin (Hb A1C)     Requested Prescriptions    No prescriptions requested or ordered in this encounter    No follow-ups on file.  Jonah Blue, MD, FACP

## 2018-04-24 LAB — MICROALBUMIN / CREATININE URINE RATIO
Creatinine, Urine: 152.7 mg/dL
MICROALB/CREAT RATIO: 14.3 mg/g{creat} (ref 0.0–30.0)
Microalbumin, Urine: 21.8 ug/mL

## 2018-04-26 ENCOUNTER — Telehealth: Payer: Self-pay

## 2018-04-26 NOTE — Telephone Encounter (Signed)
Contacted pt to go over lab results left a detailed vm informing pt of results and if he has any questions or concerns to give me a call  

## 2018-05-20 MED FILL — ?METFORMIN HCL 500MG TABLET: 500 | 30 days supply | Qty: 30 | Fill #3

## 2018-05-20 MED FILL — LISINOPRIL 40 MG TABLET: 40 | 30 days supply | Qty: 30 | Fill #2

## 2018-05-20 MED FILL — ?PRAVASTATIN SODIUM 20MG TA: 20 | 30 days supply | Qty: 30 | Fill #2

## 2018-05-20 MED FILL — HYDROCHLOROTHIAZIDE 25 MG T: 25 | 30 days supply | Qty: 30 | Fill #2

## 2018-05-20 MED FILL — ?CARVEDILOL 12.5 MG TABLET: 12.5 | 30 days supply | Qty: 60 | Fill #2

## 2018-05-23 IMAGING — CT CT ABD-PELV W/ CM
2 of 5 series · 16 of 46 positions shown, 18 images · IV contrast (iopamidol)
Comparison: None.

CLINICAL DATA: Newly diagnosed cecal lesion on colonoscopy.

EXAM:
CT ABDOMEN AND PELVIS WITH CONTRAST
TECHNIQUE: Multidetector CT imaging of the abdomen and pelvis was performed
using the standard protocol following bolus administration of
intravenous contrast.
CONTRAST:  100mL 2TTR3N-L55 IOPAMIDOL (2TTR3N-L55) INJECTION 61%

[Series 2: abd/pel w · axial · 0.94mm/px · z∈[-494,-70]mm · 13 of 97 slices shown, 15 images]
[im 6/97  soft-tissue]
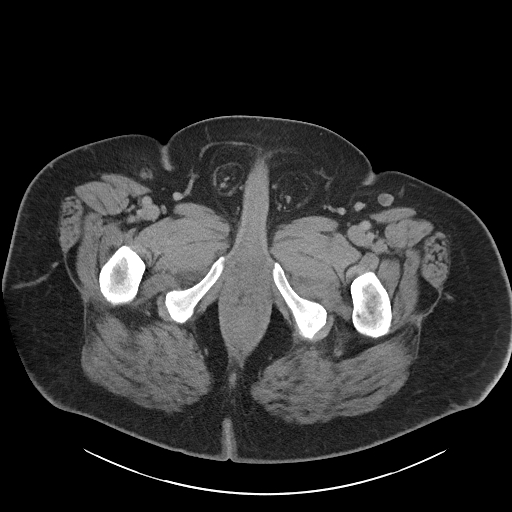
[im 6/97  bone]
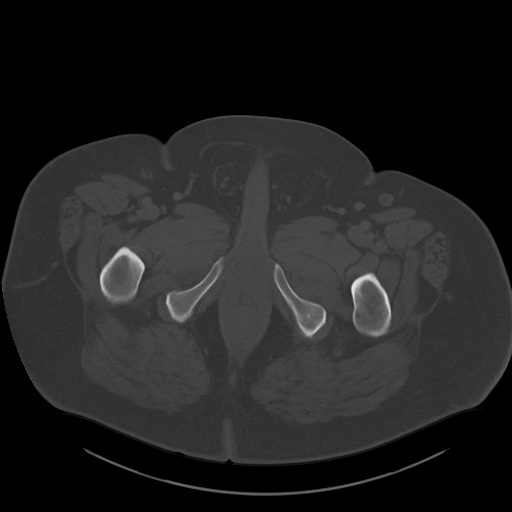
[im 16/97  soft-tissue]
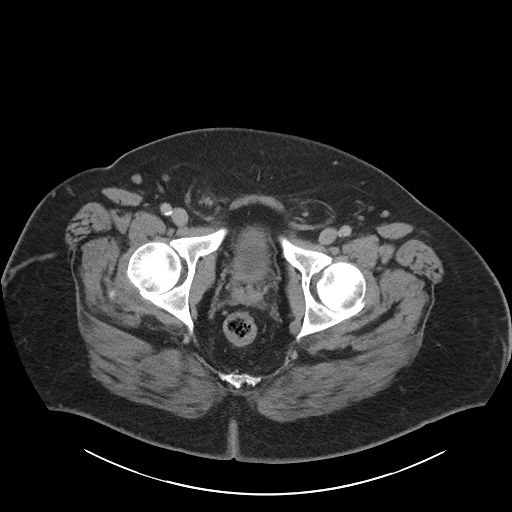
[im 21/97  soft-tissue]
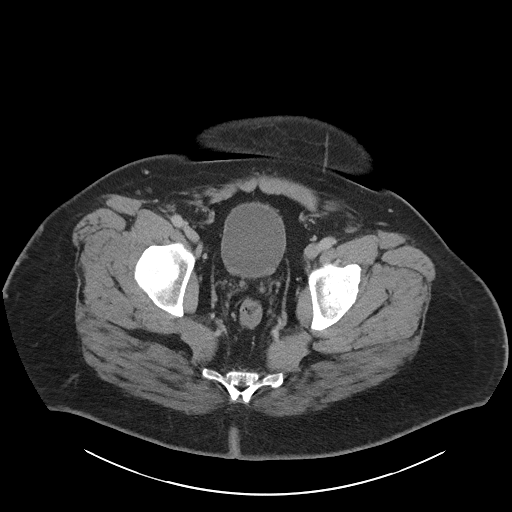
[im 26/97  soft-tissue]
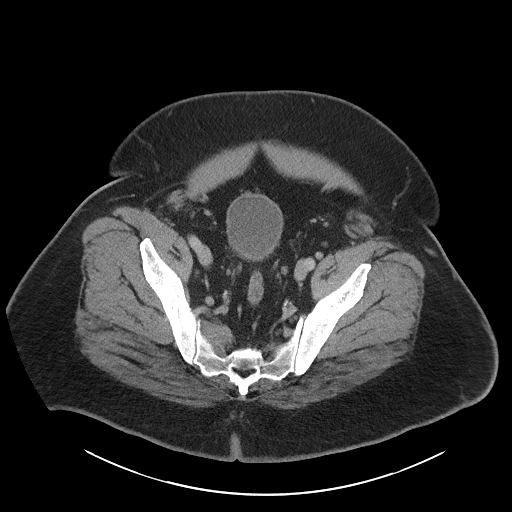
[im 36/97  soft-tissue]
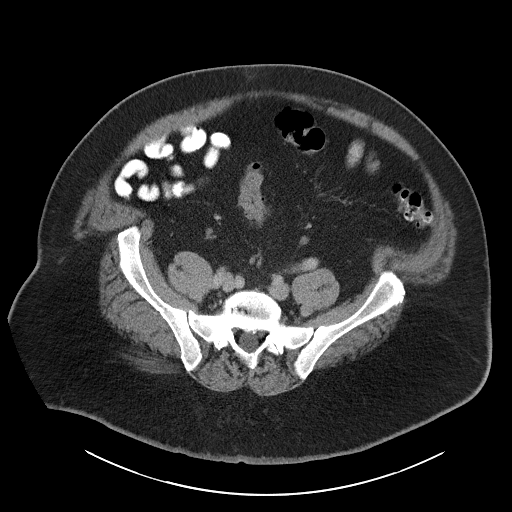
[im 41/97  soft-tissue]
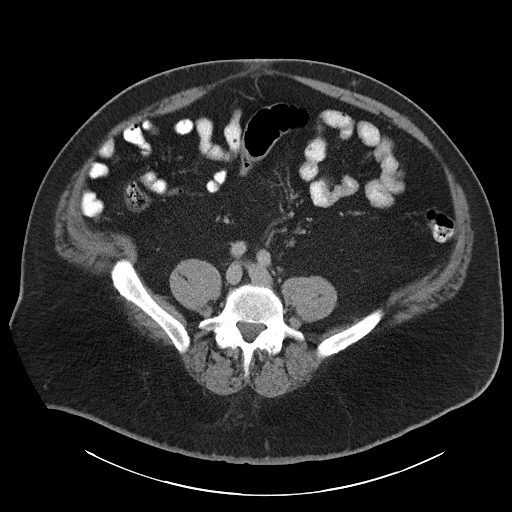
[im 51/97  soft-tissue]
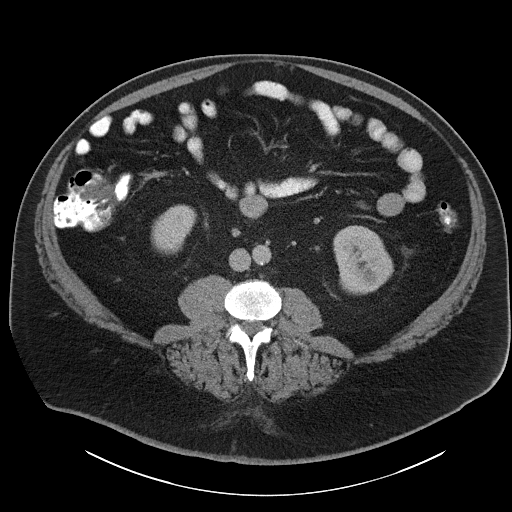
[im 56/97  soft-tissue]
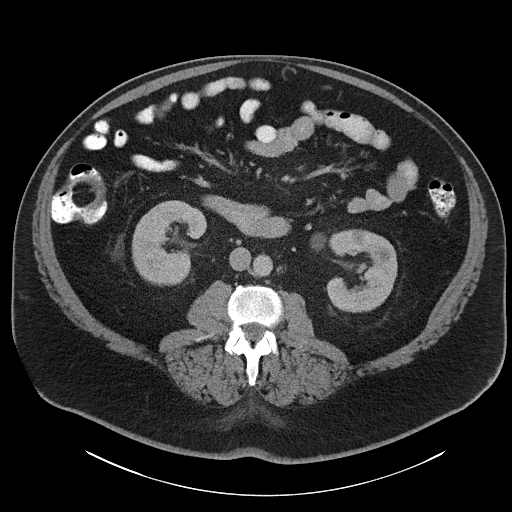
[im 61/97  soft-tissue]
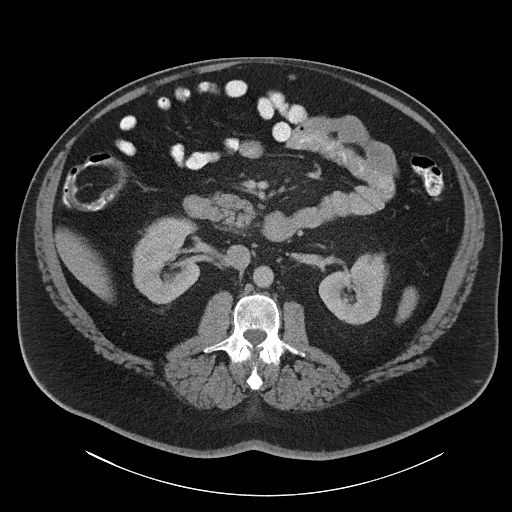
[im 61/97  bone]
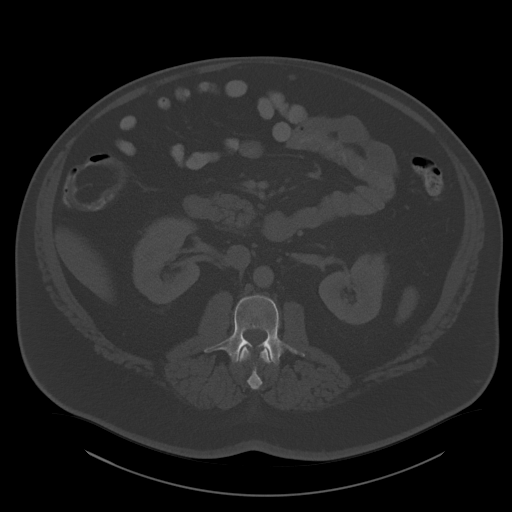
[im 71/97  soft-tissue]
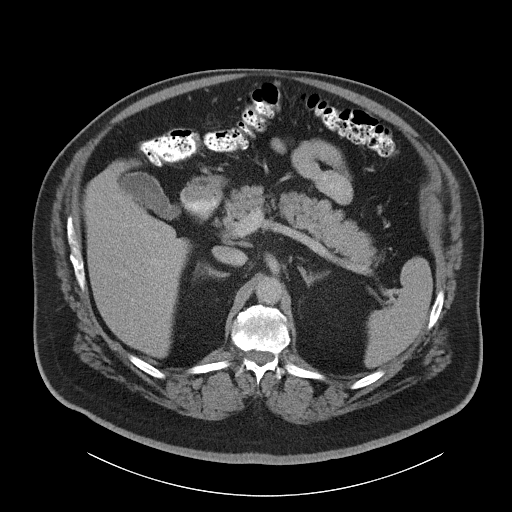
[im 76/97  soft-tissue]
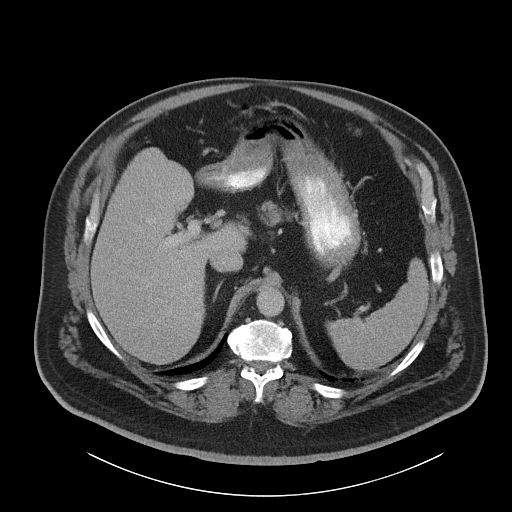
[im 81/97  soft-tissue]
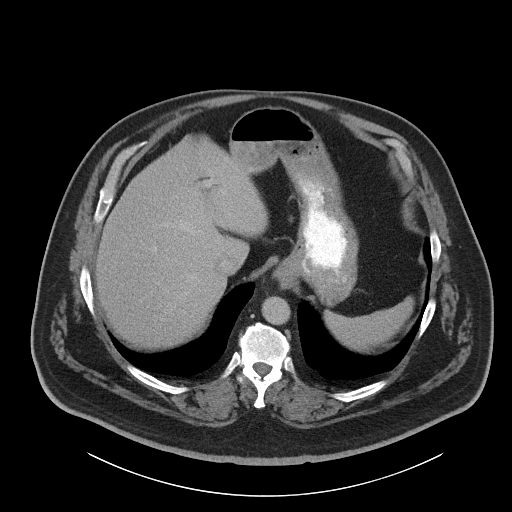
[im 91/97  soft-tissue]
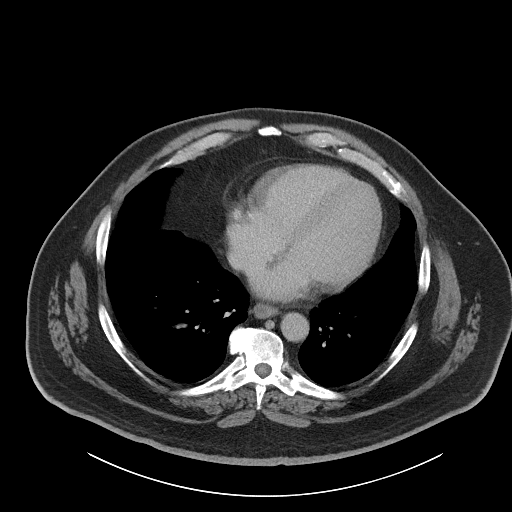

[Series 6: abd/pel w st · coronal · 0.89mm/px · 3 of 121 slices shown]
[im 41/121  soft-tissue]
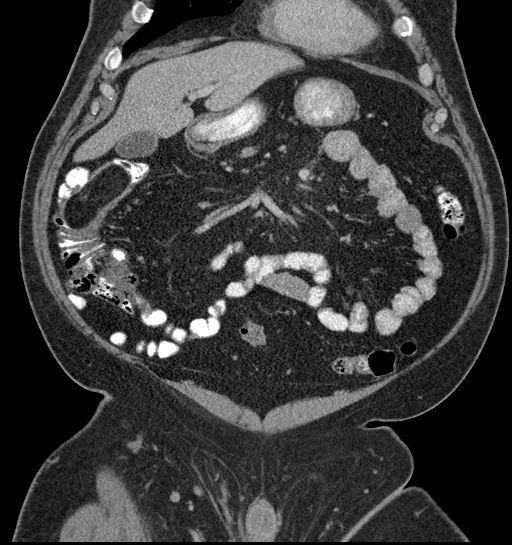
[im 54/121  soft-tissue]
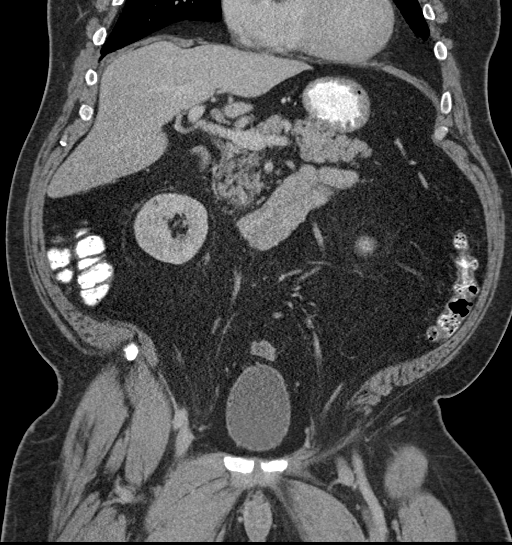
[im 67/121  soft-tissue]
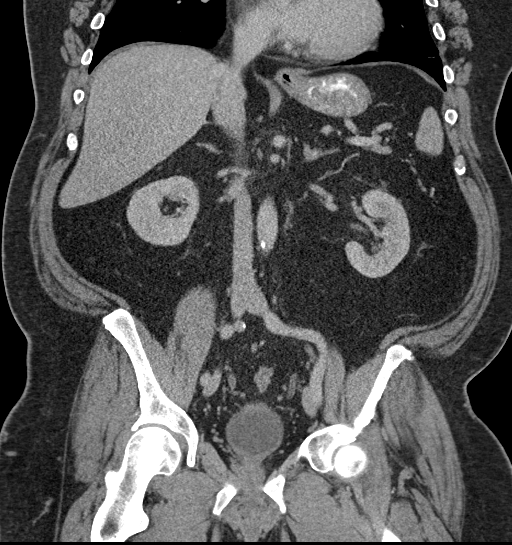

[16 of 46 positions shown; findings below may reference images not displayed]

FINDINGS: Lower chest: Calcified granulomas. Noncalcified nodules measure 2 mm
or less in size and may represent additional granulomas. Heart is
mildly enlarged. No pericardial or pleural effusion. Distal
esophagus is grossly unremarkable.

Hepatobiliary: Liver and gallbladder are unremarkable. No biliary
ductal dilatation.

Pancreas: Negative.

Spleen: Negative.

Adrenals/Urinary Tract: Adrenal glands and right kidney are
unremarkable. Low-attenuation lesions in the left kidney measure up
to 2.2 cm and are likely cysts although definitive characterization
is limited by small size of many lesions. Ureters are decompressed.
Bladder is low in volume.

Stomach/Bowel: Tiny hiatal hernia. Stomach, small bowel and appendix
are unremarkable. There is a large fat density lesion in the
proximal ascending colon measuring approximately 3.6 x 4.0 x 7.0 cm.
It is likely associated with the ileocecal valve (coronal series 6,
image 46). Colon is otherwise unremarkable.

Vascular/Lymphatic: Atherosclerotic calcification of the arterial
vasculature without abdominal aortic aneurysm. No pathologically
enlarged lymph nodes.

Reproductive: Prostate is visualized.

Other: No free fluid. Bilateral inguinal hernias contain fat.
Mesenteries and peritoneum are unremarkable.

Musculoskeletal: No worrisome lytic or sclerotic lesions.
Degenerative changes in the spine.
IMPRESSION: 1. Fat-density mass in the ascending colon appears to be associated
with the ileocecal valve and may represent a lipoma. Patient
underwent recent colonoscopy and presumed biopsy of this lesion.
2.  Aortic atherosclerosis (OTTEF-170.0).
3. Bilateral inguinal hernias contain fat.

## 2018-06-01 ENCOUNTER — Ambulatory Visit: Payer: Self-pay | Attending: Internal Medicine

## 2018-07-01 ENCOUNTER — Ambulatory Visit: Payer: Self-pay | Admitting: Internal Medicine

## 2018-07-07 MED FILL — ?CARVEDILOL 12.5 MG TABLET: 12.5 | 30 days supply | Qty: 60 | Fill #3

## 2018-07-07 MED FILL — HYDROCHLOROTHIAZIDE 25 MG T: 25 | 30 days supply | Qty: 30 | Fill #3

## 2018-07-07 MED FILL — PRAVASTATIN SODIUM 20 MG TA: 20 | 30 days supply | Qty: 30 | Fill #3

## 2018-07-07 MED FILL — LISINOPRIL 40 MG TABLET: 40 | 30 days supply | Qty: 30 | Fill #3

## 2018-07-07 MED FILL — AMLODIPINE BESYLATE 10 MG T: 10 | 90 days supply | Qty: 90 | Fill #2

## 2018-07-07 MED FILL — ?METFORMIN HCL 500MG TABL: 500 | 30 days supply | Qty: 30 | Fill #4

## 2018-08-19 MED FILL — ?PRAVASTATIN NA 20MG TABL: 20 | 30 days supply | Qty: 30 | Fill #4

## 2018-08-19 MED FILL — LISINOPRIL 40 MG TABLET: 40 | 30 days supply | Qty: 30 | Fill #4

## 2018-08-19 MED FILL — ?METFORMIN HCL 500MG TABLET: 500 | 30 days supply | Qty: 30 | Fill #5

## 2018-08-23 ENCOUNTER — Ambulatory Visit: Payer: Self-pay | Admitting: Internal Medicine

## 2018-09-03 ENCOUNTER — Ambulatory Visit: Payer: Self-pay | Admitting: Internal Medicine

## 2018-09-17 MED FILL — LISINOPRIL 40 MG TABLET: 40 | 30 days supply | Qty: 30 | Fill #5

## 2018-09-17 MED FILL — HYDROCHLOROTHIAZIDE 25 MG T: 25 | 30 days supply | Qty: 30 | Fill #4

## 2018-09-17 MED FILL — ?PRAVASTATIN NA 20MG TABL: 20 | 30 days supply | Qty: 30 | Fill #5

## 2018-09-17 MED FILL — ?CARVEDILOL 12.5MG TABLET: 12.5 | 30 days supply | Qty: 60 | Fill #4

## 2018-09-17 MED FILL — ?METFORMIN HCL 500MG TABLET: 500 | 30 days supply | Qty: 30 | Fill #6

## 2018-10-08 ENCOUNTER — Ambulatory Visit: Payer: Self-pay | Attending: Internal Medicine | Admitting: Internal Medicine

## 2018-10-08 ENCOUNTER — Encounter: Payer: Self-pay | Admitting: Internal Medicine

## 2018-10-08 ENCOUNTER — Other Ambulatory Visit: Payer: Self-pay

## 2018-10-08 DIAGNOSIS — E785 Hyperlipidemia, unspecified: Secondary | ICD-10-CM

## 2018-10-08 DIAGNOSIS — R7303 Prediabetes: Secondary | ICD-10-CM

## 2018-10-08 DIAGNOSIS — I10 Essential (primary) hypertension: Secondary | ICD-10-CM

## 2018-10-08 MED ORDER — METFORMIN HCL 500 MG PO TABS: 500.0000 mg | ORAL_TABLET | Freq: Every day | ORAL | 6 refills | Status: DC

## 2018-10-08 MED ORDER — CARVEDILOL 12.5 MG PO TABS: 12.5000 mg | ORAL_TABLET | Freq: Two times a day (BID) | ORAL | 6 refills | Status: DC

## 2018-10-08 MED ORDER — LISINOPRIL 40 MG PO TABS: 40.0000 mg | ORAL_TABLET | Freq: Every day | ORAL | 6 refills | Status: DC

## 2018-10-08 MED ORDER — PRAVASTATIN SODIUM 20 MG PO TABS: 20.0000 mg | ORAL_TABLET | Freq: Every day | ORAL | 6 refills | Status: DC

## 2018-10-08 MED ORDER — HYDROCHLOROTHIAZIDE 25 MG PO TABS: 25.0000 mg | ORAL_TABLET | Freq: Every day | ORAL | 6 refills | Status: DC

## 2018-10-08 MED ORDER — AMLODIPINE BESYLATE 10 MG PO TABS: 10.0000 mg | ORAL_TABLET | Freq: Every day | ORAL | 3 refills | Status: DC

## 2018-10-08 NOTE — Progress Notes (Signed)
Virtual Visit via Telephone Note Due to current restrictions/limitations of in-office visits due to the COVID-19 pandemic, this scheduled clinical appointment was converted to a telehealth visit  I connected with [name] on 10/08/18 at 10:46 a.m EDT by telephone and verified that I am speaking with the correct person using two identifiers. I am in my office.  The patient is at home.  Only the patient and myself participated in this encounter.  I discussed the limitations, risks, security and privacy concerns of performing an evaluation and management service by telephone and the availability of in person appointments. I also discussed with the patient that there may be a patient responsible charge related to this service. The patient expressed understanding and agreed to proceed.   History of Present Illness: Patient with history of HTN, HL, obesity, pre-DM.  Last seen 04/2018.  The purpose of today's visit was chronic disease management follow-up.   HYPERTENSION Currently taking: see medication list Med Adherence: [x]  Yes    []  No Medication side effects: [x]  Yes    [x]  No Adherence with salt restriction: [x]  Yes    []  No Home Monitoring?: [x]  Yes.     Monitoring Frequency: 1-2 wk Home BP results range:  BP today 123/81.  Highest is 130/mid 80 SOB? []  Yes    [x]  No Chest Pain?: []  Yes    [x]  No Leg swelling?: []  Yes    [x]  No Headaches?: []  Yes    [x]  No Dizziness? []  Yes    [x]  No Comments:   Obesity/Pre-DM: current wgh is 280 lbs down 10 lbs since last visit; and down 30 lbs since 01/2018 "I still eat some stuff I shouldn't be eating and eating too much sometimes."  Drinks mainly water.  -tolerating and taking Metformin -had join the gym the end of last yr but stopped going in Feb 2020 due to COVID.  Walks at a park 2-3 x a wk  HL:  Taking Pravachol consistently  Outpatient Encounter Medications as of 10/08/2018  Medication Sig  . amLODipine (NORVASC) 10 MG tablet Take 1  tablet (10 mg total) by mouth daily.  Marland Kitchen aspirin EC 81 MG tablet Take 1 tablet (81 mg total) by mouth daily.  . carvedilol (COREG) 12.5 MG tablet Take 1 tablet (12.5 mg total) by mouth 2 (two) times daily with a meal.  . hydrochlorothiazide (HYDRODIURIL) 25 MG tablet Take 1 tablet (25 mg total) by mouth daily.  Marland Kitchen lisinopril (ZESTRIL) 40 MG tablet Take 1 tablet (40 mg total) by mouth daily.  . metFORMIN (GLUCOPHAGE) 500 MG tablet Take 1 tablet (500 mg total) by mouth daily with breakfast.  . pravastatin (PRAVACHOL) 20 MG tablet Take 1 tablet (20 mg total) by mouth daily.  . [DISCONTINUED] amLODipine (NORVASC) 10 MG tablet Take 1 tablet (10 mg total) by mouth daily.  . [DISCONTINUED] carvedilol (COREG) 12.5 MG tablet Take 1 tablet (12.5 mg total) by mouth 2 (two) times daily with a meal.  . [DISCONTINUED] docusate sodium (COLACE) 100 MG capsule Take 1 capsule (100 mg total) by mouth daily as needed for mild constipation. (Patient not taking: Reported on 04/23/2018)  . [DISCONTINUED] hydrochlorothiazide (HYDRODIURIL) 25 MG tablet Take 1 tablet (25 mg total) by mouth daily.  . [DISCONTINUED] lisinopril (PRINIVIL,ZESTRIL) 40 MG tablet Take 1 tablet (40 mg total) by mouth daily.  . [DISCONTINUED] metFORMIN (GLUCOPHAGE) 500 MG tablet Take 1 tablet (500 mg total) by mouth daily with breakfast.  . [DISCONTINUED] pravastatin (PRAVACHOL) 20 MG tablet Take  1 tablet (20 mg total) by mouth daily.   No facility-administered encounter medications on file as of 10/08/2018.      Observations/Objective: No direct observation done as this was a telephone encounter  Assessment and Plan: 1. Essential hypertension Close to goal.  He will continue his current blood pressure medications.  Continue low-salt diet - amLODipine (NORVASC) 10 MG tablet; Take 1 tablet (10 mg total) by mouth daily.  Dispense: 90 tablet; Refill: 3 - carvedilol (COREG) 12.5 MG tablet; Take 1 tablet (12.5 mg total) by mouth 2 (two) times daily  with a meal.  Dispense: 60 tablet; Refill: 6 - hydrochlorothiazide (HYDRODIURIL) 25 MG tablet; Take 1 tablet (25 mg total) by mouth daily.  Dispense: 30 tablet; Refill: 6 - lisinopril (ZESTRIL) 40 MG tablet; Take 1 tablet (40 mg total) by mouth daily.  Dispense: 30 tablet; Refill: 6 - CBC; Future - Lipid panel; Future - Comprehensive metabolic panel; Future  2. Prediabetes 3. Morbid obesity (HCC) -Commended him on weight loss.  Encouraged him to continue regular exercise.  Dietary counseling given.  Encouraged him to purchase of those plates that is divided into 4 quadrants to help him with portion control - metFORMIN (GLUCOPHAGE) 500 MG tablet; Take 1 tablet (500 mg total) by mouth daily with breakfast.  Dispense: 30 tablet; Refill: 6 - Hemoglobin A1c; Future  4. Hyperlipidemia, unspecified hyperlipidemia type - pravastatin (PRAVACHOL) 20 MG tablet; Take 1 tablet (20 mg total) by mouth daily.  Dispense: 30 tablet; Refill: 6   Follow Up Instructions: F/u in 3 mths   I discussed the assessment and treatment plan with the patient. The patient was provided an opportunity to ask questions and all were answered. The patient agreed with the plan and demonstrated an understanding of the instructions.   The patient was advised to call back or seek an in-person evaluation if the symptoms worsen or if the condition fails to improve as anticipated.  I provided 10 minutes of non-face-to-face time during this encounter.   Scott Blueeborah Sammy Cassar, MD

## 2018-10-12 ENCOUNTER — Other Ambulatory Visit: Payer: Self-pay

## 2018-10-12 ENCOUNTER — Ambulatory Visit: Payer: Self-pay | Attending: Internal Medicine

## 2018-10-12 DIAGNOSIS — I10 Essential (primary) hypertension: Secondary | ICD-10-CM

## 2018-10-12 DIAGNOSIS — R7303 Prediabetes: Secondary | ICD-10-CM

## 2018-10-13 LAB — COMPREHENSIVE METABOLIC PANEL
ALT: 16 IU/L (ref 0–44)
AST: 16 IU/L (ref 0–40)
Albumin/Globulin Ratio: 1.2 (ref 1.2–2.2)
Albumin: 3.7 g/dL — ABNORMAL LOW (ref 3.8–4.9)
Alkaline Phosphatase: 39 IU/L (ref 39–117)
BUN/Creatinine Ratio: 14 (ref 10–24)
BUN: 16 mg/dL (ref 8–27)
Bilirubin Total: 0.5 mg/dL (ref 0.0–1.2)
CO2: 24 mmol/L (ref 20–29)
Calcium: 9.7 mg/dL (ref 8.6–10.2)
Chloride: 97 mmol/L (ref 96–106)
Creatinine, Ser: 1.15 mg/dL (ref 0.76–1.27)
GFR calc Af Amer: 80 mL/min/{1.73_m2} (ref >59)
GFR calc non Af Amer: 69 mL/min/{1.73_m2} (ref >59)
Globulin, Total: 3.1 g/dL (ref 1.5–4.5)
Glucose: 109 mg/dL — ABNORMAL HIGH (ref 65–99)
Potassium: 3.7 mmol/L (ref 3.5–5.2)
Sodium: 140 mmol/L (ref 134–144)
Total Protein: 6.8 g/dL (ref 6.0–8.5)

## 2018-10-13 LAB — CBC
Hematocrit: 42.2 % (ref 37.5–51.0)
Hemoglobin: 14.2 g/dL (ref 13.0–17.7)
MCH: 30.7 pg (ref 26.6–33.0)
MCHC: 33.6 g/dL (ref 31.5–35.7)
MCV: 91 fL (ref 79–97)
Platelets: 231 10*3/uL (ref 150–450)
RBC: 4.63 x10E6/uL (ref 4.14–5.80)
RDW: 12.5 % (ref 11.6–15.4)
WBC: 9.4 10*3/uL (ref 3.4–10.8)

## 2018-10-13 LAB — LIPID PANEL
Chol/HDL Ratio: 4.3 ratio (ref 0.0–5.0)
Cholesterol, Total: 134 mg/dL (ref 100–199)
HDL: 31 mg/dL — ABNORMAL LOW (ref >39)
LDL Calculated: 86 mg/dL (ref 0–99)
Triglycerides: 87 mg/dL (ref 0–149)
VLDL Cholesterol Cal: 17 mg/dL (ref 5–40)

## 2018-10-13 LAB — HEMOGLOBIN A1C
Est. average glucose Bld gHb Est-mCnc: 137 mg/dL
Hgb A1c MFr Bld: 6.4 % — ABNORMAL HIGH (ref 4.8–5.6)

## 2018-11-01 MED FILL — LISINOPRIL 40 MG TAB: 40 | 30 days supply | Qty: 30 | Fill #6

## 2018-11-01 MED FILL — ?METFORMIN HCL 500MG TABLET: 500 | 30 days supply | Qty: 30 | Fill #0

## 2018-11-01 MED FILL — ?PRAVASTATIN NA 20MG TABL: 20 | 30 days supply | Qty: 30 | Fill #6

## 2018-11-01 MED FILL — ?HYDROCHLOROTHIAZIDE 25MG T: 25 | 30 days supply | Qty: 30 | Fill #5

## 2018-11-01 MED FILL — ?CARVEDILOL 12.5MG TABLET: 12.5 | 30 days supply | Qty: 60 | Fill #5

## 2018-12-06 ENCOUNTER — Other Ambulatory Visit: Payer: Self-pay

## 2018-12-06 ENCOUNTER — Ambulatory Visit: Payer: Self-pay | Attending: Internal Medicine

## 2018-12-17 MED FILL — ?HYDROCHLOROTHIAZIDE 25MG T: 25 | 30 days supply | Qty: 30 | Fill #6

## 2018-12-17 MED FILL — ?METFORMIN HCL 500MG TABLET: 500 | 30 days supply | Qty: 30 | Fill #1

## 2018-12-17 MED FILL — ?AMLODIPINE BESYLATE 10 MG: 10 | 90 days supply | Qty: 90 | Fill #0

## 2018-12-17 MED FILL — LISINOPRIL 40 MG TABLET: 40 | 30 days supply | Qty: 30 | Fill #0

## 2018-12-17 MED FILL — ?CARVEDILOL 12.5 MG TABLET: 12.5 | 30 days supply | Qty: 60 | Fill #6

## 2018-12-17 MED FILL — ?PRAVASTATIN NA 20MG TABL: 20 | 30 days supply | Qty: 30 | Fill #2

## 2019-01-13 ENCOUNTER — Other Ambulatory Visit: Payer: Self-pay

## 2019-01-13 ENCOUNTER — Ambulatory Visit: Payer: Self-pay | Attending: Internal Medicine | Admitting: Internal Medicine

## 2019-01-13 ENCOUNTER — Ambulatory Visit (HOSPITAL_BASED_OUTPATIENT_CLINIC_OR_DEPARTMENT_OTHER): Payer: Self-pay | Admitting: Pharmacist

## 2019-01-13 ENCOUNTER — Encounter: Payer: Self-pay | Admitting: Internal Medicine

## 2019-01-13 VITALS — BP 120/80 | HR 73 | Temp 98.6°F | Resp 16 | Ht 67.0 in | Wt 303.2 lb

## 2019-01-13 DIAGNOSIS — Z23 Encounter for immunization: Secondary | ICD-10-CM

## 2019-01-13 DIAGNOSIS — I10 Essential (primary) hypertension: Secondary | ICD-10-CM

## 2019-01-13 DIAGNOSIS — E785 Hyperlipidemia, unspecified: Secondary | ICD-10-CM

## 2019-01-13 DIAGNOSIS — R7303 Prediabetes: Secondary | ICD-10-CM

## 2019-01-13 LAB — POCT GLYCOSYLATED HEMOGLOBIN (HGB A1C): HbA1c, POC (prediabetic range): 6.4 % (ref 5.7–6.4)

## 2019-01-13 LAB — GLUCOSE, POCT (MANUAL RESULT ENTRY): POC Glucose: 137 mg/dl — AB (ref 70–99)

## 2019-01-13 MED ORDER — PRAVASTATIN SODIUM 20 MG PO TABS: 20.0000 mg | ORAL_TABLET | Freq: Every day | ORAL | 6 refills | Status: DC

## 2019-01-13 MED ORDER — HYDROCHLOROTHIAZIDE 25 MG PO TABS: 25.0000 mg | ORAL_TABLET | Freq: Every day | ORAL | 6 refills | Status: DC

## 2019-01-13 MED ORDER — CARVEDILOL 12.5 MG PO TABS: 12.5000 mg | ORAL_TABLET | Freq: Two times a day (BID) | ORAL | 6 refills | Status: DC

## 2019-01-13 MED ORDER — AMLODIPINE BESYLATE 10 MG PO TABS: 10.0000 mg | ORAL_TABLET | Freq: Every day | ORAL | 6 refills | Status: DC

## 2019-01-13 MED FILL — ?HYDROCHLOROTHIAZIDE 25MG T: 25 | 30 days supply | Qty: 30 | Fill #0

## 2019-01-13 MED FILL — ?CARVEDILOL 12.5 MG TABLET: 12.5 | 30 days supply | Qty: 60 | Fill #0

## 2019-01-13 MED FILL — ?PRAVASTATIN NA 20MG TABL: 20 | 30 days supply | Qty: 30 | Fill #0

## 2019-01-13 NOTE — Progress Notes (Signed)
Patient ID: Scott Mcmillan, male    DOB: [birth year]  MRN: [id number]  CC: Diabetes (prediabetes ) and Hypertension   Subjective: Scott Mcmillan is a 58 y.o. male who presents for chronic ds management His concerns today include:  Patient with history of HTN, HL, obesity, pre-DM.  HYPERTENSION Currently taking: see medication list Med Adherence: [x]  Yes    []  No Medication side effects: [x]  Yes    []  No Adherence with salt restriction: [x]  Yes    Home Monitoring?: []  Yes    [x]  No Monitoring Frequency: []  Yes    []  No Home BP results range: []  Yes    []  No SOB? []  Yes    [x]  No Chest Pain?: []  Yes    [x]  No Leg swelling?: [x]  Yes   Headaches?: []  Yes    [x]  No Dizziness? []  Yes    [x]  No Comments:   HL:  Tolerating Pravachol  PreDM/Obesity:  Gained the 10 lbs back which he had loss plus 3 additional lbs.  Attributes this to "just sitting around eating too much while watching TV."  He use to walk at park but parks were close during height of COVID pandemic.  Just recently reopened.  Patient Active Problem List   Diagnosis Date Noted  . Influenza vaccine needed 01/13/2019  . Non-recurrent bilateral inguinal hernia without obstruction or gangrene 04/23/2018  . Hemorrhoids 01/22/2018  . Prediabetes 01/22/2018  . Actinic keratosis due to exposure to sunlight 07/21/2017  . Lower extremity edema 07/21/2017  . Class 3 severe obesity due to excess calories without serious comorbidity with body mass index (BMI) of 50.0 to 59.9 in adult (HCC) 07/21/2017  . Hyperlipidemia with target low density lipoprotein (LDL) cholesterol less than 100 mg/dL 40/98/119107/16/2018  . Hypertension 10/31/2016     Current Outpatient Medications on File Prior to Visit  Medication Sig Dispense Refill  . aspirin EC 81 MG tablet Take 1 tablet (81 mg total) by mouth daily. 90 tablet 3  . lisinopril (ZESTRIL) 40 MG tablet Take 1 tablet (40 mg total) by mouth daily. 30 tablet 6  . metFORMIN (GLUCOPHAGE) 500 MG tablet  Take 1 tablet (500 mg total) by mouth daily with breakfast. 30 tablet 6   No current facility-administered medications on file prior to visit.     No Known Allergies  Social History   Socioeconomic History  . Marital status: Single    Spouse name: Not on file  . Number of children: Not on file  . Years of education: Not on file  . Highest education level: Not on file  Occupational History  . Not on file  Social Needs  . Financial resource strain: Not on file  . Food insecurity    Worry: Not on file    Inability: Not on file  . Transportation needs    Medical: Not on file    Non-medical: Not on file  Tobacco Use  . Smoking status: Never Smoker  . Smokeless tobacco: Never Used  Substance and Sexual Activity  . Alcohol use: No  . Drug use: No  . Sexual activity: Not on file  Lifestyle  . Physical activity    Days per week: Not on file    Minutes per session: Not on file  . Stress: Not on file  Relationships  . Social Musicianconnections    Talks on phone: Not on file    Gets together: Not on file    Attends religious service: Not on file  Active member of club or organization: Not on file    Attends meetings of clubs or organizations: Not on file    Relationship status: Not on file  . Intimate partner violence    Fear of current or ex partner: Not on file    Emotionally abused: Not on file    Physically abused: Not on file    Forced sexual activity: Not on file  Other Topics Concern  . Not on file  Social History Narrative  . Not on file    Family History  Problem Relation Age of Onset  . Colon cancer Neg Hx   . Esophageal cancer Neg Hx   . Liver cancer Neg Hx   . Pancreatic cancer Neg Hx   . Rectal cancer Neg Hx   . Stomach cancer Neg Hx     No past surgical history on file.  ROS: Review of Systems Negative except as stated above  PHYSICAL EXAM: BP 120/80   Pulse 73   Temp 98.6 F (37 C) (Oral)   Resp 16   Ht 5\' 7"  (1.702 m)   Wt (!) 303 lb 3.2  oz (137.5 kg)   SpO2 95%   BMI 47.49 kg/m   Wt Readings from Last 3 Encounters:  01/13/19 (!) 303 lb 3.2 oz (137.5 kg)  04/23/18 290 lb (131.5 kg)  03/09/18 299 lb (135.6 kg)    Physical Exam  General appearance - alert, well appearing, obese older Caucasian male and in no distress Mental status - normal mood, behavior, speech, dress, motor activity, and thought processes Eyes - pupils equal and reactive, extraocular eye movements intact Mouth - mucous membranes moist, pharynx normal without lesions Neck - supple, no significant adenopathy Chest - clear to auscultation, no wheezes, rales or rhonchi, symmetric air entry Heart - normal rate, regular rhythm, normal S1, S2, no murmurs, rubs, clicks or gallops Extremities -trace lower extremity edema. Skin: Dry flaky skin on lower legs and ankles  Results for orders placed or performed in visit on 01/13/19  POCT glucose (manual entry)  Result Value Ref Range   POC Glucose 137 (A) 70 - 99 mg/dl  POCT glycosylated hemoglobin (Hb A1C)  Result Value Ref Range   Hemoglobin A1C     HbA1c POC (<> result, manual entry)     HbA1c, POC (prediabetic range) 6.4 5.7 - 6.4 %   HbA1c, POC (controlled diabetic range)      CMP Latest Ref Rng & Units 10/12/2018 10/22/2017 10/31/2016  Glucose 65 - 99 mg/dL 109(H) 118(H) 96  BUN 8 - 27 mg/dL 16 12 14   Creatinine 0.76 - 1.27 mg/dL 1.15 0.89 1.00  Sodium 134 - 144 mmol/L 140 139 138  Potassium 3.5 - 5.2 mmol/L 3.7 3.8 3.7  Chloride 96 - 106 mmol/L 97 99 96  CO2 20 - 29 mmol/L 24 24 25   Calcium 8.6 - 10.2 mg/dL 9.7 9.9 9.9  Total Protein 6.0 - 8.5 g/dL 6.8 7.4 7.9  Total Bilirubin 0.0 - 1.2 mg/dL 0.5 0.6 0.6  Alkaline Phos 39 - 117 IU/L 39 46 60  AST 0 - 40 IU/L 16 20 21   ALT 0 - 44 IU/L 16 19 20    Lipid Panel     Component Value Date/Time   CHOL 134 10/12/2018 0852   TRIG 87 10/12/2018 0852   HDL 31 (L) 10/12/2018 0852   CHOLHDL 4.3 10/12/2018 0852   LDLCALC 86 10/12/2018 0852    CBC     Component Value  Date/Time   WBC 9.4 10/12/2018 0852   WBC 12.1 (H) 09/23/2016 0953   RBC 4.63 10/12/2018 0852   RBC 4.66 09/23/2016 0953   HGB 14.2 10/12/2018 0852   HCT 42.2 10/12/2018 0852   PLT 231 10/12/2018 0852   MCV 91 10/12/2018 0852   MCH 30.7 10/12/2018 0852   MCH 29.2 09/23/2016 0953   MCHC 33.6 10/12/2018 0852   MCHC 32.1 09/23/2016 0953   RDW 12.5 10/12/2018 0852    ASSESSMENT AND PLAN:  1. Essential hypertension At goal.  Continue current medications and low-salt diet - carvedilol (COREG) 12.5 MG tablet; Take 1 tablet (12.5 mg total) by mouth 2 (two) times daily with a meal.  Dispense: 60 tablet; Refill: 6 - hydrochlorothiazide (HYDRODIURIL) 25 MG tablet; Take 1 tablet (25 mg total) by mouth daily.  Dispense: 30 tablet; Refill: 6 - amLODipine (NORVASC) 10 MG tablet; Take 1 tablet (10 mg total) by mouth daily.  Dispense: 30 tablet; Refill: 6  2. Prediabetes A1c right at 6.4. Discussed healthy eating habits.  Patient will try to do better at meal planning to avoid eating junk foods.  He will cut back on white carbohydrates.  We discussed having fresh fruits as snacks. -Encouraged him to get back to walking at the park.  He plans to start doing it again at least 2-3 times a week. - POCT glucose (manual entry) - POCT glycosylated hemoglobin (Hb A1C)  3. Morbid obesity (HCC) See #2 above  4. Hyperlipidemia, unspecified hyperlipidemia type - pravastatin (PRAVACHOL) 20 MG tablet; Take 1 tablet (20 mg total) by mouth daily.  Dispense: 30 tablet; Refill: 6  5. Influenza vaccine needed Given  6.  Dry skin Advised trying Curel lotion over-the-counter  Patient was given the opportunity to ask questions.  Patient verbalized understanding of the plan and was able to repeat key elements of the plan.   Orders Placed This Encounter  Procedures  . POCT glucose (manual entry)  . POCT glycosylated hemoglobin (Hb A1C)     Requested Prescriptions   Signed Prescriptions  Disp Refills  . carvedilol (COREG) 12.5 MG tablet 60 tablet 6    Sig: Take 1 tablet (12.5 mg total) by mouth 2 (two) times daily with a meal.  . hydrochlorothiazide (HYDRODIURIL) 25 MG tablet 30 tablet 6    Sig: Take 1 tablet (25 mg total) by mouth daily.  . pravastatin (PRAVACHOL) 20 MG tablet 30 tablet 6    Sig: Take 1 tablet (20 mg total) by mouth daily.  Marland Kitchen amLODipine (NORVASC) 10 MG tablet 30 tablet 6    Sig: Take 1 tablet (10 mg total) by mouth daily.    Return in about 3 months (around 04/14/2019).  Jonah Blue, MD, FACP

## 2019-01-13 NOTE — Patient Instructions (Addendum)
Obesity, Adult Obesity is having too much body fat. Being obese means that your weight is more than what is healthy for you. BMI is a number that explains how much body fat you have. If you have a BMI of 30 or more, you are obese. Obesity is often caused by eating or drinking more calories than your body uses. Changing your lifestyle can help you lose weight. Obesity can cause serious health problems, such as:  Stroke.  Coronary artery disease (CAD).  Type 2 diabetes.  Some types of cancer, including cancers of the colon, breast, uterus, and gallbladder.  Osteoarthritis.  High blood pressure (hypertension).  High cholesterol.  Sleep apnea.  Gallbladder stones.  Infertility problems. What are the causes?  Eating meals each day that are high in calories, sugar, and fat.  Being born with genes that may make you more likely to become obese.  Having a medical condition that causes obesity.  Taking certain medicines.  Sitting a lot (having a sedentary lifestyle).  Not getting enough sleep.  Drinking a lot of drinks that have sugar in them. What increases the risk?  Having a family history of obesity.  Being an African American woman.  Being a Hispanic man.  Living in an area with limited access to: ? Parks, recreation centers, or sidewalks. ? Healthy food choices, such as grocery stores and farmers' markets. What are the signs or symptoms? The main sign is having too much body fat. How is this treated?  Treatment for this condition often includes changing your lifestyle. Treatment may include: ? Changing your diet. This may include making a healthy meal plan. ? Exercise. This may include activity that causes your heart to beat faster (aerobic exercise) and strength training. Work with your doctor to design a program that works for you. ? Medicine to help you lose weight. This may be used if you are not able to lose 1 pound a week after 6 weeks of healthy eating and  more exercise. ? Treating conditions that cause the obesity. ? Surgery. Options may include gastric banding and gastric bypass. This may be done if:  Other treatments have not helped to improve your condition.  You have a BMI of 40 or higher.  You have life-threatening health problems related to obesity. Follow these instructions at home: Eating and drinking   Follow advice from your doctor about what to eat and drink. Your doctor may tell you to: ? Limit fast food, sweets, and processed snack foods. ? Choose low-fat options. For example, choose low-fat milk instead of whole milk. ? Eat 5 or more servings of fruits or vegetables each day. ? Eat at home more often. This gives you more control over what you eat. ? Choose healthy foods when you eat out. ? Learn to read food labels. This will help you learn how much food is in 1 serving. ? Keep low-fat snacks available. ? Avoid drinks that have a lot of sugar in them. These include soda, fruit juice, iced tea with sugar, and flavored milk.  Drink enough water to keep your pee (urine) pale yellow.  Do not go on fad diets. Physical activity  Exercise often, as told by your doctor. Most adults should get up to 150 minutes of moderate-intensity exercise every week.Ask your doctor: ? What types of exercise are safe for you. ? How often you should exercise.  Warm up and stretch before being active.  Do slow stretching after being active (cool down).  Rest between   times of being active. Lifestyle  Work with your doctor and a food expert (dietitian) to set a weight-loss goal that is best for you.  Limit your screen time.  Find ways to reward yourself that do not involve food.  Do not drink alcohol if: ? Your doctor tells you not to drink. ? You are pregnant, may be pregnant, or are planning to become pregnant.  If you drink alcohol: ? Limit how much you use to:  0-1 drink a day for women.  0-2 drinks a day for men. ? Be  aware of how much alcohol is in your drink. In the [place] one drink equals one 12 oz bottle of beer (355 mL), one 5 oz glass of wine (148 mL), or one 1 oz glass of hard liquor (44 mL). General instructions  Keep a weight-loss journal. This can help you keep track of: ? The food that you eat. ? How much exercise you get.  Take over-the-counter and prescription medicines only as told by your doctor.  Take vitamins and supplements only as told by your doctor.  Think about joining a support group.  Keep all follow-up visits as told by your doctor. This is important. Contact a doctor if:  You cannot meet your weight loss goal after you have changed your diet and lifestyle for 6 weeks. Get help right away if you:  Are having trouble breathing.  Are having thoughts of harming yourself. Summary  Obesity is having too much body fat.  Being obese means that your weight is more than what is healthy for you.  Work with your doctor to set a weight-loss goal.  Get regular exercise as told by your doctor. This information is not intended to replace advice given to you by your health care provider. Make sure you discuss any questions you have with your health care provider. Document Released: 07/14/2011 Document Revised: 12/24/2017 Document Reviewed: 12/24/2017 Elsevier Patient Education  [place].   Influenza Virus Vaccine injection (Fluarix) What is this medicine? INFLUENZA VIRUS VACCINE (in floo EN zuh VAHY ruhs vak SEEN) helps to reduce the risk of getting influenza also known as the flu. This medicine may be used for other purposes; ask your health care provider or pharmacist if you have questions. COMMON BRAND NAME(S): Fluarix, Fluzone What should I tell my health care provider before I take this medicine? They need to know if you have any of these conditions:  bleeding disorder like hemophilia  fever or infection  Guillain-Barre syndrome or other neurological  problems  immune system problems  infection with the human immunodeficiency virus (HIV) or AIDS  low blood platelet counts  multiple sclerosis  an unusual or allergic reaction to influenza virus vaccine, eggs, chicken proteins, latex, gentamicin, other medicines, foods, dyes or preservatives  pregnant or trying to get pregnant  breast-feeding How should I use this medicine? This vaccine is for injection into a muscle. It is given by a health care professional. A copy of Vaccine Information Statements will be given before each vaccination. Read this sheet carefully each time. The sheet may change frequently. Talk to your pediatrician regarding the use of this medicine in children. Special care may be needed. Overdosage: If you think you have taken too much of this medicine contact a poison control center or emergency room at once. NOTE: This medicine is only for you. Do not share this medicine with others. What if I miss a dose? This does not apply. What may interact with  this medicine?  chemotherapy or radiation therapy  medicines that lower your immune system like etanercept, anakinra, infliximab, and adalimumab  medicines that treat or prevent blood clots like warfarin  phenytoin  steroid medicines like prednisone or cortisone  theophylline  vaccines This list may not describe all possible interactions. Give your health care provider a list of all the medicines, herbs, non-prescription drugs, or dietary supplements you use. Also tell them if you smoke, drink alcohol, or use illegal drugs. Some items may interact with your medicine. What should I watch for while using this medicine? Report any side effects that do not go away within 3 days to your doctor or health care professional. Call your health care provider if any unusual symptoms occur within 6 weeks of receiving this vaccine. You may still catch the flu, but the illness is not usually as bad. You cannot get the flu  from the vaccine. The vaccine will not protect against colds or other illnesses that may cause fever. The vaccine is needed every year. What side effects may I notice from receiving this medicine? Side effects that you should report to your doctor or health care professional as soon as possible:  allergic reactions like skin rash, itching or hives, swelling of the face, lips, or tongue Side effects that usually do not require medical attention (report to your doctor or health care professional if they continue or are bothersome):  fever  headache  muscle aches and pains  pain, tenderness, redness, or swelling at site where injected  weak or tired This list may not describe all possible side effects. Call your doctor for medical advice about side effects. You may report side effects to FDA at 1-800-FDA-1088. Where should I keep my medicine? This vaccine is only given in a clinic, pharmacy, doctor's office, or other health care setting and will not be stored at home. NOTE: This sheet is a summary. It may not cover all possible information. If you have questions about this medicine, talk to your doctor, pharmacist, or health care provider.  2020 Elsevier/Gold Standard (2007-11-17 09:30:40)  

## 2019-01-13 NOTE — Progress Notes (Signed)
Patient presents for vaccination against influenza per orders of Dr. Johnson. Consent given. Counseling provided. No contraindications exists. Vaccine administered without incident.   

## 2019-01-26 MED FILL — LISINOPRIL 40 MG TABLET: 40 | 30 days supply | Qty: 30 | Fill #1

## 2019-01-26 MED FILL — ?CARVEDILOL 12.5 MG TABLET: 12.5 | 30 days supply | Qty: 60 | Fill #0

## 2019-01-26 MED FILL — ?PRAVASTATIN NA 20MG TABL: 20 | 30 days supply | Qty: 30 | Fill #0

## 2019-01-26 MED FILL — ?HYDROCHLOROTHIAZIDE 25MG T: 25 | 30 days supply | Qty: 30 | Fill #0

## 2019-01-26 MED FILL — ?METFORMIN HCL 500MG TABLET: 500 | 30 days supply | Qty: 30 | Fill #2

## 2019-03-01 MED FILL — ?METFORMIN HCL 500MG TABLET: 500 | 30 days supply | Qty: 30 | Fill #3

## 2019-03-01 MED FILL — LISINOPRIL 40 MG TABLET: 40 | 30 days supply | Qty: 30 | Fill #2

## 2019-03-01 MED FILL — ?HYDROCHLOROTHIAZIDE 25MG T: 25 | 30 days supply | Qty: 30 | Fill #1

## 2019-03-01 MED FILL — ?PRAVASTATIN NA 20MG TABL: 20 | 30 days supply | Qty: 30 | Fill #1

## 2019-03-01 MED FILL — ?CARVEDILOL 12.5 MG TABLET: 12.5 | 30 days supply | Qty: 60 | Fill #1

## 2019-04-07 MED FILL — ?HYDROCHLOROTHIAZIDE 25MG T: 25 | 30 days supply | Qty: 30 | Fill #2

## 2019-04-07 MED FILL — ?METFORMIN HCL 500MG TABLET: 500 | 30 days supply | Qty: 30 | Fill #4

## 2019-04-07 MED FILL — PRAVASTATIN SODIUM 20 MG TA: 20 | 30 days supply | Qty: 30 | Fill #2

## 2019-04-07 MED FILL — ?AMLODIPINE BESYLATE 10 MG: 10 | 30 days supply | Qty: 30 | Fill #0

## 2019-04-07 MED FILL — LISINOPRIL 40 MG TABLET: 40 | 30 days supply | Qty: 30 | Fill #3

## 2019-04-07 MED FILL — ?CARVEDILOL 12.5 MG TABLET: 12.5 | 30 days supply | Qty: 60 | Fill #2

## 2019-05-05 ENCOUNTER — Ambulatory Visit: Payer: Self-pay | Attending: Internal Medicine | Admitting: Internal Medicine

## 2019-05-05 ENCOUNTER — Telehealth: Payer: Self-pay

## 2019-05-05 ENCOUNTER — Encounter: Payer: Self-pay | Admitting: Internal Medicine

## 2019-05-05 ENCOUNTER — Other Ambulatory Visit: Payer: Self-pay

## 2019-05-05 DIAGNOSIS — E785 Hyperlipidemia, unspecified: Secondary | ICD-10-CM

## 2019-05-05 DIAGNOSIS — I10 Essential (primary) hypertension: Secondary | ICD-10-CM

## 2019-05-05 DIAGNOSIS — R7303 Prediabetes: Secondary | ICD-10-CM

## 2019-05-05 NOTE — Progress Notes (Signed)
Virtual Visit via Telephone Note Due to current restrictions/limitations of in-office visits due to the COVID-19 pandemic, this scheduled clinical appointment was converted to a telehealth visit  I connected with Scott Mcmillan on 05/05/19 at 11:35 a.m EST by telephone and verified that I am speaking with the correct person using two identifiers. I am in my office.  The patient is at home.  Only the patient and myself participated in this encounter.  I discussed the limitations, risks, security and privacy concerns of performing an evaluation and management service by telephone and the availability of in person appointments. I also discussed with the patient that there may be a patient responsible charge related to this service. The patient expressed understanding and agreed to proceed.  History of Present Illness: Patient with history of HTN, HL, morbid obesity, pre-DM.  Last seen 01/2019.  Purpose of today's visit is chronic disease management.  HYPERTENSION Currently taking: see medication list Med Adherence: [x]  Yes.  He is on amlodipine, carvedilol, hydrochlorothiazide and lisinopril    []  No Medication side effects: []  Yes    [x]  No Adherence with salt restriction: [x]  Yes    []  No Home Monitoring?: [x]  Yes    []  No Monitoring Frequency:  A few times a wk  Home BP results range: 124/80, 127/81 SOB? []  Yes    [x]  No Chest Pain?: []  Yes    [x]  No Leg swelling?: [x]  Yes - left leg always swollen but not more than it usually is  Headaches?: []  Yes    [x]  No Dizziness? []  Yes    [x]  No Comments:   HL:  Tolerating Pravachol  PreDM/Obesity: wgh today is 298 down 5 lbs since last visit. Admits that he over ate a little during the holidays but tries to be mindful.  Started walking again once a wk at the park.  It is about 1.5 miles.  Did not get out in past 2 wks   Outpatient Encounter Medications as of 05/05/2019  Medication Sig  . amLODipine (NORVASC) 10 MG tablet Take 1 tablet (10 mg  total) by mouth daily.  Marland Kitchen aspirin EC 81 MG tablet Take 1 tablet (81 mg total) by mouth daily.  . carvedilol (COREG) 12.5 MG tablet Take 1 tablet (12.5 mg total) by mouth 2 (two) times daily with a meal.  . hydrochlorothiazide (HYDRODIURIL) 25 MG tablet Take 1 tablet (25 mg total) by mouth daily.  Marland Kitchen lisinopril (ZESTRIL) 40 MG tablet Take 1 tablet (40 mg total) by mouth daily.  . metFORMIN (GLUCOPHAGE) 500 MG tablet Take 1 tablet (500 mg total) by mouth daily with breakfast.  . pravastatin (PRAVACHOL) 20 MG tablet Take 1 tablet (20 mg total) by mouth daily.   No facility-administered encounter medications on file as of 05/05/2019.     Observations/Objective: Results for orders placed or performed in visit on 01/13/19  POCT glucose (manual entry)  Result Value Ref Range   POC Glucose 137 (A) 70 - 99 mg/dl  POCT glycosylated hemoglobin (Hb A1C)  Result Value Ref Range   Hemoglobin A1C     HbA1c POC (<> result, manual entry)     HbA1c, POC (prediabetic range) 6.4 5.7 - 6.4 %   HbA1c, POC (controlled diabetic range)       Chemistry      Component Value Date/Time   NA 140 10/12/2018 0852   K 3.7 10/12/2018 0852   CL 97 10/12/2018 0852   CO2 24 10/12/2018 0852   BUN 16 10/12/2018  0630   CREATININE 1.15 10/12/2018 0852      Component Value Date/Time   CALCIUM 9.7 10/12/2018 0852   ALKPHOS 39 10/12/2018 0852   AST 16 10/12/2018 0852   ALT 16 10/12/2018 0852   BILITOT 0.5 10/12/2018 0852     Lab Results  Component Value Date   WBC 9.4 10/12/2018   HGB 14.2 10/12/2018   HCT 42.2 10/12/2018   MCV 91 10/12/2018   PLT 231 10/12/2018   Lab Results  Component Value Date   CHOL 134 10/12/2018   HDL 31 (L) 10/12/2018   LDLCALC 86 10/12/2018   TRIG 87 10/12/2018   CHOLHDL 4.3 10/12/2018   Assessment and Plan: 1. Essential hypertension Reported home blood pressure readings are good.  He will continue current medications and low-salt diet.  2. Prediabetes 3. Morbid obesity  (HCC) Encouraged him to be mindful of portion sizes and trying to avoid sweet treats during the holidays.  Commended him on 5 pound weight loss.  Encouraged him to try to walk more.  He has set a goal to try to get out twice a week to walk at the park.  4. Hyperlipidemia, unspecified hyperlipidemia type Continue pravastatin.   Follow Up Instructions: 4 mths   I discussed the assessment and treatment plan with the patient. The patient was provided an opportunity to ask questions and all were answered. The patient agreed with the plan and demonstrated an understanding of the instructions.   The patient was advised to call back or seek an in-person evaluation if the symptoms worsen or if the condition fails to improve as anticipated.  I provided 9 minutes of non-face-to-face time during this encounter.   Jonah Blue, MD

## 2019-05-05 NOTE — Telephone Encounter (Signed)
Contacted pt to schedule 4 month f/u pt didn't answer lvm asking pt to give a call back to schedule  

## 2019-05-10 ENCOUNTER — Ambulatory Visit: Payer: Self-pay | Admitting: Internal Medicine

## 2019-05-12 MED FILL — ?METFORMIN HCL 500MG TABLET: 500 | 30 days supply | Qty: 30 | Fill #5

## 2019-05-12 MED FILL — ?HYDROCHLOROTHIAZIDE 25MG T: 25 | 30 days supply | Qty: 30 | Fill #3

## 2019-05-12 MED FILL — ?AMLODIPINE BESYLATE 10 MG: 10 | 30 days supply | Qty: 30 | Fill #1

## 2019-05-12 MED FILL — PRAVASTATIN SODIUM 20 MG TA: 20 | 30 days supply | Qty: 30 | Fill #3

## 2019-05-12 MED FILL — ?CARVEDILOL 12.5 MG TABLET: 12.5 | 30 days supply | Qty: 60 | Fill #3

## 2019-05-12 MED FILL — LISINOPRIL 40 MG TABLET: 40 | 30 days supply | Qty: 30 | Fill #4

## 2019-06-20 ENCOUNTER — Ambulatory Visit: Payer: Self-pay | Attending: Internal Medicine

## 2019-06-20 ENCOUNTER — Other Ambulatory Visit: Payer: Self-pay

## 2019-06-20 MED FILL — ?CARVEDILOL 12.5 MG TABLET: 12.5 | 30 days supply | Qty: 60 | Fill #4

## 2019-06-20 MED FILL — ?HYDROCHLOROTHIAZIDE 25MG T: 25 | 30 days supply | Qty: 30 | Fill #4

## 2019-06-20 MED FILL — ?PRAVASTATIN NA 20MG TABL: 20 | 30 days supply | Qty: 30 | Fill #4

## 2019-06-20 MED FILL — ?AMLODIPINE BESYL 10MG TABL: 10 | 30 days supply | Qty: 30 | Fill #2

## 2019-06-20 MED FILL — ?METFORMIN HCL 500MG TABLET: 500 | 30 days supply | Qty: 30 | Fill #6

## 2019-06-20 MED FILL — LISINOPRIL 40 MG TABLET: 40 | 30 days supply | Qty: 30 | Fill #5

## 2019-07-14 ENCOUNTER — Ambulatory Visit: Payer: Self-pay | Admitting: Internal Medicine

## 2019-07-21 ENCOUNTER — Other Ambulatory Visit: Payer: Self-pay | Admitting: Internal Medicine

## 2019-07-21 DIAGNOSIS — R7303 Prediabetes: Secondary | ICD-10-CM

## 2019-07-21 MED FILL — LISINOPRIL 40 MG TABLET: 40 | 30 days supply | Qty: 30 | Fill #6

## 2019-07-21 MED FILL — AMLODIPINE BESYLATE 10 MG T: 10 | 30 days supply | Qty: 30 | Fill #3

## 2019-07-21 MED FILL — ?CARVEDILOL 12.5 MG TABLET: 12.5 | 30 days supply | Qty: 60 | Fill #5

## 2019-07-21 MED FILL — HYDROCHLOROTHIAZIDE 25 MG T: 25 | 30 days supply | Qty: 30 | Fill #5

## 2019-07-21 MED FILL — ?PRAVASTATIN NA 20MG TABL: 20 | 30 days supply | Qty: 30 | Fill #5

## 2019-07-22 ENCOUNTER — Other Ambulatory Visit: Payer: Self-pay | Admitting: Internal Medicine

## 2019-07-22 DIAGNOSIS — R7303 Prediabetes: Secondary | ICD-10-CM

## 2019-07-22 MED FILL — ?METFORMIN HCL 500MG TABLET: 500 | 30 days supply | Qty: 30 | Fill #0

## 2019-07-28 ENCOUNTER — Ambulatory Visit: Payer: Self-pay | Attending: Internal Medicine

## 2019-07-28 DIAGNOSIS — Z23 Encounter for immunization: Secondary | ICD-10-CM

## 2019-07-28 NOTE — Progress Notes (Signed)
   Covid-19 Vaccination Clinic  Name:  Scott Mcmillan    MRN: [id number] DOB: 1960/05/17  07/28/2019  [name] was observed post Covid-19 immunization for 15 minutes without incident. He was provided with Vaccine Information Sheet and instruction to access the V-Safe system.   [name] was instructed to call 911 with any severe reactions post vaccine: Marland Kitchen Difficulty breathing  . Swelling of face and throat  . A fast heartbeat  . A bad rash all over body  . Dizziness and weakness   Immunizations Administered    Name Date Dose VIS Date Route   Pfizer COVID-19 Vaccine 07/28/2019 10:47 AM 0.3 mL 04/15/2019 Intramuscular   Manufacturer: [employer], [employer]   Lot: [id number]   NDC: [address]-1

## 2019-08-11 ENCOUNTER — Ambulatory Visit: Payer: Self-pay | Attending: Internal Medicine | Admitting: Internal Medicine

## 2019-08-11 ENCOUNTER — Other Ambulatory Visit: Payer: Self-pay

## 2019-08-11 ENCOUNTER — Encounter: Payer: Self-pay | Admitting: Internal Medicine

## 2019-08-11 VITALS — BP 119/78 | HR 68 | Temp 98.4°F | Resp 16 | Wt 301.8 lb

## 2019-08-11 DIAGNOSIS — Z6841 Body Mass Index (BMI) 40.0 and over, adult: Secondary | ICD-10-CM | POA: Insufficient documentation

## 2019-08-11 DIAGNOSIS — E119 Type 2 diabetes mellitus without complications: Secondary | ICD-10-CM

## 2019-08-11 DIAGNOSIS — E785 Hyperlipidemia, unspecified: Secondary | ICD-10-CM

## 2019-08-11 DIAGNOSIS — E1169 Type 2 diabetes mellitus with other specified complication: Secondary | ICD-10-CM

## 2019-08-11 DIAGNOSIS — Z7982 Long term (current) use of aspirin: Secondary | ICD-10-CM | POA: Insufficient documentation

## 2019-08-11 DIAGNOSIS — R079 Chest pain, unspecified: Secondary | ICD-10-CM

## 2019-08-11 DIAGNOSIS — Z79899 Other long term (current) drug therapy: Secondary | ICD-10-CM | POA: Insufficient documentation

## 2019-08-11 DIAGNOSIS — Z7984 Long term (current) use of oral hypoglycemic drugs: Secondary | ICD-10-CM | POA: Insufficient documentation

## 2019-08-11 DIAGNOSIS — R7303 Prediabetes: Secondary | ICD-10-CM

## 2019-08-11 DIAGNOSIS — I10 Essential (primary) hypertension: Secondary | ICD-10-CM

## 2019-08-11 LAB — POCT GLYCOSYLATED HEMOGLOBIN (HGB A1C): HbA1c, POC (controlled diabetic range): 6.5 % (ref 0.0–7.0)

## 2019-08-11 LAB — GLUCOSE, POCT (MANUAL RESULT ENTRY): POC Glucose: 130 mg/dl — AB (ref 70–99)

## 2019-08-11 NOTE — Progress Notes (Signed)
Patient ID: Scott Mcmillan, male    DOB: [birth year]  MRN: [id number]  CC: Hypertension and Diabetes (prediabetes )   Subjective: Scott Mcmillan is a 59 y.o. male who presents for chronic ds management His concerns today include:  Patient with history of HTN, HL, morbid obesity, pre-DM  HYPERTENSION Currently taking: see medication list Med Adherence: [x]  Yes    []  No Medication side effects: []  Yes    [x]  No Adherence with salt restriction: [x]  Yes    []  No Home Monitoring?: [x]  Yes    []  No Monitoring Frequency: QOD Home BP results range: readings have been similar to reading today SOB? []  Yes    [x]  No Chest Pain?: [x]  Yes - gets a "dull ache" LT side of chest close to breast. Started about 2 mths ago. Associated with certain movement of LT arm or trunk; last few seconds.  No radiation, no associated SOB/N/V.  No associated with exertion.  Has been mowing his lawn at least twice a week with a push mower and has not noticed any chest pains.  Has a slight of steps to his front porch.  No CP when climbing steps.  Had ST in 2008.  Told he probably had about a 5% blockage Leg swelling?: [x]  Yes  In LT which is chronic.    []  No Headaches?: []  Yes    [x]  No Dizziness? []  Yes    [x]  No Comments:   PreDM/Obesity:  Eating habits sometimes good and sometimes not Not getting in much exercise over past several mths.  Helping to care for mom.  HL:  Compliant with Pravachol; Patient Active Problem List   Diagnosis Date Noted  . Influenza vaccine needed 01/13/2019  . Non-recurrent bilateral inguinal hernia without obstruction or gangrene 04/23/2018  . Hemorrhoids 01/22/2018  . Prediabetes 01/22/2018  . Actinic keratosis due to exposure to sunlight 07/21/2017  . Lower extremity edema 07/21/2017  . Class 3 severe obesity due to excess calories without serious comorbidity with body mass index (BMI) of 50.0 to 59.9 in adult (HCC) 07/21/2017  . Hyperlipidemia with target low density lipoprotein  (LDL) cholesterol less than 100 mg/dL  . Hypertension 10/31/2016     Current Outpatient Medications on File Prior to Visit  Medication Sig Dispense Refill  . amLODipine (NORVASC) 10 MG tablet Take 1 tablet (10 mg total) by mouth daily. 30 tablet 6  . aspirin EC 81 MG tablet Take 1 tablet (81 mg total) by mouth daily. 90 tablet 3  . carvedilol (COREG) 12.5 MG tablet Take 1 tablet (12.5 mg total) by mouth 2 (two) times daily with a meal. 60 tablet 6  . hydrochlorothiazide (HYDRODIURIL) 25 MG tablet Take 1 tablet (25 mg total) by mouth daily. 30 tablet 6  . lisinopril (ZESTRIL) 40 MG tablet Take 1 tablet (40 mg total) by mouth daily. 30 tablet 6  . metFORMIN (GLUCOPHAGE) 500 MG tablet TAKE 1 TABLET (500 MG TOTAL) BY MOUTH DAILY WITH BREAKFAST. 30 tablet 0  . pravastatin (PRAVACHOL) 20 MG tablet Take 1 tablet (20 mg total) by mouth daily. 30 tablet 6   No current facility-administered medications on file prior to visit.    No Known Allergies  Social History   Socioeconomic History  . Marital status: Single    Spouse name: Not on file  . Number of children: Not on file  . Years of education: Not on file  . Highest education level: Not on file  Occupational History  .  Not on file  Tobacco Use  . Smoking status: Never Smoker  . Smokeless tobacco: Never Used  Substance and Sexual Activity  . Alcohol use: No  . Drug use: No  . Sexual activity: Not on file  Other Topics Concern  . Not on file  Social History Narrative  . Not on file   Social Determinants of Health   Financial Resource Strain:   . Difficulty of Paying Living Expenses:   Food Insecurity:   . Worried About Programme researcher, broadcasting/film/video in the Last Year:   . Barista in the Last Year:   Transportation Needs:   . Freight forwarder (Medical):   Marland Kitchen Lack of Transportation (Non-Medical):   Physical Activity:   . Days of Exercise per Week:   . Minutes of Exercise per Session:   Stress:   . Feeling of  Stress :   Social Connections:   . Frequency of Communication with Friends and Family:   . Frequency of Social Gatherings with Friends and Family:   . Attends Religious Services:   . Active Member of Clubs or Organizations:   . Attends Banker Meetings:   Marland Kitchen Marital Status:   Intimate Partner Violence:   . Fear of Current or Ex-Partner:   . Emotionally Abused:   Marland Kitchen Physically Abused:   . Sexually Abused:     Family History  Problem Relation Age of Onset  . Colon cancer Neg Hx   . Esophageal cancer Neg Hx   . Liver cancer Neg Hx   . Pancreatic cancer Neg Hx   . Rectal cancer Neg Hx   . Stomach cancer Neg Hx     No past surgical history on file.  ROS: Review of Systems Negative except as stated above  PHYSICAL EXAM: BP 119/78   Pulse 68   Temp 98.4 F (36.9 C)   Resp 16   Wt (!) 301 lb 12.8 oz (136.9 kg)   SpO2 95%   BMI 47.27 kg/m   Wt Readings from Last 3 Encounters:  08/11/19 (!) 301 lb 12.8 oz (136.9 kg)  01/13/19 (!) 303 lb 3.2 oz (137.5 kg)  04/23/18 290 lb (131.5 kg)    Physical Exam General appearance - alert, well appearing, older obese Caucasian male and in no distress Mental status - normal mood, behavior, speech, dress, motor activity, and thought processes Neck - supple, no significant adenopathy Chest - clear to auscultation, no wheezes, rales or rhonchi, symmetric air entry Heart - normal rate, regular rhythm, normal S1, S2, no murmurs, rubs, clicks or gallops Extremities -trace edema left greater than right lower extremity  Results for orders placed or performed in visit on 08/11/19  POCT glucose (manual entry)  Result Value Ref Range   POC Glucose 130 (A) 70 - 99 mg/dl  POCT glycosylated hemoglobin (Hb A1C)  Result Value Ref Range   Hemoglobin A1C     HbA1c POC (<> result, manual entry)     HbA1c, POC (prediabetic range)     HbA1c, POC (controlled diabetic range) 6.5 0.0 - 7.0 %    CMP Latest Ref Rng & Units 10/12/2018  10/22/2017 10/31/2016  Glucose 65 - 99 mg/dL 494(W) 967(R) 96  BUN 8 - 27 mg/dL 16 12 14   Creatinine 0.76 - 1.27 mg/dL 9.16 3.84  Sodium 134 - 144 mmol/L 140 139 138  Potassium 3.5 - 5.2 mmol/L 3.7 3.8 3.7  Chloride 96 - 106 mmol/L 97 99 96  CO2 20 - 29 mmol/L 24 24 25   Calcium 8.6 - 10.2 mg/dL 9.7 9.9 9.9  Total Protein 6.0 - 8.5 g/dL 6.8 7.4 7.9  Total Bilirubin 0.0 - 1.2 mg/dL 0.5 0.6 0.6  Alkaline Phos 39 - 117 IU/L 39 46 60  AST 0 - 40 IU/L 16 20 21   ALT 0 - 44 IU/L 16 19 20    Lipid Panel     Component Value Date/Time   CHOL 134 10/12/2018 0852   TRIG 87 10/12/2018 0852   HDL 31 (L) 10/12/2018 0852   CHOLHDL 4.3 10/12/2018 0852   LDLCALC 86 10/12/2018 0852    CBC    Component Value Date/Time   WBC 9.4 10/12/2018 0852   WBC 12.1 (H) 09/23/2016 0953   RBC 4.63 10/12/2018 0852   RBC 4.66 09/23/2016 0953   HGB 14.2 10/12/2018 0852   HCT 42.2 10/12/2018 0852   PLT 231 10/12/2018 0852   MCV 91 10/12/2018 0852   MCH 30.7 10/12/2018 0852   MCH 29.2 09/23/2016 0953   MCHC 33.6 10/12/2018 0852   MCHC 32.1 09/23/2016 0953   RDW 12.5 10/12/2018 0852    ASSESSMENT AND PLAN: 1. Type 2 diabetes mellitus without complication, without long-term current use of insulin (HCC) Based on his A1c today he is now in the range for diabetes.  Discussed implications of this and complications that can occur from having diabetes.  Dietary counseling given.  Encouraged him to be more active. Advised of the importance of getting annual eye exams done. - POCT glucose (manual entry) - POCT glycosylated hemoglobin (Hb A1C)  2. Essential hypertension At goal.  Continue current medications including amlodipine, carvedilol, hydrochlorothiazide and lisinopril.  DASH diet discussed and encouraged.  3. Chest pain in adult Patient has risk factors for heart disease with chest pains atypical.  EKG is normal without ischemic changes.  Advised patient that if he starts noticing pain with activity he  needs to make an urgent care visit for evaluation.  In the meantime he will continue taking a daily aspirin.  He is also on a statin. - EKG 12-Lead  4. Morbid obesity ([place]) See #1 above  5. Hyperlipidemia associated with type 2 diabetes mellitus (HCC) Continue Pravachol.     Patient was given the opportunity to ask questions.  Patient verbalized understanding of the plan and was able to repeat key elements of the plan.   Orders Placed This Encounter  Procedures  . POCT glucose (manual entry)  . POCT glycosylated hemoglobin (Hb A1C)     Requested Prescriptions    No prescriptions requested or ordered in this encounter    No follow-ups on file.  Karle Plumber, MD, FACP

## 2019-08-11 NOTE — Patient Instructions (Signed)
Continue taking your current medications. Please pay attention to when you have chest pains.  If they start occurring with activity you need to follow-up with me right away.   Diabetes Mellitus and Nutrition, Adult When you have diabetes (diabetes mellitus), it is very important to have healthy eating habits because your blood sugar (glucose) levels are greatly affected by what you eat and drink. Eating healthy foods in the appropriate amounts, at about the same times every day, can help you:  Control your blood glucose.  Lower your risk of heart disease.  Improve your blood pressure.  Reach or maintain a healthy weight. Every person with diabetes is different, and each person has different needs for a meal plan. Your health care provider may recommend that you work with a diet and nutrition specialist (dietitian) to make a meal plan that is best for you. Your meal plan may vary depending on factors such as:  The calories you need.  The medicines you take.  Your weight.  Your blood glucose, blood pressure, and cholesterol levels.  Your activity level.  Other health conditions you have, such as heart or kidney disease. How do carbohydrates affect me? Carbohydrates, also called carbs, affect your blood glucose level more than any other type of food. Eating carbs naturally raises the amount of glucose in your blood. Carb counting is a method for keeping track of how many carbs you eat. Counting carbs is important to keep your blood glucose at a healthy level, especially if you use insulin or take certain oral diabetes medicines. It is important to know how many carbs you can safely have in each meal. This is different for every person. Your dietitian can help you calculate how many carbs you should have at each meal and for each snack. Foods that contain carbs include:  Bread, cereal, rice, pasta, and crackers.  Potatoes and corn.  Peas, beans, and lentils.  Milk and  yogurt.  Fruit and juice.  Desserts, such as cakes, cookies, ice cream, and candy. How does alcohol affect me? Alcohol can cause a sudden decrease in blood glucose (hypoglycemia), especially if you use insulin or take certain oral diabetes medicines. Hypoglycemia can be a life-threatening condition. Symptoms of hypoglycemia (sleepiness, dizziness, and confusion) are similar to symptoms of having too much alcohol. If your health care provider says that alcohol is safe for you, follow these guidelines:  Limit alcohol intake to no more than 1 drink per day for nonpregnant women and 2 drinks per day for men. One drink equals 12 oz of beer, 5 oz of wine, or 1 oz of hard liquor.  Do not drink on an empty stomach.  Keep yourself hydrated with water, diet soda, or unsweetened iced tea.  Keep in mind that regular soda, juice, and other mixers may contain a lot of sugar and must be counted as carbs. What are tips for following this plan?  Reading food labels  Start by checking the serving size on the "Nutrition Facts" label of packaged foods and drinks. The amount of calories, carbs, fats, and other nutrients listed on the label is based on one serving of the item. Many items contain more than one serving per package.  Check the total grams (g) of carbs in one serving. You can calculate the number of servings of carbs in one serving by dividing the total carbs by 15. For example, if a food has 30 g of total carbs, it would be equal to 2 servings of carbs.  Check the number of grams (g) of saturated and trans fats in one serving. Choose foods that have low or no amount of these fats.  Check the number of milligrams (mg) of salt (sodium) in one serving. Most people should limit total sodium intake to less than 2,300 mg per day.  Always check the nutrition information of foods labeled as "low-fat" or "nonfat". These foods may be higher in added sugar or refined carbs and should be avoided.  Talk to  your dietitian to identify your daily goals for nutrients listed on the label. Shopping  Avoid buying canned, premade, or processed foods. These foods tend to be high in fat, sodium, and added sugar.  Shop around the outside edge of the grocery store. This includes fresh fruits and vegetables, bulk grains, fresh meats, and fresh dairy. Cooking  Use low-heat cooking methods, such as baking, instead of high-heat cooking methods like deep frying.  Cook using healthy oils, such as olive, canola, or sunflower oil.  Avoid cooking with butter, cream, or high-fat meats. Meal planning  Eat meals and snacks regularly, preferably at the same times every day. Avoid going long periods of time without eating.  Eat foods high in fiber, such as fresh fruits, vegetables, beans, and whole grains. Talk to your dietitian about how many servings of carbs you can eat at each meal.  Eat 4-6 ounces (oz) of lean protein each day, such as lean meat, chicken, fish, eggs, or tofu. One oz of lean protein is equal to: ? 1 oz of meat, chicken, or fish. ? 1 egg. ?  cup of tofu.  Eat some foods each day that contain healthy fats, such as avocado, nuts, seeds, and fish. Lifestyle  Check your blood glucose regularly.  Exercise regularly as told by your health care provider. This may include: ? 150 minutes of moderate-intensity or vigorous-intensity exercise each week. This could be brisk walking, biking, or water aerobics. ? Stretching and doing strength exercises, such as yoga or weightlifting, at least 2 times a week.  Take medicines as told by your health care provider.  Do not use any products that contain nicotine or tobacco, such as cigarettes and e-cigarettes. If you need help quitting, ask your health care provider.  Work with a Social worker or diabetes educator to identify strategies to manage stress and any emotional and social challenges. Questions to ask a health care provider  Do I need to meet with  a diabetes educator?  Do I need to meet with a dietitian?  What number can I call if I have questions?  When are the best times to check my blood glucose? Where to find more information:  American Diabetes Association: diabetes.org  Academy of Nutrition and Dietetics: www.eatright.[employer] of Diabetes and Digestive and Kidney Diseases (NIH): [employer] Summary  A healthy meal plan will help you control your blood glucose and maintain a healthy lifestyle.  Working with a diet and nutrition specialist (dietitian) can help you make a meal plan that is best for you.  Keep in mind that carbohydrates (carbs) and alcohol have immediate effects on your blood glucose levels. It is important to count carbs and to use alcohol carefully. This information is not intended to replace advice given to you by your health care provider. Make sure you discuss any questions you have with your health care provider. Document Revised: 04/03/2017 Document Reviewed: 05/26/2016 Elsevier Patient Education  2020 [employer].

## 2019-08-22 ENCOUNTER — Ambulatory Visit: Payer: Self-pay | Attending: Internal Medicine

## 2019-08-22 DIAGNOSIS — Z23 Encounter for immunization: Secondary | ICD-10-CM

## 2019-08-22 NOTE — Progress Notes (Signed)
   Covid-19 Vaccination Clinic  Name:  Kameren Pargas    MRN: [id number] DOB: [birth year]  08/22/2019  Mr. Heigl was observed post Covid-19 immunization for 15 minutes without incident. He was provided with Vaccine Information Sheet and instruction to access the V-Safe system.   Mr. Beevers was instructed to call 911 with any severe reactions post vaccine: Marland Kitchen Difficulty breathing  . Swelling of face and throat  . A fast heartbeat  . A bad rash all over body  . Dizziness and weakness   Immunizations Administered    Name Date Dose VIS Date Route   Pfizer COVID-19 Vaccine 08/22/2019  9:55 AM 0.3 mL 06/29/2018 Intramuscular   Manufacturer: [employer], [employer]   Lot: [id number]   NDC: [address]-9

## 2019-08-24 ENCOUNTER — Other Ambulatory Visit: Payer: Self-pay | Admitting: Internal Medicine

## 2019-08-24 DIAGNOSIS — I10 Essential (primary) hypertension: Secondary | ICD-10-CM

## 2019-08-24 DIAGNOSIS — R7303 Prediabetes: Secondary | ICD-10-CM

## 2019-08-24 MED FILL — ?CARVEDILOL 12.5 MG TABLET: 12.5 | 30 days supply | Qty: 60 | Fill #6

## 2019-08-24 MED FILL — AMLODIPINE BESYLATE 10 MG T: 10 | 30 days supply | Qty: 30 | Fill #4

## 2019-08-24 MED FILL — ?PRAVASTATIN NA 20MG TABL: 20 | 30 days supply | Qty: 30 | Fill #6

## 2019-08-24 MED FILL — HYDROCHLOROTHIAZIDE 25 MG T: 25 | 30 days supply | Qty: 30 | Fill #6

## 2019-08-25 MED FILL — METFORMIN HCL 500 MG TABS: 500 | 30 days supply | Qty: 30 | Fill #0

## 2019-08-25 MED FILL — LISINOPRIL 40 MG TABLET: 40 | 30 days supply | Qty: 30 | Fill #0

## 2019-09-28 ENCOUNTER — Telehealth: Payer: Self-pay | Admitting: Internal Medicine

## 2019-09-28 DIAGNOSIS — R7303 Prediabetes: Secondary | ICD-10-CM

## 2019-09-28 MED ORDER — METFORMIN HCL 500 MG PO TABS: 500.0000 mg | ORAL_TABLET | Freq: Every day | ORAL | 2 refills | Status: DC

## 2019-09-28 MED FILL — METFORMIN HCL 500 MG TABS: 500 | 30 days supply | Qty: 30 | Fill #0

## 2019-09-28 NOTE — Telephone Encounter (Signed)
Patient called in and requested for listed medication to be refilled and sent to [facility].  metFORMIN (GLUCOPHAGE) 500 MG tablet [[id number]]

## 2019-09-28 NOTE — Telephone Encounter (Signed)
Rx sent 

## 2019-10-18 MED FILL — ?AMLODIPINE BESYL 10MG TABL: 10 | 30 days supply | Qty: 30 | Fill #5

## 2019-10-18 MED FILL — LISINOPRIL 40 MG TABLET: 40 | 30 days supply | Qty: 30 | Fill #1

## 2019-10-21 ENCOUNTER — Telehealth: Payer: Self-pay

## 2019-10-21 DIAGNOSIS — E785 Hyperlipidemia, unspecified: Secondary | ICD-10-CM

## 2019-10-21 DIAGNOSIS — I10 Essential (primary) hypertension: Secondary | ICD-10-CM

## 2019-10-21 MED ORDER — CARVEDILOL 12.5 MG PO TABS: 12.5000 mg | ORAL_TABLET | Freq: Two times a day (BID) | ORAL | 2 refills | Status: DC

## 2019-10-21 MED ORDER — AMLODIPINE BESYLATE 10 MG PO TABS: 10.0000 mg | ORAL_TABLET | Freq: Every day | ORAL | 2 refills | Status: DC

## 2019-10-21 MED ORDER — PRAVASTATIN SODIUM 20 MG PO TABS: 20.0000 mg | ORAL_TABLET | Freq: Every day | ORAL | 2 refills | Status: DC

## 2019-10-21 MED ORDER — HYDROCHLOROTHIAZIDE 25 MG PO TABS: 25.0000 mg | ORAL_TABLET | Freq: Every day | ORAL | 2 refills | Status: DC

## 2019-10-21 MED FILL — ?CARVEDILOL 12.5 MG TABLET: 12.5 | 30 days supply | Qty: 60 | Fill #0

## 2019-10-21 MED FILL — ?PRAVASTATIN NA 20MG TABL: 20 | 30 days supply | Qty: 30 | Fill #0

## 2019-10-21 MED FILL — HYDROCHLOROTHIAZIDE 25 MG T: 25 | 30 days supply | Qty: 30 | Fill #0

## 2019-10-21 NOTE — Telephone Encounter (Signed)
Patient is needing refills on listed medications sent to [facility]   Pravastatin,carvedilol,HCTZ,amlodipine

## 2019-10-21 NOTE — Telephone Encounter (Signed)
Rx sent 

## 2019-11-01 MED FILL — METFORMIN HCL 500 MG TABS: 500 | 30 days supply | Qty: 30 | Fill #1

## 2019-11-08 ENCOUNTER — Ambulatory Visit: Payer: Self-pay | Attending: Internal Medicine | Admitting: Internal Medicine

## 2019-11-08 ENCOUNTER — Other Ambulatory Visit: Payer: Self-pay

## 2019-11-08 ENCOUNTER — Encounter: Payer: Self-pay | Admitting: Internal Medicine

## 2019-11-08 DIAGNOSIS — E1169 Type 2 diabetes mellitus with other specified complication: Secondary | ICD-10-CM | POA: Insufficient documentation

## 2019-11-08 DIAGNOSIS — E785 Hyperlipidemia, unspecified: Secondary | ICD-10-CM

## 2019-11-08 DIAGNOSIS — I10 Essential (primary) hypertension: Secondary | ICD-10-CM

## 2019-11-08 MED ORDER — TRUE METRIX BLOOD GLUCOSE TEST VI STRP: ORAL_STRIP | 12 refills | Status: DC

## 2019-11-08 MED ORDER — TRUEPLUS LANCETS 28G MISC: 4 refills | Status: DC

## 2019-11-08 MED ORDER — TRUE METRIX METER W/DEVICE KIT: PACK | 0 refills | Status: DC

## 2019-11-08 MED ORDER — AMLODIPINE BESYLATE 10 MG PO TABS: 10.0000 mg | ORAL_TABLET | Freq: Every day | ORAL | 6 refills | Status: DC

## 2019-11-08 MED ORDER — METFORMIN HCL 500 MG PO TABS: 500.0000 mg | ORAL_TABLET | Freq: Every day | ORAL | 6 refills | Status: DC

## 2019-11-08 MED ORDER — HYDROCHLOROTHIAZIDE 25 MG PO TABS: 25.0000 mg | ORAL_TABLET | Freq: Every day | ORAL | 6 refills | Status: DC

## 2019-11-08 MED ORDER — CARVEDILOL 12.5 MG PO TABS: 12.5000 mg | ORAL_TABLET | Freq: Two times a day (BID) | ORAL | 6 refills | Status: DC

## 2019-11-08 MED FILL — TRUE METRIX TEST STRIP: 25 days supply | Qty: 100 | Fill #0

## 2019-11-08 MED FILL — !TRUE METRIX BLOOD GLUCOSE: 1 days supply | Qty: 1 | Fill #0

## 2019-11-08 MED FILL — TRUEplus LANCETS 28G MISC: 25 days supply | Qty: 100 | Fill #0

## 2019-11-08 NOTE — Progress Notes (Signed)
Virtual Visit via Telephone Note Due to current restrictions/limitations of in-office visits due to the COVID-19 pandemic, this scheduled clinical appointment was converted to a telehealth visit  I connected with Scott Mcmillan on 11/08/19 at 8:35 a.m by telephone and verified that I am speaking with the correct person using two identifiers. I am in my office.  The patient is at home.  Only the patient and myself participated in this encounter.  I discussed the limitations, risks, security and privacy concerns of performing an evaluation and management service by telephone and the availability of in person appointments. I also discussed with the patient that there may be a patient responsible charge related to this service. The patient expressed understanding and agreed to proceed.   History of Present Illness: Patient with history of HTN, HL,morbidobesity, DM.  Last seen 08/2019. Today's visit is for chronic ds management.  DIABETES TYPE 2 Last A1C:   Results for orders placed or performed in visit on 08/11/19  POCT glucose (manual entry)  Result Value Ref Range   POC Glucose 130 (A) 70 - 99 mg/dl  POCT glycosylated hemoglobin (Hb A1C)  Result Value Ref Range   Hemoglobin A1C     HbA1c POC (<> result, manual entry)     HbA1c, POC (prediabetic range)     HbA1c, POC (controlled diabetic range) 6.5 0.0 - 7.0 %    Med Adherence:  _0  Yes    _1  No Medication side effects:  _2  Yes    _3  No Home Monitoring?  _4  Yes    _5  No Home glucose results range: Diet Adherence: "I probably need to eat less.  I over ate on July 4th; had too many burgers." Trying to stay off sugar drinks and snacks. wgh 295 lbs today, down 6 lbs from 3 mths ago Exercise: walks around his block and sometimes at [place] most days but not lately due to the weather. Hypoglycemic episodes?: _6  Yes    _7  No Numbness of the feet? _8  Yes    _9  No Retinopathy hx? _10  Yes    _11  No Last eye exam:  Had eye exam 3 wks  ago Dr. Benson Setting at [facility] on [address] Comments:   HYPERTENSION Currently taking: see medication list.  He is on lisinopril, hydrochlorothiazide, carvedilol and amlodipine Med Adherence: _12  Yes    _13  No Medication side effects: _14  Yes    _15  No Adherence with salt restriction: _16  Yes    _17  No Home Monitoring?: _18  Yes    _19  No Monitoring Frequency: _20  Yes    _21  No Home BP results range: 120-130/80-90.  BP this a.m was 121/80, pulse 20 SOB? _22  Yes    _23  No Chest Pain?: _24  Yes    _25  No Leg swelling?: _26  Yes -LT lower leg which is chronic     Headaches?: _27  Yes    _28  No Dizziness? _29  Yes    _30  No Comments:   HL:  Taking and tolerating Pravachol  Outpatient Encounter Medications as of 11/08/2019  Medication Sig  . amLODipine (NORVASC) 10 MG tablet Take 1 tablet (10 mg total) by mouth daily.  Marland Kitchen aspirin EC 81 MG tablet Take 1 tablet (81 mg total) by mouth daily.  . carvedilol (COREG) 12.5 MG tablet Take 1 tablet (12.5 mg total) by mouth 2 (two) times daily with a meal.  . hydrochlorothiazide (HYDRODIURIL) 25 MG tablet Take 1 tablet (25 mg total) by mouth daily.  Marland Kitchen lisinopril (ZESTRIL) 40 MG tablet  TAKE 1 TABLET (40 MG TOTAL) BY MOUTH DAILY.  . metFORMIN (GLUCOPHAGE) 500 MG tablet Take 1 tablet (500 mg total) by mouth daily with breakfast.  . pravastatin (PRAVACHOL) 20 MG tablet Take 1 tablet (20 mg total) by mouth daily.   No facility-administered encounter medications on file as of 11/08/2019.      Observations/Objective: Blood pressure 121/80, pulse 62, weight 295 lb (133.8 kg).    Assessment and Plan: 1. Type 2 diabetes mellitus with morbid obesity ([name]) Continue Metformin Commended him on some of the dietary changes that he is trying to make.  Encouraged him to do more.  Encouraged him to try to get in at least 30 minutes of moderate intensity exercise 3 to 4 days a week. - glucose blood (TRUE METRIX BLOOD GLUCOSE TEST) test strip; Use as instructed  Dispense: 100  each; Refill: 12 - Blood Glucose Monitoring Suppl (TRUE METRIX METER) w/Device KIT; Use as directed  Dispense: 1 kit; Refill: 0 - TRUEplus Lancets 28G MISC; Use as directed  Dispense: 100 each; Refill: 4 - CBC; Future - Comprehensive metabolic panel; Future - Lipid panel; Future - Hemoglobin A1c; Future - Microalbumin / creatinine urine ratio; Future - metFORMIN (GLUCOPHAGE) 500 MG tablet; Take 1 tablet (500 mg total) by mouth daily with breakfast.  Dispense: 30 tablet; Refill: 6  2. Essential hypertension At goal.  Continue current medications and low-salt diet - carvedilol (COREG) 12.5 MG tablet; Take 1 tablet (12.5 mg total) by mouth 2 (two) times daily with a meal.  Dispense: 60 tablet; Refill: 6 - hydrochlorothiazide (HYDRODIURIL) 25 MG tablet; Take 1 tablet (25 mg total) by mouth daily.  Dispense: 30 tablet; Refill: 6 - amLODipine (NORVASC) 10 MG tablet; Take 1 tablet (10 mg total) by mouth daily.  Dispense: 30 tablet; Refill: 6  3. Hyperlipidemia associated with type 2 diabetes mellitus (H[facility]) Continue Pravachol - Lipid panel; Future   Follow Up Instructions: 4 mths in person   I discussed the assessment and treatment plan with the patient. The patient was provided an opportunity to ask questions and all were answered. The patient agreed with the plan and demonstrated an understanding of the instructions.   The patient was advised to call back or seek an in-person evaluation if the symptoms worsen or if the condition fails to improve as anticipated.  I provided 13 minutes of non-face-to-face time during this encounter.   Karle Plumber, MD

## 2019-11-11 NOTE — Addendum Note (Signed)
Addended byMemory Dance on: 11/11/2019 03:01 PM   Modules accepted: Orders

## 2019-11-12 LAB — CBC
Hematocrit: 46.5 % (ref 37.5–51.0)
Hemoglobin: 15.4 g/dL (ref 13.0–17.7)
MCH: 30.4 pg (ref 26.6–33.0)
MCHC: 33.1 g/dL (ref 31.5–35.7)
MCV: 92 fL (ref 79–97)
Platelets: 209 10*3/uL (ref 150–450)
RBC: 5.07 x10E6/uL (ref 4.14–5.80)
RDW: 12.6 % (ref 11.6–15.4)
WBC: 9.4 10*3/uL (ref 3.4–10.8)

## 2019-11-12 LAB — LIPID PANEL
Chol/HDL Ratio: 4.8 ratio (ref 0.0–5.0)
Cholesterol, Total: 153 mg/dL (ref 100–199)
HDL: 32 mg/dL — ABNORMAL LOW (ref >39)
LDL Chol Calc (NIH): 92 mg/dL (ref 0–99)
Triglycerides: 166 mg/dL — ABNORMAL HIGH (ref 0–149)
VLDL Cholesterol Cal: 29 mg/dL (ref 5–40)

## 2019-11-12 LAB — MICROALBUMIN / CREATININE URINE RATIO
Creatinine, Urine: 342.7 mg/dL
Microalb/Creat Ratio: 25 mg/g creat (ref 0–29)
Microalbumin, Urine: 85.4 ug/mL

## 2019-11-12 LAB — HEMOGLOBIN A1C
Est. average glucose Bld gHb Est-mCnc: 143 mg/dL
Hgb A1c MFr Bld: 6.6 % — ABNORMAL HIGH (ref 4.8–5.6)

## 2019-11-12 LAB — COMPREHENSIVE METABOLIC PANEL
ALT: 25 IU/L (ref 0–44)
AST: 20 IU/L (ref 0–40)
Albumin/Globulin Ratio: 1.3 (ref 1.2–2.2)
Albumin: 4.1 g/dL (ref 3.8–4.8)
Alkaline Phosphatase: 46 IU/L — ABNORMAL LOW (ref 48–121)
BUN/Creatinine Ratio: 14 (ref 10–24)
BUN: 14 mg/dL (ref 8–27)
Bilirubin Total: 0.4 mg/dL (ref 0.0–1.2)
CO2: 25 mmol/L (ref 20–29)
Calcium: 9.5 mg/dL (ref 8.6–10.2)
Chloride: 101 mmol/L (ref 96–106)
Creatinine, Ser: 0.98 mg/dL (ref 0.76–1.27)
GFR calc Af Amer: 96 mL/min/{1.73_m2} (ref >59)
GFR calc non Af Amer: 83 mL/min/{1.73_m2} (ref >59)
Globulin, Total: 3.2 g/dL (ref 1.5–4.5)
Glucose: 96 mg/dL (ref 65–99)
Potassium: 3.9 mmol/L (ref 3.5–5.2)
Sodium: 140 mmol/L (ref 134–144)
Total Protein: 7.3 g/dL (ref 6.0–8.5)

## 2019-11-13 ENCOUNTER — Other Ambulatory Visit: Payer: Self-pay | Admitting: Internal Medicine

## 2019-11-13 DIAGNOSIS — E785 Hyperlipidemia, unspecified: Secondary | ICD-10-CM

## 2019-11-13 MED ORDER — PRAVASTATIN SODIUM 40 MG PO TABS: 20.0000 mg | ORAL_TABLET | Freq: Every day | ORAL | 6 refills | Status: DC

## 2019-11-13 NOTE — Progress Notes (Signed)
No significant protein in the urine.  This is good.  A1c is 6.6 with goal being less than 7.  This means his diabetes is well controlled.  LDL cholesterol is 92 with goal being less than 70.  I recommend increasing the Pravachol to 40 mg daily.  An updated prescription will be sent to the pharmacy.  Kidney and liver function tests are normal.  Blood count is normal meaning no anemia.

## 2019-11-14 MED FILL — ?PRAVASTATIN NA 40MG TABL: 40 | 30 days supply | Qty: 15 | Fill #0

## 2019-11-18 ENCOUNTER — Other Ambulatory Visit: Payer: Self-pay | Admitting: Internal Medicine

## 2019-11-18 DIAGNOSIS — I10 Essential (primary) hypertension: Secondary | ICD-10-CM

## 2019-11-18 MED ORDER — LISINOPRIL 40 MG PO TABS: 40.0000 mg | ORAL_TABLET | Freq: Every day | ORAL | 1 refills | Status: DC

## 2019-11-18 MED ORDER — AMLODIPINE BESYLATE 10 MG PO TABS: 10.0000 mg | ORAL_TABLET | Freq: Every day | ORAL | 1 refills | Status: DC

## 2019-11-18 MED ORDER — HYDROCHLOROTHIAZIDE 25 MG PO TABS: 25.0000 mg | ORAL_TABLET | Freq: Every day | ORAL | 1 refills | Status: DC

## 2019-11-18 NOTE — Telephone Encounter (Signed)
Medication Refill - Medication: hydrochlorothiazide (HYDRODIURIL) 25 MG tablet ,amLODipine (NORVASC) 10 MG tablet,lisinopril (ZESTRIL) 40 MG tablet  Has the patient contacted their pharmacy? yes (Agent: If no, request that the patient contact the pharmacy for the refill.) (Agent: If yes, when and what did the pharmacy advise?)Contact PCP  Preferred Pharmacy (with phone number or street name):  [facility] - [place], [place] - [address] Phone:  [phone]  Fax:  [phone]       Agent: Please be advised that RX refills may take up to 3 business days. We ask that you follow-up with your pharmacy.

## 2019-11-21 MED FILL — ?AMLODIPINE BESYL 10MG TABL: 10 | 30 days supply | Qty: 30 | Fill #0

## 2019-11-21 MED FILL — HYDROCHLOROTHIAZIDE 25 MG T: 25 | 30 days supply | Qty: 30 | Fill #0

## 2019-11-21 MED FILL — LISINOPRIL 40 MG TABLET: 40 | 30 days supply | Qty: 30 | Fill #0

## 2019-12-07 MED FILL — ?METFORMIN HCL 500MG TABL: 500 | 30 days supply | Qty: 30 | Fill #2

## 2019-12-07 MED FILL — PRAVASTATIN NA 40 MG TAB: 40 | 30 days supply | Qty: 15 | Fill #1

## 2019-12-20 ENCOUNTER — Ambulatory Visit: Payer: Self-pay | Attending: Family Medicine

## 2019-12-20 ENCOUNTER — Other Ambulatory Visit: Payer: Self-pay

## 2020-01-05 MED FILL — PRAVASTATIN NA 40 MG TAB: 40 | 30 days supply | Qty: 15 | Fill #2

## 2020-01-05 MED FILL — ?CARVEDILOL 12.5 MG TABLET: 12.5 | 30 days supply | Qty: 60 | Fill #0

## 2020-01-05 MED FILL — ?METFORMIN HCL 500MG TABL: 500 | 30 days supply | Qty: 30 | Fill #0

## 2020-01-05 MED FILL — HYDROCHLOROTHIAZIDE 25 MG T: 25 | 30 days supply | Qty: 30 | Fill #1

## 2020-01-05 MED FILL — LISINOPRIL 40 MG TABLET: 40 | 30 days supply | Qty: 30 | Fill #1

## 2020-01-05 MED FILL — AMLODIPINE BESYLATE 10 MG T: 10 | 30 days supply | Qty: 30 | Fill #1

## 2020-01-25 ENCOUNTER — Other Ambulatory Visit: Payer: Self-pay | Admitting: Pharmacist

## 2020-01-25 DIAGNOSIS — E785 Hyperlipidemia, unspecified: Secondary | ICD-10-CM

## 2020-01-25 MED ORDER — PRAVASTATIN SODIUM 40 MG PO TABS: 40.0000 mg | ORAL_TABLET | Freq: Every day | ORAL | 6 refills | Status: DC

## 2020-02-08 MED FILL — CARVEDILOL 12.5 MG TABLET: 12.5 | 30 days supply | Qty: 60 | Fill #1

## 2020-02-08 MED FILL — HYDROCHLOROTHIAZIDE 25 MG T: 25 | 30 days supply | Qty: 30 | Fill #2

## 2020-02-08 MED FILL — LISINOPRIL 40 MG TABLET: 40 | 30 days supply | Qty: 30 | Fill #2

## 2020-02-08 MED FILL — ?METFORMIN HCL 500MG TABL: 500 | 30 days supply | Qty: 30 | Fill #1

## 2020-02-08 MED FILL — AMLODIPINE BESYLATE 10 MG T: 10 | 30 days supply | Qty: 30 | Fill #2

## 2020-02-08 MED FILL — PRAVASTATIN NA 40 MG TAB: 40 | 30 days supply | Qty: 30 | Fill #0

## 2020-02-27 ENCOUNTER — Telehealth: Payer: Self-pay

## 2020-02-27 NOTE — Telephone Encounter (Signed)
Copied from CRM [id number]. Topic: General - Other >> Feb 20, 2020  8:34 AM Tamela Oddi wrote: Reason for CRM: Patient called to make an appt. To renew his orange card.  Please call patient at [phone]

## 2020-02-28 ENCOUNTER — Telehealth: Payer: Self-pay | Admitting: Internal Medicine

## 2020-02-28 NOTE — Telephone Encounter (Signed)
I call back the Pt, he already has an appt for 03/01/20

## 2020-03-01 ENCOUNTER — Ambulatory Visit: Payer: Self-pay | Attending: Internal Medicine

## 2020-03-01 ENCOUNTER — Other Ambulatory Visit: Payer: Self-pay

## 2020-03-03 ENCOUNTER — Ambulatory Visit: Payer: Self-pay | Attending: Internal Medicine

## 2020-03-03 DIAGNOSIS — Z23 Encounter for immunization: Secondary | ICD-10-CM

## 2020-03-03 NOTE — Progress Notes (Signed)
   Covid-19 Vaccination Clinic  Name:  Scott Mcmillan    MRN: [id number] DOB: 12-10-1960  03/03/2020  [name] was observed post Covid-19 immunization for 15 minutes without incident. He was provided with Vaccine Information Sheet and instruction to access the V-Safe system.   [name] was instructed to call 911 with any severe reactions post vaccine: Marland Kitchen Difficulty breathing  . Swelling of face and throat  . A fast heartbeat  . A bad rash all over body  . Dizziness and weakness

## 2020-03-12 ENCOUNTER — Other Ambulatory Visit: Payer: Self-pay | Admitting: Internal Medicine

## 2020-03-12 ENCOUNTER — Other Ambulatory Visit: Payer: Self-pay

## 2020-03-12 ENCOUNTER — Ambulatory Visit: Payer: Self-pay | Attending: Internal Medicine | Admitting: Internal Medicine

## 2020-03-12 ENCOUNTER — Encounter: Payer: Self-pay | Admitting: Internal Medicine

## 2020-03-12 DIAGNOSIS — E785 Hyperlipidemia, unspecified: Secondary | ICD-10-CM

## 2020-03-12 DIAGNOSIS — Z23 Encounter for immunization: Secondary | ICD-10-CM

## 2020-03-12 DIAGNOSIS — L853 Xerosis cutis: Secondary | ICD-10-CM

## 2020-03-12 DIAGNOSIS — I10 Essential (primary) hypertension: Secondary | ICD-10-CM

## 2020-03-12 DIAGNOSIS — E1169 Type 2 diabetes mellitus with other specified complication: Secondary | ICD-10-CM

## 2020-03-12 LAB — POCT GLYCOSYLATED HEMOGLOBIN (HGB A1C): Hemoglobin A1C: 7.7 % — AB (ref 4.0–5.6)

## 2020-03-12 LAB — GLUCOSE, POCT (MANUAL RESULT ENTRY): POC Glucose: 168 mg/dl — AB (ref 70–99)

## 2020-03-12 MED ORDER — TRUEPLUS LANCETS 28G MISC: 4 refills | Status: DC

## 2020-03-12 MED ORDER — LISINOPRIL 40 MG PO TABS: 40.0000 mg | ORAL_TABLET | Freq: Every day | ORAL | 6 refills | Status: DC

## 2020-03-12 MED ORDER — HYDROCHLOROTHIAZIDE 25 MG PO TABS: 25.0000 mg | ORAL_TABLET | Freq: Every day | ORAL | 6 refills | Status: DC

## 2020-03-12 MED ORDER — AMLODIPINE BESYLATE 10 MG PO TABS: 10.0000 mg | ORAL_TABLET | Freq: Every day | ORAL | 6 refills | Status: DC

## 2020-03-12 MED ORDER — METFORMIN HCL 500 MG PO TABS: 500.0000 mg | ORAL_TABLET | Freq: Two times a day (BID) | ORAL | 6 refills | Status: DC

## 2020-03-12 MED ORDER — TRUE METRIX BLOOD GLUCOSE TEST VI STRP
ORAL_STRIP | 12 refills | Status: DC
  Filled 2020-11-02: qty 100, 50d supply, fill #0

## 2020-03-12 MED ORDER — TRUE METRIX BLOOD GLUCOSE TEST VI STRP: ORAL_STRIP | 12 refills | Status: DC

## 2020-03-12 MED FILL — HYDROCHLOROTHIAZIDE 25 MG T: 25 | 30 days supply | Qty: 30 | Fill #0

## 2020-03-12 MED FILL — AMLODIPINE BESYLATE 10 MG T: 10 | 30 days supply | Qty: 30 | Fill #0

## 2020-03-12 MED FILL — TRUE METRIX TEST STRIP: 25 days supply | Qty: 100 | Fill #0

## 2020-03-12 MED FILL — METFORMIN HCL 500 MG TABS: 500 | 30 days supply | Qty: 60 | Fill #0

## 2020-03-12 MED FILL — TRUEplus LANCETS 28G MISC: 25 days supply | Qty: 100 | Fill #0

## 2020-03-12 MED FILL — LISINOPRIL 40 MG TABLET: 40 | 30 days supply | Qty: 30 | Fill #0

## 2020-03-12 NOTE — Progress Notes (Signed)
DM f/u   Request Flu and PNA vaccine. Received Covid Booster 1 week ago.

## 2020-03-12 NOTE — Patient Instructions (Signed)
Increase Metformin to 500 mg twice a day. Try to avoid eating full meals late at nights. Try to increase your walking to 3 times a week.   Obesity, Adult Obesity is having too much body fat. Being obese means that your weight is more than what is healthy for you. BMI is a number that explains how much body fat you have. If you have a BMI of 30 or more, you are obese. Obesity is often caused by eating or drinking more calories than your body uses. Changing your lifestyle can help you lose weight. Obesity can cause serious health problems, such as:  Stroke.  Coronary artery disease (CAD).  Type 2 diabetes.  Some types of cancer, including cancers of the colon, breast, uterus, and gallbladder.  Osteoarthritis.  High blood pressure (hypertension).  High cholesterol.  Sleep apnea.  Gallbladder stones.  Infertility problems. What are the causes?  Eating meals each day that are high in calories, sugar, and fat.  Being born with genes that may make you more likely to become obese.  Having a medical condition that causes obesity.  Taking certain medicines.  Sitting a lot (having a sedentary lifestyle).  Not getting enough sleep.  Drinking a lot of drinks that have sugar in them. What increases the risk?  Having a family history of obesity.  Being an [place] American woman.  Being a Hispanic man.  Living in an area with limited access to: ? [place] [facility], recreation centers, or sidewalks. ? Healthy food choices, such as grocery stores and farmers' markets. What are the signs or symptoms? The main sign is having too much body fat. How is this treated?  Treatment for this condition often includes changing your lifestyle. Treatment may include: ? Changing your diet. This may include making a healthy meal plan. ? Exercise. This may include activity that causes your heart to beat faster (aerobic exercise) and strength training. Work with your doctor to design a program that  works for you. ? Medicine to help you lose weight. This may be used if you are not able to lose 1 pound a week after 6 weeks of healthy eating and more exercise. ? Treating conditions that cause the obesity. ? Surgery. Options may include gastric banding and gastric bypass. This may be done if:  Other treatments have not helped to improve your condition.  You have a BMI of 40 or higher.  You have life-threatening health problems related to obesity. Follow these instructions at home: Eating and drinking   Follow advice from your doctor about what to eat and drink. Your doctor may tell you to: ? Limit fast food, sweets, and processed snack foods. ? Choose low-fat options. For example, choose low-fat milk instead of whole milk. ? Eat 5 or more servings of fruits or vegetables each day. ? Eat at home more often. This gives you more control over what you eat. ? Choose healthy foods when you eat out. ? Learn to read food labels. This will help you learn how much food is in 1 serving. ? Keep low-fat snacks available. ? Avoid drinks that have a lot of sugar in them. These include soda, fruit juice, iced tea with sugar, and flavored milk.  Drink enough water to keep your pee (urine) pale yellow.  Do not go on fad diets. Physical activity  Exercise often, as told by your doctor. Most adults should get up to 150 minutes of moderate-intensity exercise every week.Ask your doctor: ? What types of exercise are  safe for you. ? How often you should exercise.  Warm up and stretch before being active.  Do slow stretching after being active (cool down).  Rest between times of being active. Lifestyle  Work with your doctor and a food expert (dietitian) to set a weight-loss goal that is best for you.  Limit your screen time.  Find ways to reward yourself that do not involve food.  Do not drink alcohol if: ? Your doctor tells you not to drink. ? You are pregnant, may be pregnant, or are  planning to become pregnant.  If you drink alcohol: ? Limit how much you use to:  0-1 drink a day for women.  0-2 drinks a day for men. ? Be aware of how much alcohol is in your drink. In the [place] one drink equals one 12 oz bottle of beer (355 mL), one 5 oz glass of wine (148 mL), or one 1 oz glass of hard liquor (44 mL). General instructions  Keep a weight-loss journal. This can help you keep track of: ? The food that you eat. ? How much exercise you get.  Take over-the-counter and prescription medicines only as told by your doctor.  Take vitamins and supplements only as told by your doctor.  Think about joining a support group.  Keep all follow-up visits as told by your doctor. This is important. Contact a doctor if:  You cannot meet your weight loss goal after you have changed your diet and lifestyle for 6 weeks. Get help right away if you:  Are having trouble breathing.  Are having thoughts of harming yourself. Summary  Obesity is having too much body fat.  Being obese means that your weight is more than what is healthy for you.  Work with your doctor to set a weight-loss goal.  Get regular exercise as told by your doctor. This information is not intended to replace advice given to you by your health care provider. Make sure you discuss any questions you have with your health care provider. Document Revised: 12/24/2017 Document Reviewed: 12/24/2017 Elsevier Patient Education  2020 [employer].

## 2020-03-12 NOTE — Progress Notes (Signed)
Patient ID: Scott Mcmillan, male    DOB: 1960-10-09  MRN: [id number]  CC: Diabetes and Follow-up   Subjective: Scott Mcmillan is a 59 y.o. male who presents for chronic ds management. His concerns today include:  Patient with history of HTN, HL,morbidobesity, DM.  HM: Had COVID booster shot last wk.  Due for flu and Pneumovax  DIABETES TYPE 2 Last A1C:   Results for orders placed or performed in visit on 03/12/20  POCT glucose (manual entry)  Result Value Ref Range   POC Glucose 168 (A) 70 - 99 mg/dl  POCT glycosylated hemoglobin (Hb A1C)  Result Value Ref Range   Hemoglobin A1C 7.7 (A) 4.0 - 5.6 %   HbA1c POC (<> result, manual entry)     HbA1c, POC (prediabetic range)     HbA1c, POC (controlled diabetic range)      Med Adherence:  '[x]'  Yes  - On Metformin 500 mg daily Medication side effects:  '[]'  Yes    '[x]'  No Home Monitoring?  '[]'  Yes    '[x]'  No.  Out of stripes for a while Home glucose results range: Diet Adherence: '[]'  Yes    '[x]'  No - admits that he has been eating more and eating supper later; has supper around 9 p.m.  Gained 19 lbs since last visit in July 2021. Exercise:  Walks about 2 miles twice a wk.  Hypoglycemic episodes?: '[]'  Yes    '[x]'  No Numbness of the feet? '[]'  Yes    '[x]'  No Retinopathy hx? '[]'  Yes    '[x]'  No Last eye exam:  Comments:   HYPERTENSION Currently taking: see medication list Med Adherence: '[x]'  Yes    '[]'  No Medication side effects: '[]'  Yes    '[x]'  No Adherence with salt restriction: '[x]'  Yes    '[]'  No Home Monitoring?: '[x]'  Yes    '[]'  No Monitoring Frequency: per wk Home BP results range:  110-120/70-80 SOB? '[]'  Yes    '[x]'  No Chest Pain?: '[]'  Yes    '[x]'  No Leg swelling?: '[]'  Yes    '[x]'  No Headaches?: '[]'  Yes    '[x]'  No Dizziness? '[]'  Yes    '[x]'  No Comments:   HL:  Tolerating 40 mg Pravachol   Patient Active Problem List   Diagnosis Date Noted   Hyperlipidemia associated with type 2 diabetes mellitus ([place]) 11/08/2019   Influenza vaccine needed  01/13/2019   Non-recurrent bilateral inguinal hernia without obstruction or gangrene 04/23/2018   Hemorrhoids 01/22/2018   Actinic keratosis due to exposure to sunlight 07/21/2017   Lower extremity edema 07/21/2017   Class 3 severe obesity due to excess calories without serious comorbidity with body mass index (BMI) of 50.0 to 59.9 in adult [facility]) 07/21/2017   Hyperlipidemia with target low density lipoprotein (LDL) cholesterol less than 100 mg/dL 11/17/2016   Hypertension 10/31/2016     Current Outpatient Medications on File Prior to Visit  Medication Sig Dispense Refill   aspirin EC 81 MG tablet Take 1 tablet (81 mg total) by mouth daily. 90 tablet 3   Blood Glucose Monitoring Suppl (TRUE METRIX METER) w/Device KIT Use as directed 1 kit 0   carvedilol (COREG) 12.5 MG tablet Take 1 tablet (12.5 mg total) by mouth 2 (two) times daily with a meal. 60 tablet 6   pravastatin (PRAVACHOL) 40 MG tablet Take 1 tablet (40 mg total) by mouth daily. 30 tablet 6   No current facility-administered medications on file prior to visit.    No  Known Allergies  Social History   Socioeconomic History   Marital status: Single    Spouse name: Not on file   Number of children: Not on file   Years of education: Not on file   Highest education level: Not on file  Occupational History   Not on file  Tobacco Use   Smoking status: Never Smoker   Smokeless tobacco: Never Used  Vaping Use   Vaping Use: Never used  Substance and Sexual Activity   Alcohol use: No   Drug use: No   Sexual activity: Not on file  Other Topics Concern   Not on file  Social History Narrative   Not on file   Social Determinants of Health   Financial Resource Strain:    Difficulty of Paying Living Expenses: Not on file  Food Insecurity:    Worried About [place] in the Last Year: Not on file   Ran Out of Food in the Last Year: Not on file  Transportation Needs:    Lack of  Transportation (Medical): Not on file   Lack of Transportation (Non-Medical): Not on file  Physical Activity:    Days of Exercise per Week: Not on file   Minutes of Exercise per Session: Not on file  Stress:    Feeling of Stress : Not on file  Social Connections:    Frequency of Communication with Friends and Family: Not on file   Frequency of Social Gatherings with Friends and Family: Not on file   Attends Religious Services: Not on file   Active Member of Clubs or Organizations: Not on file   Attends Archivist Meetings: Not on file   Marital Status: Not on file  Intimate Partner Violence:    Fear of Current or Ex-Partner: Not on file   Emotionally Abused: Not on file   Physically Abused: Not on file   Sexually Abused: Not on file    Family History  Problem Relation Age of Onset   Colon cancer Neg Hx    Esophageal cancer Neg Hx    Liver cancer Neg Hx    Pancreatic cancer Neg Hx    Rectal cancer Neg Hx    Stomach cancer Neg Hx     No past surgical history on file.  ROS: Review of Systems Negative except as stated above  PHYSICAL EXAM: BP 114/78    Pulse 74    Temp 98 F (36.7 C) (Oral)    Resp 16    Wt (!) 314 lb (142.4 kg)    SpO2 96%    BMI 49.18 kg/m   Wt Readings from Last 3 Encounters:  03/12/20 (!) 314 lb (142.4 kg)  11/08/19 295 lb (133.8 kg)  08/11/19 (!) 301 lb 12.8 oz (136.9 kg)    Physical Exam General appearance - alert, well appearing, obese Caucasian male and in no distress Mental status - normal mood, behavior, speech, dress, motor activity, and thought processes Neck - supple, no significant adenopathy Chest - clear to auscultation, no wheezes, rales or rhonchi, symmetric air entry Heart - normal rate, regular rhythm, normal S1, S2, no murmurs, rubs, clicks or gallops Extremities - peripheral pulses normal, no pedal edema, no clubbing or cyanosis Diabetic Foot Exam - Simple   Simple Foot Form Visual Inspection See  comments: Yes Sensation Testing Intact to touch and monofilament testing bilaterally: Yes Pulse Check Posterior Tibialis and Dorsalis pulse intact bilaterally: Yes Comments Skin on the dorsal surface of very dry  and flaky.     CMP Latest Ref Rng & Units 11/11/2019 10/12/2018 10/22/2017  Glucose 65 - 99 mg/dL 96 109(H) 118(H)  BUN 8 - 27 mg/dL '14 16 12  ' Creatinine 0.76 - 1.27 mg/dL 0.98 1.15 0.89  Sodium 134 - 144 mmol/L 140 140 139  Potassium 3.5 - 5.2 mmol/L 3.9 3.7 3.8  Chloride 96 - 106 mmol/L 101 97 99  CO2 20 - 29 mmol/L '25 24 24  ' Calcium 8.6 - 10.2 mg/dL 9.5 9.7 9.9  Total Protein 6.0 - 8.5 g/dL 7.3 6.8 7.4  Total Bilirubin 0.0 - 1.2 mg/dL 0.4 0.5 0.6  Alkaline Phos 48 - 121 IU/L 46(L) 39 46  AST 0 - 40 IU/L '20 16 20  ' ALT 0 - 44 IU/L '25 16 19   ' Lipid Panel     Component Value Date/Time   CHOL 153 11/11/2019 1506   TRIG 166 (H) 11/11/2019 1506   HDL 32 (L) 11/11/2019 1506   CHOLHDL 4.8 11/11/2019 1506   LDLCALC 92 11/11/2019 1506    CBC    Component Value Date/Time   WBC 9.4 11/11/2019 1506   WBC 12.1 (H) 09/23/2016 0953   RBC 5.07 11/11/2019 1506   RBC 4.66 09/23/2016 0953   HGB 15.4 11/11/2019 1506   HCT 46.5 11/11/2019 1506   PLT 209 11/11/2019 1506   MCV 92 11/11/2019 1506   MCH 30.4 11/11/2019 1506   MCH 29.2 09/23/2016 0953   MCHC 33.1 11/11/2019 1506   MCHC 32.1 09/23/2016 0953   RDW 12.6 11/11/2019 1506    ASSESSMENT AND PLAN: 1. Type 2 diabetes mellitus with morbid obesity (HCC) Not at goal. Dietary counseling give Encouraged to increase walking to 3 x/wk Increase Metformin to 500 mg BID - POCT glucose (manual entry) - POCT glycosylated hemoglobin (Hb A1C) - TRUEplus Lancets 28G MISC; Use as directed  Dispense: 100 each; Refill: 4 - metFORMIN (GLUCOPHAGE) 500 MG tablet; Take 1 tablet (500 mg total) by mouth 2 (two) times daily with a meal.  Dispense: 60 tablet; Refill: 6 - glucose blood (TRUE METRIX BLOOD GLUCOSE TEST) test strip; Use as  instructed  Dispense: 100 each; Refill: 12  2. Essential hypertension At goal. Continue current meds - lisinopril (ZESTRIL) 40 MG tablet; Take 1 tablet (40 mg total) by mouth daily.  Dispense: 30 tablet; Refill: 6 - hydrochlorothiazide (HYDRODIURIL) 25 MG tablet; Take 1 tablet (25 mg total) by mouth daily.  Dispense: 30 tablet; Refill: 6 - amLODipine (NORVASC) 10 MG tablet; Take 1 tablet (10 mg total) by mouth daily.  Dispense: 30 tablet; Refill: 6  3. Hyperlipidemia associated with type 2 diabetes mellitus (HCC) -Continue Pravachol. Dose was increased to 40 mg based on labs done in July  4. Need for vaccination against Streptococcus pneumoniae given  5. Dry skin Recommend using moisturizing lotion BID  6. Need for immunization against influenza  - Flu Vaccine QUAD 36+ mos IM     Patient was given the opportunity to ask questions.  Patient verbalized understanding of the plan and was able to repeat key elements of the plan.   Orders Placed This Encounter  Procedures   Pneumococcal polysaccharide vaccine 23-valent greater than or equal to 2yo subcutaneous/IM   Flu Vaccine QUAD 36+ mos IM   POCT glucose (manual entry)   POCT glycosylated hemoglobin (Hb A1C)     Requested Prescriptions   Signed Prescriptions Disp Refills   TRUEplus Lancets 28G MISC 100 each 4    Sig: Use as  directed   metFORMIN (GLUCOPHAGE) 500 MG tablet 60 tablet 6    Sig: Take 1 tablet (500 mg total) by mouth 2 (two) times daily with a meal.   lisinopril (ZESTRIL) 40 MG tablet 30 tablet 6    Sig: Take 1 tablet (40 mg total) by mouth daily.   hydrochlorothiazide (HYDRODIURIL) 25 MG tablet 30 tablet 6    Sig: Take 1 tablet (25 mg total) by mouth daily.   amLODipine (NORVASC) 10 MG tablet 30 tablet 6    Sig: Take 1 tablet (10 mg total) by mouth daily.   glucose blood (TRUE METRIX BLOOD GLUCOSE TEST) test strip 100 each 12    Sig: Use as instructed    Return in about 4 months (around  07/10/2020).  Karle Plumber, MD, FACP

## 2020-03-20 MED FILL — ?PRAVASTATIN NA 40MG TAB: 40 | 30 days supply | Qty: 30 | Fill #1

## 2020-04-25 MED FILL — LISINOPRIL 40 MG TABS: 40 | 30 days supply | Qty: 30 | Fill #1

## 2020-04-25 MED FILL — AMLODIPINE BESYLATE 10 MG T: 10 | 30 days supply | Qty: 30 | Fill #1

## 2020-04-25 MED FILL — ?CARVEDILOL 12.5 MG TABLET: 12.5 | 30 days supply | Qty: 60 | Fill #2

## 2020-04-25 MED FILL — ?PRAVASTATIN NA 40MG TAB: 40 | 30 days supply | Qty: 30 | Fill #2

## 2020-04-25 MED FILL — HYDROCHLOROTHIAZIDE 25 MG T: 25 | 30 days supply | Qty: 30 | Fill #1

## 2020-04-25 MED FILL — METFORMIN HCL 500 MG TABS: 500 | 30 days supply | Qty: 60 | Fill #1

## 2020-05-24 ENCOUNTER — Telehealth: Payer: Self-pay | Admitting: Internal Medicine

## 2020-05-24 DIAGNOSIS — I10 Essential (primary) hypertension: Secondary | ICD-10-CM

## 2020-05-24 DIAGNOSIS — E785 Hyperlipidemia, unspecified: Secondary | ICD-10-CM

## 2020-05-24 DIAGNOSIS — E1169 Type 2 diabetes mellitus with other specified complication: Secondary | ICD-10-CM

## 2020-05-24 MED FILL — ?CARVEDILOL 12.5 MG TABLET: 12.5 | 30 days supply | Qty: 60 | Fill #3

## 2020-05-24 MED FILL — HYDROCHLOROTHIAZIDE 25 MG T: 25 | 30 days supply | Qty: 30 | Fill #2

## 2020-05-24 MED FILL — AMLODIPINE BESYLATE 10 MG T: 10 | 30 days supply | Qty: 30 | Fill #2

## 2020-05-24 MED FILL — ?PRAVASTATIN NA 40MG TAB: 40 | 30 days supply | Qty: 30 | Fill #3

## 2020-05-24 MED FILL — METFORMIN HCL 500 MG TABS: 500 | 30 days supply | Qty: 60 | Fill #2

## 2020-05-24 MED FILL — LISINOPRIL 40 MG TABLET: 40 | 30 days supply | Qty: 30 | Fill #2

## 2020-05-24 NOTE — Telephone Encounter (Signed)
Medication: hydrochlorothiazide (HYDRODIURIL) 25 MG tablet [[id number]] , metFORMIN (GLUCOPHAGE) 500 MG tablet [[id number]] , amLODipine (NORVASC) 10 MG tablet [[id number]] , lisinopril (ZESTRIL) 40 MG tablet [[id number]] , pravastatin (PRAVACHOL) 40 MG tablet [[id number]] , carvedilol (COREG) 12.5 MG tablet [[id number]]   Has the patient contacted their pharmacy? YES  (Agent: If no, request that the patient contact the pharmacy for the refill.) (Agent: If yes, when and what did the pharmacy advise?)  Preferred Pharmacy (with phone number or street name): [facility] - [place], [place] - [place] [address] [place] [address] Phone: [phone] Fax: [phone] Hours: Not open 24 hours    Agent: Please be advised that RX refills may take up to 3 business days. We ask that you follow-up with your pharmacy.

## 2020-05-25 ENCOUNTER — Other Ambulatory Visit: Payer: Self-pay | Admitting: Internal Medicine

## 2020-05-25 MED ORDER — METFORMIN HCL 500 MG PO TABS: 500.0000 mg | ORAL_TABLET | Freq: Two times a day (BID) | ORAL | 2 refills | Status: DC

## 2020-05-25 MED ORDER — LISINOPRIL 40 MG PO TABS: 40.0000 mg | ORAL_TABLET | Freq: Every day | ORAL | 2 refills | Status: DC

## 2020-05-25 MED ORDER — CARVEDILOL 12.5 MG PO TABS: 12.5000 mg | ORAL_TABLET | Freq: Two times a day (BID) | ORAL | 2 refills | Status: DC

## 2020-05-25 MED ORDER — PRAVASTATIN SODIUM 40 MG PO TABS
40.0000 mg | ORAL_TABLET | Freq: Every day | ORAL | 2 refills | Status: DC
  Filled 2020-08-08: qty 30, 30d supply, fill #0

## 2020-05-25 MED ORDER — HYDROCHLOROTHIAZIDE 25 MG PO TABS: 25.0000 mg | ORAL_TABLET | Freq: Every day | ORAL | 2 refills | Status: DC

## 2020-05-25 MED ORDER — AMLODIPINE BESYLATE 10 MG PO TABS: 10.0000 mg | ORAL_TABLET | Freq: Every day | ORAL | 2 refills | Status: DC

## 2020-05-25 NOTE — Telephone Encounter (Signed)
Rx sent 

## 2020-07-04 MED FILL — METFORMIN HCL 500 MG TABS: 500 | 30 days supply | Qty: 60 | Fill #3

## 2020-07-04 MED FILL — LISINOPRIL 40 MG TAB: 40 | 30 days supply | Qty: 30 | Fill #3

## 2020-07-04 MED FILL — CARVEDILOL 12.5 MG TABLET: 12.5 | 30 days supply | Qty: 60 | Fill #0

## 2020-07-04 MED FILL — AMLODIPINE BESYLATE 10 MG T: 10 | 30 days supply | Qty: 30 | Fill #3

## 2020-07-04 MED FILL — HYDROCHLOROTHIAZIDE 25 MG T: 25 | 30 days supply | Qty: 30 | Fill #3

## 2020-07-04 MED FILL — ?PRAVASTATIN NA 40MG TAB: 40 | 30 days supply | Qty: 30 | Fill #0

## 2020-07-10 ENCOUNTER — Ambulatory Visit: Payer: Self-pay | Admitting: Internal Medicine

## 2020-07-16 ENCOUNTER — Ambulatory Visit: Payer: Self-pay | Attending: Internal Medicine

## 2020-07-16 ENCOUNTER — Other Ambulatory Visit: Payer: Self-pay

## 2020-08-04 ENCOUNTER — Other Ambulatory Visit: Payer: Self-pay

## 2020-08-08 ENCOUNTER — Ambulatory Visit: Payer: Self-pay | Attending: Physician Assistant | Admitting: Physician Assistant

## 2020-08-08 ENCOUNTER — Encounter: Payer: Self-pay | Admitting: Physician Assistant

## 2020-08-08 ENCOUNTER — Other Ambulatory Visit: Payer: Self-pay

## 2020-08-08 DIAGNOSIS — E1169 Type 2 diabetes mellitus with other specified complication: Secondary | ICD-10-CM

## 2020-08-08 DIAGNOSIS — E785 Hyperlipidemia, unspecified: Secondary | ICD-10-CM

## 2020-08-08 DIAGNOSIS — I10 Essential (primary) hypertension: Secondary | ICD-10-CM

## 2020-08-08 MED ORDER — PRAVASTATIN SODIUM 40 MG PO TABS
40.0000 mg | ORAL_TABLET | Freq: Every day | ORAL | 2 refills | Status: DC
  Filled 2020-08-08 – 2020-09-06 (×2): qty 30, 30d supply, fill #0
  Filled 2020-10-10: qty 30, 30d supply, fill #1
  Filled 2020-11-02: qty 30, 30d supply, fill #2

## 2020-08-08 MED ORDER — HYDROCHLOROTHIAZIDE 25 MG PO TABS
ORAL_TABLET | Freq: Every day | ORAL | 2 refills | Status: DC
Start: 2020-08-08 — End: 2020-12-13
  Filled 2020-08-08: qty 30, fill #0
  Filled 2020-09-06: qty 30, 30d supply, fill #0
  Filled 2020-10-10: qty 30, 30d supply, fill #1
  Filled 2020-11-02: qty 30, 30d supply, fill #2

## 2020-08-08 MED ORDER — LISINOPRIL 40 MG PO TABS
ORAL_TABLET | Freq: Every day | ORAL | 6 refills | Status: DC
  Filled 2020-08-08: qty 30, fill #0
  Filled 2020-09-06: qty 30, 30d supply, fill #0
  Filled 2020-10-10: qty 30, 30d supply, fill #1
  Filled 2020-11-02: qty 30, 30d supply, fill #2
  Filled 2020-12-13: qty 30, 30d supply, fill #3
  Filled 2021-01-11: qty 30, 30d supply, fill #4
  Filled 2021-02-21: qty 30, 30d supply, fill #5
  Filled 2021-03-20: qty 30, 30d supply, fill #6

## 2020-08-08 MED ORDER — AMLODIPINE BESYLATE 10 MG PO TABS
ORAL_TABLET | Freq: Every day | ORAL | 6 refills | Status: DC
  Filled 2020-08-08: qty 30, fill #0
  Filled 2020-09-06: qty 30, 30d supply, fill #0
  Filled 2020-10-10: qty 30, 30d supply, fill #1
  Filled 2020-11-02: qty 30, 30d supply, fill #2
  Filled 2020-12-13: qty 30, 30d supply, fill #3
  Filled 2021-01-11: qty 30, 30d supply, fill #4
  Filled 2021-02-21: qty 30, 30d supply, fill #5
  Filled 2021-03-20: qty 30, 30d supply, fill #6

## 2020-08-08 MED ORDER — CARVEDILOL 12.5 MG PO TABS
ORAL_TABLET | Freq: Two times a day (BID) | ORAL | 2 refills | Status: DC
  Filled 2020-08-08: qty 60, fill #0
  Filled 2020-09-06: qty 60, 30d supply, fill #0
  Filled 2020-10-10: qty 60, 30d supply, fill #1
  Filled 2020-11-02: qty 60, 30d supply, fill #2

## 2020-08-08 MED ORDER — METFORMIN HCL 500 MG PO TABS
ORAL_TABLET | Freq: Two times a day (BID) | ORAL | 2 refills | Status: DC
  Filled 2020-08-08: qty 60, fill #0
  Filled 2020-09-06: qty 60, 30d supply, fill #0
  Filled 2020-10-10: qty 60, 30d supply, fill #1
  Filled 2020-11-02: qty 60, 30d supply, fill #2

## 2020-08-08 MED FILL — Carvedilol Tab 12.5 MG: ORAL | 30 days supply | Qty: 60 | Fill #0 | Status: AC

## 2020-08-08 MED FILL — Amlodipine Besylate Tab 10 MG (Base Equivalent): ORAL | 30 days supply | Qty: 30 | Fill #0 | Status: AC

## 2020-08-08 MED FILL — Metformin HCl Tab 500 MG: ORAL | 30 days supply | Qty: 60 | Fill #0 | Status: AC

## 2020-08-08 MED FILL — Hydrochlorothiazide Tab 25 MG: ORAL | 30 days supply | Qty: 30 | Fill #0 | Status: AC

## 2020-08-08 MED FILL — Lisinopril Tab 40 MG: ORAL | 30 days supply | Qty: 30 | Fill #0 | Status: AC

## 2020-08-08 NOTE — Progress Notes (Signed)
Virtual Visit via Telephone Note  I connected with Scott Mcmillan on 08/08/20 at  1:50 PM EDT by telephone and verified that I am speaking with the correct person using two identifiers.  Location: Patient: home Provider: [facility] office   I discussed the limitations, risks, security and privacy concerns of performing an evaluation and management service by telephone and the availability of in person appointments. I also discussed with the patient that there may be a patient responsible charge related to this service. The patient expressed understanding and agreed to proceed.   History of Present Illness:  Needs med RF.  Blood sugars running from 110-140s.  He is doing well.  No new issues or concerns.  Compliant with meds.  No HA/CP/dizziness.  Observations/Objective:  NAD.  A&Ox3   Assessment and Plan: 1. Type 2 diabetes mellitus with morbid obesity (HCC) - Hemoglobin A1c; Future - Comprehensive metabolic panel; Future - metFORMIN (GLUCOPHAGE) 500 MG tablet; TAKE 1 TABLET (500 MG TOTAL) BY MOUTH 2 (TWO) TIMES DAILY WITH A MEAL.  Dispense: 60 tablet; Refill: 2  2. Essential hypertension BP 03/12/2020=114/78 on current regimen - Comprehensive metabolic panel; Future - CBC with Differential/Platelet; Future - lisinopril (ZESTRIL) 40 MG tablet; TAKE 1 TABLET (40 MG TOTAL) BY MOUTH DAILY.  Dispense: 30 tablet; Refill: 6 - hydrochlorothiazide (HYDRODIURIL) 25 MG tablet; TAKE 1 TABLET (25 MG TOTAL) BY MOUTH DAILY.  Dispense: 30 tablet; Refill: 2 - carvedilol (COREG) 12.5 MG tablet; TAKE 1 TABLET (12.5 MG TOTAL) BY MOUTH 2 (TWO) TIMES DAILY WITH A MEAL.  Dispense: 60 tablet; Refill: 2 - amLODipine (NORVASC) 10 MG tablet; TAKE 1 TABLET (10 MG TOTAL) BY MOUTH DAILY.  Dispense: 30 tablet; Refill: 6  3. Hyperlipidemia, unspecified hyperlipidemia type - Comprehensive metabolic panel; Future - pravastatin (PRAVACHOL) 40 MG tablet; Take 1 tablet (40 mg total) by mouth daily.  Dispense: 30 tablet;  Refill: 2 - Lipid panel; Future    Follow Up Instructions: See PCP in 3 months    I discussed the assessment and treatment plan with the patient. The patient was provided an opportunity to ask questions and all were answered. The patient agreed with the plan and demonstrated an understanding of the instructions.   The patient was advised to call back or seek an in-person evaluation if the symptoms worsen or if the condition fails to improve as anticipated.  I provided 11 minutes of non-face-to-face time during this encounter.   Georgian Co, PA-C  Patient ID: Scott Mcmillan, male   DOB: 1961-02-05, 60 y.o.   MRN: [id number]

## 2020-09-06 ENCOUNTER — Other Ambulatory Visit: Payer: Self-pay

## 2020-09-07 ENCOUNTER — Other Ambulatory Visit: Payer: Self-pay

## 2020-10-10 ENCOUNTER — Other Ambulatory Visit: Payer: Self-pay

## 2020-10-11 ENCOUNTER — Other Ambulatory Visit: Payer: Self-pay

## 2020-11-02 ENCOUNTER — Other Ambulatory Visit: Payer: Self-pay

## 2020-11-02 MED FILL — Lancets: 30 days supply | Qty: 100 | Fill #0 | Status: AC

## 2020-11-06 ENCOUNTER — Other Ambulatory Visit: Payer: Self-pay

## 2020-11-08 ENCOUNTER — Ambulatory Visit: Payer: Self-pay | Admitting: Internal Medicine

## 2020-12-13 ENCOUNTER — Other Ambulatory Visit: Payer: Self-pay

## 2020-12-13 ENCOUNTER — Other Ambulatory Visit: Payer: Self-pay | Admitting: Physician Assistant

## 2020-12-13 DIAGNOSIS — I10 Essential (primary) hypertension: Secondary | ICD-10-CM

## 2020-12-13 DIAGNOSIS — E1169 Type 2 diabetes mellitus with other specified complication: Secondary | ICD-10-CM

## 2020-12-13 DIAGNOSIS — E785 Hyperlipidemia, unspecified: Secondary | ICD-10-CM

## 2020-12-14 ENCOUNTER — Other Ambulatory Visit: Payer: Self-pay | Admitting: Pharmacist

## 2020-12-14 ENCOUNTER — Other Ambulatory Visit: Payer: Self-pay

## 2020-12-14 DIAGNOSIS — E785 Hyperlipidemia, unspecified: Secondary | ICD-10-CM

## 2020-12-14 MED ORDER — HYDROCHLOROTHIAZIDE 25 MG PO TABS
ORAL_TABLET | Freq: Every day | ORAL | 2 refills | Status: DC
  Filled 2020-12-14: qty 30, 30d supply, fill #0
  Filled 2021-01-11: qty 30, 30d supply, fill #1

## 2020-12-14 MED ORDER — CARVEDILOL 12.5 MG PO TABS
ORAL_TABLET | Freq: Two times a day (BID) | ORAL | 2 refills | Status: DC
  Filled 2020-12-14: qty 60, 30d supply, fill #0
  Filled 2021-01-11: qty 60, 30d supply, fill #1

## 2020-12-14 MED ORDER — PRAVASTATIN SODIUM 40 MG PO TABS
40.0000 mg | ORAL_TABLET | Freq: Every day | ORAL | 2 refills | Status: DC
Start: 2020-12-14 — End: 2021-02-04
  Filled 2020-12-14: qty 30, 30d supply, fill #0
  Filled 2021-01-11: qty 30, 30d supply, fill #1

## 2020-12-14 MED ORDER — METFORMIN HCL 500 MG PO TABS
ORAL_TABLET | Freq: Two times a day (BID) | ORAL | 2 refills | Status: DC
  Filled 2020-12-14: qty 60, 30d supply, fill #0
  Filled 2021-01-11: qty 60, 30d supply, fill #1

## 2020-12-14 NOTE — Telephone Encounter (Signed)
Requested medications are due for refill today yes  Requested medications are on the active medication list yes  Last refill 7/5  Last visit 08/2020, had appt 7/7 as asked to return in 3 months, pt canceled  Future visit scheduled 02/04/21  Notes to clinic Failed protocol due to no lipid labs within 360 days, please assess.

## 2020-12-19 ENCOUNTER — Other Ambulatory Visit: Payer: Self-pay

## 2021-01-11 ENCOUNTER — Other Ambulatory Visit: Payer: Self-pay

## 2021-01-21 ENCOUNTER — Ambulatory Visit: Payer: Self-pay | Attending: Internal Medicine

## 2021-01-21 ENCOUNTER — Other Ambulatory Visit: Payer: Self-pay

## 2021-02-04 ENCOUNTER — Other Ambulatory Visit: Payer: Self-pay

## 2021-02-04 ENCOUNTER — Encounter: Payer: Self-pay | Admitting: Internal Medicine

## 2021-02-04 ENCOUNTER — Ambulatory Visit: Payer: Self-pay | Attending: Internal Medicine | Admitting: Internal Medicine

## 2021-02-04 DIAGNOSIS — E1169 Type 2 diabetes mellitus with other specified complication: Secondary | ICD-10-CM

## 2021-02-04 DIAGNOSIS — Z23 Encounter for immunization: Secondary | ICD-10-CM

## 2021-02-04 DIAGNOSIS — I152 Hypertension secondary to endocrine disorders: Secondary | ICD-10-CM

## 2021-02-04 DIAGNOSIS — Z125 Encounter for screening for malignant neoplasm of prostate: Secondary | ICD-10-CM

## 2021-02-04 DIAGNOSIS — E1159 Type 2 diabetes mellitus with other circulatory complications: Secondary | ICD-10-CM

## 2021-02-04 DIAGNOSIS — E785 Hyperlipidemia, unspecified: Secondary | ICD-10-CM

## 2021-02-04 LAB — POCT GLYCOSYLATED HEMOGLOBIN (HGB A1C): HbA1c, POC (controlled diabetic range): 6.7 % (ref 0.0–7.0)

## 2021-02-04 LAB — GLUCOSE, POCT (MANUAL RESULT ENTRY): POC Glucose: 139 mg/dl — AB (ref 70–99)

## 2021-02-04 MED ORDER — CARVEDILOL 12.5 MG PO TABS
12.5000 mg | ORAL_TABLET | Freq: Two times a day (BID) | ORAL | 6 refills | Status: DC
  Filled 2021-02-04: qty 60, 30d supply, fill #0
  Filled 2021-04-23 (×2): qty 60, 30d supply, fill #1
  Filled 2021-05-28: qty 60, 30d supply, fill #0
  Filled 2021-06-21: qty 60, 30d supply, fill #1
  Filled 2021-08-21: qty 60, 30d supply, fill #2
  Filled 2021-09-25: qty 60, 30d supply, fill #3
  Filled 2021-10-28: qty 60, 30d supply, fill #4

## 2021-02-04 MED ORDER — HYDROCHLOROTHIAZIDE 25 MG PO TABS
ORAL_TABLET | Freq: Every day | ORAL | 6 refills | Status: DC
Start: 2021-02-04 — End: 2021-07-29
  Filled 2021-02-04: qty 30, 30d supply, fill #0
  Filled 2021-03-20: qty 30, 30d supply, fill #1
  Filled 2021-04-23: qty 30, 30d supply, fill #2
  Filled 2021-05-28: qty 30, 30d supply, fill #0
  Filled 2021-06-21: qty 30, 30d supply, fill #1
  Filled 2021-07-23: qty 30, 30d supply, fill #2

## 2021-02-04 MED ORDER — PRAVASTATIN SODIUM 40 MG PO TABS
40.0000 mg | ORAL_TABLET | Freq: Every day | ORAL | 6 refills | Status: DC
Start: 2021-02-04 — End: 2021-09-25
  Filled 2021-02-04: qty 30, 30d supply, fill #0
  Filled 2021-03-20: qty 30, 30d supply, fill #1
  Filled 2021-04-23: qty 30, 30d supply, fill #2
  Filled 2021-05-28: qty 30, 30d supply, fill #0
  Filled 2021-06-21: qty 30, 30d supply, fill #1
  Filled 2021-07-23: qty 30, 30d supply, fill #2
  Filled 2021-08-21: qty 30, 30d supply, fill #3

## 2021-02-04 MED ORDER — TRUE METRIX BLOOD GLUCOSE TEST VI STRP
ORAL_STRIP | 12 refills | Status: DC
  Filled 2021-02-04: qty 100, 30d supply, fill #0

## 2021-02-04 MED ORDER — TRUE METRIX METER W/DEVICE KIT
PACK | 0 refills | Status: DC
  Filled 2021-02-04: qty 1, 365d supply, fill #0

## 2021-02-04 MED ORDER — METFORMIN HCL 500 MG PO TABS
ORAL_TABLET | Freq: Two times a day (BID) | ORAL | 6 refills | Status: DC
Start: 2021-02-04 — End: 2021-07-29
  Filled 2021-02-04: qty 60, 30d supply, fill #0
  Filled 2021-04-23: qty 60, 30d supply, fill #1
  Filled 2021-05-28: qty 60, 30d supply, fill #0
  Filled 2021-06-21: qty 60, 30d supply, fill #1

## 2021-02-04 NOTE — Patient Instructions (Signed)

## 2021-02-04 NOTE — Progress Notes (Signed)
Patient ID: Scott Mcmillan, male    DOB: 1960-05-10  MRN: [id number]  CC: Diabetes and Hypertension   Subjective: Scott Mcmillan is a 60 y.o. male who presents for chronic ds management.  Last seen 03/2020. His concerns today include:  Patient with history of HTN, HL, morbid obesity, DM.  HYPERTENSION Currently taking: see medication list Med Adherence: '[x]'  Yes    '[]'  No Medication side effects: '[]'  Yes    '[x]'  No Adherence with salt restriction: '[x]'  Yes    '[]'  No Home Monitoring?: '[x]'  Yes    '[]'  No Monitoring Frequency: daily Home BP results range: 120s/80s SOB? '[]'  Yes    '[x]'  No Chest Pain?: '[]'  Yes    '[x]'  No Leg swelling?: '[]'  Yes    '[x]'  No Headaches?: '[]'  Yes    '[x]'  No Dizziness? '[]'  Yes    '[x]'  No Comments:   DIABETES TYPE 2/Obesity Last A1C:   Results for orders placed or performed in visit on 02/04/21  POCT glucose (manual entry)  Result Value Ref Range   POC Glucose 139 (A) 70 - 99 mg/dl  POCT glycosylated hemoglobin (Hb A1C)  Result Value Ref Range   Hemoglobin A1C     HbA1c POC (<> result, manual entry)     HbA1c, POC (prediabetic range)     HbA1c, POC (controlled diabetic range) 6.7 0.0 - 7.0 %    Med Adherence:  '[x]'  Yes    '[]'  No Medication side effects:  '[]'  Yes    '[x]'  No Home Monitoring?  '[x]'  Yes but needs new stripes and getting error code on meter.      '[]'  No Home glucose results range: Diet Adherence: "Not the best.  I just eat stuff I shouldn't eat."  Needs to work on not Hydrologist like Twinkies and sodas. Exercise: '[x]'  Yes -walking but not as much as he should    '[]'  No Hypoglycemic episodes?: '[]'  Yes    '[x]'  No Numbness of the feet? '[x]'  Yes -in toes    '[]'  No Retinopathy hx? '[]'  Yes    '[]'  No Last eye exam:  over due.  Lack insurance.  Plans to get it done in the next several wks Comments:   HL: Taking and tolerating Pravachol.  HM: Due for flu vaccine and Shingrix.  Over due for eye exam.  He is agreeable to PSA level for prostate cancer  screening.  Patient Active Problem List   Diagnosis Date Noted   Hyperlipidemia associated with type 2 diabetes mellitus ([place]) 11/08/2019   Influenza vaccine needed 01/13/2019   Non-recurrent bilateral inguinal hernia without obstruction or gangrene 04/23/2018   Hemorrhoids 01/22/2018   Actinic keratosis due to exposure to sunlight 07/21/2017   Lower extremity edema 07/21/2017   Class 3 severe obesity due to excess calories without serious comorbidity with body mass index (BMI) of 50.0 to 59.9 in adult [facility]) 07/21/2017   Hyperlipidemia with target low density lipoprotein (LDL) cholesterol less than 100 mg/dL 11/17/2016   Hypertension 10/31/2016     Current Outpatient Medications on File Prior to Visit  Medication Sig Dispense Refill   amLODipine (NORVASC) 10 MG tablet TAKE 1 TABLET (10 MG TOTAL) BY MOUTH DAILY. 30 tablet 6   aspirin EC 81 MG tablet Take 1 tablet (81 mg total) by mouth daily. 90 tablet 3   lisinopril (ZESTRIL) 40 MG tablet TAKE 1 TABLET (40 MG TOTAL) BY MOUTH DAILY. 30 tablet 6   TRUEplus Lancets 28G MISC USE AS DIRECTED 100  each 4   No current facility-administered medications on file prior to visit.    No Known Allergies  Social History   Socioeconomic History   Marital status: Single    Spouse name: Not on file   Number of children: Not on file   Years of education: Not on file   Highest education level: Not on file  Occupational History   Not on file  Tobacco Use   Smoking status: Never   Smokeless tobacco: Never  Vaping Use   Vaping Use: Never used  Substance and Sexual Activity   Alcohol use: No   Drug use: No   Sexual activity: Not on file  Other Topics Concern   Not on file  Social History Narrative   Not on file   Social Determinants of Health   Financial Resource Strain: Not on file  Food Insecurity: Not on file  Transportation Needs: Not on file  Physical Activity: Not on file  Stress: Not on file  Social Connections: Not on file   Intimate Partner Violence: Not on file    Family History  Problem Relation Age of Onset   Colon cancer Neg Hx    Esophageal cancer Neg Hx    Liver cancer Neg Hx    Pancreatic cancer Neg Hx    Rectal cancer Neg Hx    Stomach cancer Neg Hx     No past surgical history on file.  ROS: Review of Systems Negative except as stated above  PHYSICAL EXAM: BP 110/71   Pulse 75   Resp 16   Ht '5\' 7"'  (1.702 m)   Wt (!) 307 lb 9.6 oz (139.5 kg)   SpO2 95%   BMI 48.18 kg/m   Wt Readings from Last 3 Encounters:  02/04/21 (!) 307 lb 9.6 oz (139.5 kg)  03/12/20 (!) 314 lb (142.4 kg)  11/08/19 295 lb (133.8 kg)    Physical Exam   General appearance - alert, well appearing, obese older Caucasian male and in no distress Mental status - normal mood, behavior, speech, dress, motor activity, and thought processes Mouth - mucous membranes moist, pharynx normal without lesions Neck - supple, no significant adenopathy Chest - clear to auscultation, no wheezes, rales or rhonchi, symmetric air entry Heart - normal rate, regular rhythm, normal S1, S2, no murmurs, rubs, clicks or gallops Extremities - peripheral pulses normal, no pedal edema, no clubbing or cyanosis  CMP Latest Ref Rng & Units 11/11/2019 10/12/2018 10/22/2017  Glucose 65 - 99 mg/dL 96 109(H) 118(H)  BUN 8 - 27 mg/dL '14 16 12  ' Creatinine 0.76 - 1.27 mg/dL 0.98 1.15 0.89  Sodium 134 - 144 mmol/L 140 140 139  Potassium 3.5 - 5.2 mmol/L 3.9 3.7 3.8  Chloride 96 - 106 mmol/L 101 97 99  CO2 20 - 29 mmol/L '25 24 24  ' Calcium 8.6 - 10.2 mg/dL 9.5 9.7 9.9  Total Protein 6.0 - 8.5 g/dL 7.3 6.8 7.4  Total Bilirubin 0.0 - 1.2 mg/dL 0.4 0.5 0.6  Alkaline Phos 48 - 121 IU/L 46(L) 39 46  AST 0 - 40 IU/L '20 16 20  ' ALT 0 - 44 IU/L '25 16 19   ' Lipid Panel     Component Value Date/Time   CHOL 153 11/11/2019 1506   TRIG 166 (H) 11/11/2019 1506   HDL 32 (L) 11/11/2019 1506   CHOLHDL 4.8 11/11/2019 1506   LDLCALC 92 11/11/2019 1506     CBC    Component Value Date/Time  WBC 9.4 11/11/2019 1506   WBC 12.1 (H) 09/23/2016 0953   RBC 5.07 11/11/2019 1506   RBC 4.66 09/23/2016 0953   HGB 15.4 11/11/2019 1506   HCT 46.5 11/11/2019 1506   PLT 209 11/11/2019 1506   MCV 92 11/11/2019 1506   MCH 30.4 11/11/2019 1506   MCH 29.2 09/23/2016 0953   MCHC 33.1 11/11/2019 1506   MCHC 32.1 09/23/2016 0953   RDW 12.6 11/11/2019 1506    ASSESSMENT AND PLAN: 1. Type 2 diabetes mellitus with morbid obesity (HCC) Continue metformin. Discussed the importance of healthy eating habits, regular aerobic exercise (at least 150 minutes a week as tolerated) and medication compliance to achieve or maintain control of diabetes. Advised patient to not buy sugary snacks or sugary drinks.  We went over healthier snacks that can be purchased including fruits -Encouraged to get eye exam and have the optometrist ophthalmologist send me a report. - POCT glucose (manual entry) - POCT glycosylated hemoglobin (Hb A1C) - Microalbumin / creatinine urine ratio - metFORMIN (GLUCOPHAGE) 500 MG tablet; TAKE 1 TABLET (500 MG TOTAL) BY MOUTH 2 (TWO) TIMES DAILY WITH A MEAL.  Dispense: 60 tablet; Refill: 6 - Blood Glucose Monitoring Suppl (TRUE METRIX METER) w/Device KIT; Use as directed  Dispense: 1 kit; Refill: 0 - glucose blood (TRUE METRIX BLOOD GLUCOSE TEST) test strip; Use as instructed  Dispense: 100 each; Refill: 12 - CBC - Comprehensive metabolic panel - Lipid panel  2. Hypertension associated with diabetes ([place]) At goal.  Continue current medications and low-salt diet - carvedilol (COREG) 12.5 MG tablet; Take 1 tablet (12.5 mg total) by mouth 2 (two) times daily with a meal.  Dispense: 60 tablet; Refill: 6 - hydrochlorothiazide (HYDRODIURIL) 25 MG tablet; TAKE 1 TABLET (25 MG TOTAL) BY MOUTH DAILY.  Dispense: 30 tablet; Refill: 6  3. Hyperlipidemia, unspecified hyperlipidemia type - pravastatin (PRAVACHOL) 40 MG tablet; Take 1 tablet (40 mg  total) by mouth daily.  Dispense: 30 tablet; Refill: 6  4. Need for immunization against influenza - Flu Vaccine QUAD 10moIM (Fluarix, Fluzone & Alfiuria Quad PF)  5. Prostate cancer screening Patient agreeable to prostate cancer screening. - PSA    Patient was given the opportunity to ask questions.  Patient verbalized understanding of the plan and was able to repeat key elements of the plan.   Orders Placed This Encounter  Procedures   Flu Vaccine QUAD 691moM (Fluarix, Fluzone & Alfiuria Quad PF)   Microalbumin / creatinine urine ratio   CBC   Comprehensive metabolic panel   Lipid panel   PSA   POCT glucose (manual entry)   POCT glycosylated hemoglobin (Hb A1C)     Requested Prescriptions   Signed Prescriptions Disp Refills   carvedilol (COREG) 12.5 MG tablet 60 tablet 6    Sig: Take 1 tablet (12.5 mg total) by mouth 2 (two) times daily with a meal.   pravastatin (PRAVACHOL) 40 MG tablet 30 tablet 6    Sig: Take 1 tablet (40 mg total) by mouth daily.   hydrochlorothiazide (HYDRODIURIL) 25 MG tablet 30 tablet 6    Sig: TAKE 1 TABLET (25 MG TOTAL) BY MOUTH DAILY.   metFORMIN (GLUCOPHAGE) 500 MG tablet 60 tablet 6    Sig: TAKE 1 TABLET (500 MG TOTAL) BY MOUTH 2 (TWO) TIMES DAILY WITH A MEAL.   Blood Glucose Monitoring Suppl (TRUE METRIX METER) w/Device KIT 1 kit 0    Sig: Use as directed   glucose blood (TRUE METRIX BLOOD GLUCOSE  TEST) test strip 100 each 12    Sig: Use as instructed    Return in about 4 months (around 06/07/2021).  Karle Plumber, MD, FACP

## 2021-02-05 ENCOUNTER — Other Ambulatory Visit: Payer: Self-pay

## 2021-02-05 LAB — COMPREHENSIVE METABOLIC PANEL
ALT: 26 IU/L (ref 0–44)
AST: 22 IU/L (ref 0–40)
Albumin/Globulin Ratio: 1.3 (ref 1.2–2.2)
Albumin: 4.3 g/dL (ref 3.8–4.8)
Alkaline Phosphatase: 43 IU/L — ABNORMAL LOW (ref 44–121)
BUN/Creatinine Ratio: 18 (ref 10–24)
BUN: 21 mg/dL (ref 8–27)
Bilirubin Total: 0.7 mg/dL (ref 0.0–1.2)
CO2: 26 mmol/L (ref 20–29)
Calcium: 9.6 mg/dL (ref 8.6–10.2)
Chloride: 94 mmol/L — ABNORMAL LOW (ref 96–106)
Creatinine, Ser: 1.18 mg/dL (ref 0.76–1.27)
Globulin, Total: 3.3 g/dL (ref 1.5–4.5)
Glucose: 118 mg/dL — ABNORMAL HIGH (ref 70–99)
Potassium: 4.1 mmol/L (ref 3.5–5.2)
Sodium: 140 mmol/L (ref 134–144)
Total Protein: 7.6 g/dL (ref 6.0–8.5)
eGFR: 70 mL/min/{1.73_m2} (ref >59)

## 2021-02-05 LAB — CBC
Hematocrit: 45.6 % (ref 37.5–51.0)
Hemoglobin: 15.3 g/dL (ref 13.0–17.7)
MCH: 30.5 pg (ref 26.6–33.0)
MCHC: 33.6 g/dL (ref 31.5–35.7)
MCV: 91 fL (ref 79–97)
Platelets: 212 10*3/uL (ref 150–450)
RBC: 5.02 x10E6/uL (ref 4.14–5.80)
RDW: 12.6 % (ref 11.6–15.4)
WBC: 11.1 10*3/uL — ABNORMAL HIGH (ref 3.4–10.8)

## 2021-02-05 LAB — LIPID PANEL
Chol/HDL Ratio: 3.8 ratio (ref 0.0–5.0)
Cholesterol, Total: 137 mg/dL (ref 100–199)
HDL: 36 mg/dL — ABNORMAL LOW (ref >39)
LDL Chol Calc (NIH): 80 mg/dL (ref 0–99)
Triglycerides: 116 mg/dL (ref 0–149)
VLDL Cholesterol Cal: 21 mg/dL (ref 5–40)

## 2021-02-05 LAB — MICROALBUMIN / CREATININE URINE RATIO
Creatinine, Urine: 377.4 mg/dL
Microalb/Creat Ratio: 13 mg/g creat (ref 0–29)
Microalbumin, Urine: 48 ug/mL

## 2021-02-05 LAB — PSA: Prostate Specific Ag, Serum: 3.3 ng/mL (ref 0.0–4.0)

## 2021-02-21 ENCOUNTER — Other Ambulatory Visit: Payer: Self-pay

## 2021-02-25 ENCOUNTER — Other Ambulatory Visit: Payer: Self-pay

## 2021-02-25 ENCOUNTER — Ambulatory Visit: Payer: Self-pay | Attending: Internal Medicine

## 2021-02-25 DIAGNOSIS — Z23 Encounter for immunization: Secondary | ICD-10-CM

## 2021-02-25 NOTE — Progress Notes (Signed)
Pt arrived for 1st shingles vaccine. Vaccine was given in LD Pt tolerated shot well. Pt to return in 2 moths for 2nd dose.

## 2021-03-20 ENCOUNTER — Other Ambulatory Visit: Payer: Self-pay

## 2021-03-21 ENCOUNTER — Other Ambulatory Visit: Payer: Self-pay

## 2021-04-23 ENCOUNTER — Other Ambulatory Visit: Payer: Self-pay | Admitting: Physician Assistant

## 2021-04-23 ENCOUNTER — Other Ambulatory Visit: Payer: Self-pay

## 2021-04-23 DIAGNOSIS — I10 Essential (primary) hypertension: Secondary | ICD-10-CM

## 2021-04-23 MED ORDER — LISINOPRIL 40 MG PO TABS
ORAL_TABLET | Freq: Every day | ORAL | 6 refills | Status: DC
  Filled 2021-04-23 – 2021-05-28 (×2): qty 30, 30d supply, fill #0
  Filled 2021-06-21: qty 30, 30d supply, fill #1
  Filled 2021-07-23: qty 30, 30d supply, fill #2
  Filled 2021-08-21: qty 30, 30d supply, fill #3
  Filled 2021-09-25: qty 30, 30d supply, fill #4
  Filled 2021-10-28: qty 30, 30d supply, fill #5

## 2021-04-23 MED ORDER — AMLODIPINE BESYLATE 10 MG PO TABS
ORAL_TABLET | Freq: Every day | ORAL | 6 refills | Status: DC
Start: 2021-04-23 — End: 2021-07-29
  Filled 2021-04-23 – 2021-05-28 (×2): qty 30, 30d supply, fill #0
  Filled 2021-06-21: qty 30, 30d supply, fill #1
  Filled 2021-07-23: qty 90, 90d supply, fill #2

## 2021-04-23 NOTE — Telephone Encounter (Signed)
Requested Prescriptions  Pending Prescriptions Disp Refills   lisinopril (ZESTRIL) 40 MG tablet 30 tablet 6    Sig: TAKE 1 TABLET (40 MG TOTAL) BY MOUTH DAILY.     Cardiovascular:  ACE Inhibitors Passed - 04/23/2021  9:39 AM      Passed - Cr in normal range and within 180 days    Creatinine, Ser  Date Value Ref Range Status  02/04/2021 1.18 0.76 - 1.27 mg/dL Final   Creatinine, POC  Date Value Ref Range Status  10/31/2016 100 mg/dL Final         Passed - K in normal range and within 180 days    Potassium  Date Value Ref Range Status  02/04/2021 4.1 3.5 - 5.2 mmol/L Final         Passed - Patient is not pregnant      Passed - Last BP in normal range    BP Readings from Last 1 Encounters:  02/04/21 110/71         Passed - Valid encounter within last 6 months    Recent Outpatient Visits          2 months ago Type 2 diabetes mellitus with morbid obesity (HCC)   [facility], Gavin Pound B, MD   8 months ago Type 2 diabetes mellitus with morbid obesity [facility])   [place] [facility] [place], [place], [place]   1 year ago Type 2 diabetes mellitus with morbid obesity [facility])   [facility] Jonah Blue B, MD   1 year ago Type 2 diabetes mellitus with morbid obesity [facility])   [place] [facility] Jonah Blue B, MD   1 year ago Type 2 diabetes mellitus without complication, without long-term current use of insulin (HCC)   [facility] Marcine Matar, MD      Future Appointments            In 1 month Marcine Matar, MD [facility]            amLODipine (NORVASC) 10 MG tablet 30 tablet 6    Sig: TAKE 1 TABLET (10 MG TOTAL) BY MOUTH DAILY.     Cardiovascular:  Calcium Channel Blockers Passed - 04/23/2021  9:39 AM      Passed - Last BP in normal range    BP Readings from Last 1 Encounters:  02/04/21  110/71         Passed - Valid encounter within last 6 months    Recent Outpatient Visits          2 months ago Type 2 diabetes mellitus with morbid obesity (HCC)   [place] [facility] Jonah Blue B, MD   8 months ago Type 2 diabetes mellitus with morbid obesity [facility])   [facility] [place], [place], [place]   1 year ago Type 2 diabetes mellitus with morbid obesity [facility])   [place] [facility] Marcine Matar, MD   1 year ago Type 2 diabetes mellitus with morbid obesity [facility])   [place] [facility] Jonah Blue B, MD   1 year ago Type 2 diabetes mellitus without complication, without long-term current use of insulin [facility])   [place] Marcine Matar, MD      Future Appointments  In 1 month Marcine Matar, MD [facility]

## 2021-05-28 ENCOUNTER — Telehealth: Payer: Self-pay | Admitting: Internal Medicine

## 2021-05-28 ENCOUNTER — Other Ambulatory Visit: Payer: Self-pay

## 2021-05-28 ENCOUNTER — Ambulatory Visit: Payer: Self-pay | Attending: Internal Medicine

## 2021-05-28 NOTE — Telephone Encounter (Signed)
Will forward to provider  

## 2021-05-28 NOTE — Telephone Encounter (Signed)
Pt came in requesting to a referral to a specialist for external hemorrhoids he has had for X3 months, unsure if pt needs an appt for same  Please advise pt

## 2021-05-29 NOTE — Telephone Encounter (Signed)
Pt has an appt already scheduled for 2/3 at 210pm

## 2021-06-07 ENCOUNTER — Ambulatory Visit: Payer: Self-pay | Admitting: Internal Medicine

## 2021-06-21 ENCOUNTER — Other Ambulatory Visit: Payer: Self-pay

## 2021-07-19 ENCOUNTER — Telehealth: Payer: Self-pay | Admitting: Internal Medicine

## 2021-07-19 NOTE — Telephone Encounter (Signed)
Pt is calling to schedule a renewal appt for his financial assistance appt to [facility]- [phone] ?

## 2021-07-19 NOTE — Telephone Encounter (Signed)
Will direct message to [facility] for financial appointment.  ?

## 2021-07-23 ENCOUNTER — Telehealth: Payer: Self-pay | Admitting: Internal Medicine

## 2021-07-23 ENCOUNTER — Other Ambulatory Visit: Payer: Self-pay

## 2021-07-23 NOTE — Telephone Encounter (Signed)
I return Pt call, he will stop by to pick the application ?

## 2021-07-23 NOTE — Telephone Encounter (Signed)
Patient called in asking to speak to Mikle Bosworth states that this is his fourth call with no return call can be reached at Ph# [phone] ?

## 2021-07-25 ENCOUNTER — Other Ambulatory Visit: Payer: Self-pay

## 2021-07-29 ENCOUNTER — Other Ambulatory Visit: Payer: Self-pay

## 2021-07-29 ENCOUNTER — Encounter: Payer: Self-pay | Admitting: Internal Medicine

## 2021-07-29 ENCOUNTER — Ambulatory Visit: Payer: Self-pay | Attending: Internal Medicine | Admitting: Internal Medicine

## 2021-07-29 DIAGNOSIS — E785 Hyperlipidemia, unspecified: Secondary | ICD-10-CM

## 2021-07-29 DIAGNOSIS — E1159 Type 2 diabetes mellitus with other circulatory complications: Secondary | ICD-10-CM

## 2021-07-29 DIAGNOSIS — E1142 Type 2 diabetes mellitus with diabetic polyneuropathy: Secondary | ICD-10-CM

## 2021-07-29 DIAGNOSIS — E1169 Type 2 diabetes mellitus with other specified complication: Secondary | ICD-10-CM

## 2021-07-29 DIAGNOSIS — K648 Other hemorrhoids: Secondary | ICD-10-CM

## 2021-07-29 DIAGNOSIS — I152 Hypertension secondary to endocrine disorders: Secondary | ICD-10-CM

## 2021-07-29 LAB — POCT GLYCOSYLATED HEMOGLOBIN (HGB A1C): HbA1c, POC (controlled diabetic range): 6.7 % (ref 0.0–7.0)

## 2021-07-29 MED ORDER — AMLODIPINE BESYLATE 10 MG PO TABS
ORAL_TABLET | Freq: Every day | ORAL | 6 refills | Status: DC
  Filled 2021-07-29: qty 30, fill #0
  Filled 2021-10-28: qty 30, 30d supply, fill #0
  Filled 2021-11-28: qty 30, 30d supply, fill #1
  Filled 2022-01-07: qty 30, 30d supply, fill #2
  Filled 2022-02-11: qty 30, 30d supply, fill #3
  Filled 2022-03-11: qty 30, 30d supply, fill #4
  Filled 2022-04-14: qty 30, 30d supply, fill #5
  Filled 2022-05-26: qty 30, 30d supply, fill #6

## 2021-07-29 MED ORDER — METFORMIN HCL ER 500 MG PO TB24
500.0000 mg | ORAL_TABLET | Freq: Two times a day (BID) | ORAL | 6 refills | Status: DC
  Filled 2021-07-29: qty 60, 30d supply, fill #0
  Filled 2021-08-21: qty 60, 30d supply, fill #1
  Filled 2021-09-25: qty 60, 30d supply, fill #2
  Filled 2021-10-28: qty 60, 30d supply, fill #3
  Filled 2021-11-28: qty 60, 30d supply, fill #4
  Filled 2022-01-07: qty 60, 30d supply, fill #5
  Filled 2022-02-11: qty 60, 30d supply, fill #6

## 2021-07-29 MED ORDER — HYDROCHLOROTHIAZIDE 25 MG PO TABS
ORAL_TABLET | Freq: Every day | ORAL | 6 refills | Status: DC
  Filled 2021-07-29: qty 30, fill #0
  Filled 2021-08-21: qty 30, 30d supply, fill #0
  Filled 2021-09-25: qty 30, 30d supply, fill #1
  Filled 2021-10-28: qty 30, 30d supply, fill #2
  Filled 2021-11-28: qty 30, 30d supply, fill #3
  Filled 2022-01-07: qty 30, 30d supply, fill #4
  Filled 2022-02-11: qty 30, 30d supply, fill #5
  Filled 2022-03-11: qty 30, 30d supply, fill #6

## 2021-07-29 MED ORDER — HYDROCORTISONE ACETATE 25 MG RE SUPP
25.0000 mg | Freq: Two times a day (BID) | RECTAL | 0 refills | Status: DC
Start: 2021-07-29 — End: 2021-12-09
  Filled 2021-07-29: qty 12, 6d supply, fill #0

## 2021-07-29 NOTE — Progress Notes (Signed)
? ? ?Patient ID: Scott Mcmillan, male    DOB: [birth year]  MRN: [id number] ? ?CC: Diabetes and Hypertension ? ? ?Subjective: ?Scott Mcmillan is a 61 y.o. male who presents for chronic ds management ?His concerns today include:  ?Patient with history of HTN, HL, morbid obesity, DM. ?  ?DIABETES TYPE 2 ?Last A1C:   ?Results for orders placed or performed in visit on 07/29/21  ?POCT glycosylated hemoglobin (Hb A1C)  ?Result Value Ref Range  ? Hemoglobin A1C    ? HbA1c POC (<> result, manual entry)    ? HbA1c, POC (prediabetic range)    ? HbA1c, POC (controlled diabetic range) 6.7 0.0 - 7.0 %  ?  ?Med Adherence:  _0  Yes Metformin 500 mg BID ?Medication side effects:  _1  Yes has diarrhea intermittently ?Home Monitoring?  _2  Yes    _3  No ?Home glucose results range: ?Diet Adherence: eating less.  Started drinking Splenda DM Shake in a.m for breakfast.  Wgh down 15 lbs since last visit ?Exercise: not as much in past 3 mths due to discomfort from hemorrhoids ?Hypoglycemic episodes?: _4  Yes    _5  No ?Numbness of the feet? _6  Yes mainly of the toes.   _7  No ?Retinopathy hx? _8  Yes    _9  No ?Last eye exam:  07/23/2021 at [facility].  Dr. Truman Hayward.  Told he has mild cataracts but no diabetic retinopathy. ?Comments:  ? ?In regards to his blood pressure, he reports compliance with lisinopril and Norvasc.  He limits salt in the foods.  No chest pains or increased shortness of breath. ? ?HL: Reports compliance with and tolerating Pravachol. ? ? ?Has prolapsing internal hemorrhoid x 3 mths.  Prolapses every time he has a BM.  No blood in stool but spots on toilet paper when he wipes. Hemorrhoid swells.   ?Denies constipation.  Takes Colace 100 mg 2-4 a day ?Had c-scope 06/2017 that revealed internal hemorrhoids. Had ruber band ligation x 3 a mth apart 2019 by Dr. Hilarie Fredrickson.   ?Mailed out completed [place] form for renewal this past wkend ?Patient Active Problem List  ? Diagnosis Date Noted  ? Hyperlipidemia associated with type 2 diabetes  mellitus ([place]) 11/08/2019  ? Influenza vaccine needed 01/13/2019  ? Non-recurrent bilateral inguinal hernia without obstruction or gangrene 04/23/2018  ? Hemorrhoids 01/22/2018  ? Actinic keratosis due to exposure to sunlight 07/21/2017  ? Lower extremity edema 07/21/2017  ? Class 3 severe obesity due to excess calories without serious comorbidity with body mass index (BMI) of 50.0 to 59.9 in adult [facility]) 07/21/2017  ? Hyperlipidemia with target low density lipoprotein (LDL) cholesterol less than 100 mg/dL 11/17/2016  ? Hypertension 10/31/2016  ?  ? ?Current Outpatient Medications on File Prior to Visit  ?Medication Sig Dispense Refill  ? amLODipine (NORVASC) 10 MG tablet TAKE 1 TABLET (10 MG TOTAL) BY MOUTH DAILY. 30 tablet 6  ? aspirin EC 81 MG tablet Take 1 tablet (81 mg total) by mouth daily. 90 tablet 3  ? Blood Glucose Monitoring Suppl (TRUE METRIX METER) w/Device KIT Use as directed 1 kit 0  ? carvedilol (COREG) 12.5 MG tablet Take 1 tablet (12.5 mg total) by mouth 2 (two) times daily with a meal. 60 tablet 6  ? glucose blood (TRUE METRIX BLOOD GLUCOSE TEST) test strip Use as instructed 100 each 12  ? hydrochlorothiazide (HYDRODIURIL) 25 MG tablet TAKE 1 TABLET (25 MG TOTAL) BY MOUTH DAILY. 30 tablet 6  ? lisinopril (ZESTRIL) 40 MG tablet TAKE  1 TABLET (40 MG TOTAL) BY MOUTH DAILY. 30 tablet 6  ? metFORMIN (GLUCOPHAGE) 500 MG tablet TAKE 1 TABLET (500 MG TOTAL) BY MOUTH 2 (TWO) TIMES DAILY WITH A MEAL. 60 tablet 6  ? pravastatin (PRAVACHOL) 40 MG tablet Take 1 tablet (40 mg total) by mouth daily. 30 tablet 6  ? ?No current facility-administered medications on file prior to visit.  ? ? ?No Known Allergies ? ?Social History  ? ?Socioeconomic History  ? Marital status: Single  ?  Spouse name: Not on file  ? Number of children: Not on file  ? Years of education: Not on file  ? Highest education level: Not on file  ?Occupational History  ? Not on file  ?Tobacco Use  ? Smoking status: Never  ? Smokeless tobacco:  Never  ?Vaping Use  ? Vaping Use: Never used  ?Substance and Sexual Activity  ? Alcohol use: No  ? Drug use: No  ? Sexual activity: Not on file  ?Other Topics Concern  ? Not on file  ?Social History Narrative  ? Not on file  ? ?Social Determinants of Health  ? ?Financial Resource Strain: Not on file  ?Food Insecurity: Not on file  ?Transportation Needs: Not on file  ?Physical Activity: Not on file  ?Stress: Not on file  ?Social Connections: Not on file  ?Intimate Partner Violence: Not on file  ? ? ?Family History  ?Problem Relation Age of Onset  ? Colon cancer Neg Hx   ? Esophageal cancer Neg Hx   ? Liver cancer Neg Hx   ? Pancreatic cancer Neg Hx   ? Rectal cancer Neg Hx   ? Stomach cancer Neg Hx   ? ? ?No past surgical history on file. ? ?ROS: ?Review of Systems ?Negative except as stated above ? ?PHYSICAL EXAM: ?BP 121/79   Pulse 75   Resp 16   Wt 292 lb 9.6 oz (132.7 kg)   SpO2 95%   BMI 45.83 kg/m?   ?Wt Readings from Last 3 Encounters:  ?07/29/21 292 lb 9.6 oz (132.7 kg)  ?02/04/21 (!) 307 lb 9.6 oz (139.5 kg)  ?03/12/20 (!) 314 lb (142.4 kg)  ? ? ?Physical Exam ? ? ?General appearance - alert, well appearing, and in no distress ?Mental status - normal mood, behavior, speech, dress, motor activity, and thought processes ?Neck - supple, no significant adenopathy ?Chest - clear to auscultation, no wheezes, rales or rhonchi, symmetric air entry ?Heart - normal rate, regular rhythm, normal S1, S2, no murmurs, rubs, clicks or gallops ?Rectal -CMA Sallyanne Havers present: No external hemorrhoids noted.  Small hemorrhoid noted partially to prolapse out of the rectum between the 3 and 7 o'clock position. ?Extremities -trace lower extremity edema. ?Diabetic Foot Exam - Simple   ?Simple Foot Form ?Diabetic Foot exam was performed with the following findings: Yes 07/29/2021 12:27 PM  ?Visual Inspection ?No deformities, no ulcerations, no other skin breakdown bilaterally: Yes ?Sensation Testing ?See comments: Yes ?Pulse  Check ?Posterior Tibialis and Dorsalis pulse intact bilaterally: Yes ?Comments ?Decrease sensation on leap exam on the plantar surface of both feet. ?  ? ? ? ?  Latest Ref Rng & Units 02/04/2021  ?  2:59 PM 11/11/2019  ?  3:06 PM 10/12/2018  ?  8:52 AM  ?CMP  ?Glucose 70 - 99 mg/dL 118   96   109    ?BUN 8 - 27 mg/dL _0 ?Creatinine 0.76 - 1.27 mg/dL  1.18   0.98   1.15    ?Sodium 134 - 144 mmol/L 140   140   140    ?Potassium 3.5 - 5.2 mmol/L 4.1   3.9   3.7    ?Chloride 96 - 106 mmol/L 94   101   97    ?CO2 20 - 29 mmol/L _0 ?Calcium 8.6 - 10.2 mg/dL 9.6   9.5   9.7    ?Total Protein 6.0 - 8.5 g/dL 7.6   7.3   6.8    ?Total Bilirubin 0.0 - 1.2 mg/dL 0.7   0.4   0.5    ?Alkaline Phos 44 - 121 IU/L 43   46   39    ?AST 0 - 40 IU/L _1 ?ALT 0 - 44 IU/L _2 ? ?Lipid Panel  ?   ?Component Value Date/Time  ? CHOL 137 02/04/2021 1459  ? TRIG 116 02/04/2021 1459  ? HDL 36 (L) 02/04/2021 1459  ? CHOLHDL 3.8 02/04/2021 1459  ? [place] 80 02/04/2021 1459  ? ? ?CBC ?   ?Component Value Date/Time  ? WBC 11.1 (H) 02/04/2021 1459  ? WBC 12.1 (H) 09/23/2016 0953  ? RBC 5.02 02/04/2021 1459  ? RBC 4.66 09/23/2016 0953  ? HGB 15.3 02/04/2021 1459  ? HCT 45.6 02/04/2021 1459  ? PLT 212 02/04/2021 1459  ? MCV 91 02/04/2021 1459  ? MCH 30.5 02/04/2021 1459  ? MCH 29.2 09/23/2016 0953  ? MCHC 33.6 02/04/2021 1459  ? MCHC 32.1 09/23/2016 0953  ? RDW 12.6 02/04/2021 1459  ? ? ?ASSESSMENT AND PLAN: ? ?1. Type 2 diabetes mellitus with morbid obesity ([place]) ?At goal.  Commended him on wgh loss. Continue trying to cut back on portions ?Trail of Metformin XR to see if he tolerates better. ?- POCT glycosylated hemoglobin (Hb A1C) ?- metFORMIN (GLUCOPHAGE-XR) 500 MG 24 hr tablet; Take 1 tablet (500 mg total) by mouth 2 (two) times daily.  Dispense: 60 tablet; Refill: 6 ? ?2. Diabetic peripheral neuropathy ([place]) ?Good DM foot care encouraged ? ?3. Hypertension associated with diabetes ([place]) ?At goal.   Continue current meds ?- hydrochlorothiazide (HYDRODIURIL) 25 MG tablet; TAKE 1 TABLET (25 MG TOTAL) BY MOUTH DAILY.  Dispense: 30 tablet; Refill: 6 ?- amLODipine (NORVASC) 10 MG tablet; TAKE 1 TABLET (10 MG TOTAL) BY

## 2021-07-30 ENCOUNTER — Encounter: Payer: Self-pay | Admitting: Internal Medicine

## 2021-08-21 ENCOUNTER — Other Ambulatory Visit: Payer: Self-pay

## 2021-09-16 ENCOUNTER — Encounter: Payer: Self-pay | Admitting: *Deleted

## 2021-09-17 ENCOUNTER — Encounter: Payer: Self-pay | Admitting: Internal Medicine

## 2021-09-17 ENCOUNTER — Ambulatory Visit (INDEPENDENT_AMBULATORY_CARE_PROVIDER_SITE_OTHER): Payer: Self-pay | Admitting: Internal Medicine

## 2021-09-17 ENCOUNTER — Other Ambulatory Visit: Payer: Self-pay

## 2021-09-17 VITALS — BP 122/72 | HR 77 | Ht 67.0 in | Wt 289.0 lb

## 2021-09-17 DIAGNOSIS — R195 Other fecal abnormalities: Secondary | ICD-10-CM

## 2021-09-17 DIAGNOSIS — K648 Other hemorrhoids: Secondary | ICD-10-CM

## 2021-09-17 DIAGNOSIS — K611 Rectal abscess: Secondary | ICD-10-CM

## 2021-09-17 MED ORDER — CIPROFLOXACIN HCL 500 MG PO TABS
500.0000 mg | ORAL_TABLET | Freq: Two times a day (BID) | ORAL | 0 refills | Status: DC
  Filled 2021-09-17: qty 20, 10d supply, fill #0

## 2021-09-17 MED ORDER — METRONIDAZOLE 250 MG PO TABS
250.0000 mg | ORAL_TABLET | Freq: Three times a day (TID) | ORAL | 0 refills | Status: DC
  Filled 2021-09-17: qty 30, 10d supply, fill #0

## 2021-09-17 NOTE — Progress Notes (Signed)
Patient ID: Scott Mcmillan, male   DOB: [birth year], 61 y.o.   MRN: [id number] ?HPI: ?[name] is a 61 year old male with a history of symptomatic internal hemorrhoids status post hemorrhoidal banding in 2019, history of anal fissure, ileocecal valve lipoma, hypertension, hyperlipidemia, obesity who is here to discuss perianal swelling and pain.  He is here alone today. ? ?He reports that since his hemorrhoidal banding which worked very well for him he has been on stool softener with Colace 200 to 400 mg a day.  He has not had issues with constipation unless he eats a diet high in breads and meats.  Rice also seems to make him more constipated.  He was having issues occasional loose stools related to metformin but since changing to the extended release version this has not been a problem. ? ?Over the last 2 months he has been dealing with perianal pain.  At times this was severe.  He felt a bulge in the right perianal tissue that even extended towards the right buttock.  It made it hard to sit and at times was excruciating.  He felt like this was a hemorrhoid.  He used Anusol suppository though found this difficult to keep inserted.  He had some drainage from this area and it seems to be smaller of late but not completely resolved. ? ?Past Medical History:  ?Diagnosis Date  ? Bilateral inguinal hernia   ? Diabetes ([place])   ? Hemorrhoids   ? Hyperlipidemia   ? Hypertension   ? Internal hemorrhoids   ? ? ?History reviewed. No pertinent surgical history. ? ?Outpatient Medications Prior to Visit  ?Medication Sig Dispense Refill  ? amLODipine (NORVASC) 10 MG tablet TAKE 1 TABLET (10 MG TOTAL) BY MOUTH DAILY. 30 tablet 6  ? aspirin EC 81 MG tablet Take 1 tablet (81 mg total) by mouth daily. 90 tablet 3  ? Blood Glucose Monitoring Suppl (TRUE METRIX METER) w/Device KIT Use as directed 1 kit 0  ? carvedilol (COREG) 12.5 MG tablet Take 1 tablet (12.5 mg total) by mouth 2 (two) times daily with a meal. 60 tablet 6  ? Docusate  Sodium (STOOL SOFTENER LAXATIVE PO) Take 100 mg by mouth. Takes 2 to 4 capsules daily as needed.    ? glucose blood (TRUE METRIX BLOOD GLUCOSE TEST) test strip Use as instructed 100 each 12  ? hydrochlorothiazide (HYDRODIURIL) 25 MG tablet TAKE 1 TABLET (25 MG TOTAL) BY MOUTH DAILY. 30 tablet 6  ? hydrocortisone (ANUSOL-HC) 25 MG suppository Place 1 suppository (25 mg total) rectally 2 (two) times daily. 12 suppository 0  ? lisinopril (ZESTRIL) 40 MG tablet TAKE 1 TABLET (40 MG TOTAL) BY MOUTH DAILY. 30 tablet 6  ? metFORMIN (GLUCOPHAGE-XR) 500 MG 24 hr tablet Take 1 tablet (500 mg total) by mouth 2 (two) times daily. 60 tablet 6  ? pravastatin (PRAVACHOL) 40 MG tablet Take 1 tablet (40 mg total) by mouth daily. 30 tablet 6  ? ?No facility-administered medications prior to visit.  ? ? ?No Known Allergies ? ?Family History  ?Problem Relation Age of Onset  ? Heart disease Mother   ? Lung cancer Father   ? Colon cancer Neg Hx   ? Esophageal cancer Neg Hx   ? Liver cancer Neg Hx   ? Pancreatic cancer Neg Hx   ? Rectal cancer Neg Hx   ? Stomach cancer Neg Hx   ? ? ?Social History  ? ?Tobacco Use  ? Smoking status: Never  ? Smokeless  tobacco: Never  ?Vaping Use  ? Vaping Use: Never used  ?Substance Use Topics  ? Alcohol use: No  ? Drug use: No  ? ? ?ROS: ?As per history of present illness, otherwise negative ? ?BP 122/72   Pulse 77   Ht _0  (1.702 m)   Wt 289 lb (131.1 kg)   SpO2 98%   BMI 45.26 kg/m?  ?Gen: awake, alert, NAD ?HEENT: anicteric  ?Abd: soft, obese, NT/ND, +BS throughout ?Rectal: Right perianal firmness and nodule with skin opening/fistula likely improving perirectal abscess, small slightly prolapsed internal versus immediately external hemorrhoid which are nontender, no fissure palpable, no masses ?Ext: no c/c/e ?Neuro: nonfocal ? ? ?RELEVANT LABS AND IMAGING: ?CBC ?   ?Component Value Date/Time  ? WBC 11.1 (H) 02/04/2021 1459  ? WBC 12.1 (H) 09/23/2016 0953  ? RBC 5.02 02/04/2021 1459  ? RBC 4.66  09/23/2016 0953  ? HGB 15.3 02/04/2021 1459  ? HCT 45.6 02/04/2021 1459  ? PLT 212 02/04/2021 1459  ? MCV 91 02/04/2021 1459  ? MCH 30.5 02/04/2021 1459  ? MCH 29.2 09/23/2016 0953  ? MCHC 33.6 02/04/2021 1459  ? MCHC 32.1 09/23/2016 0953  ? RDW 12.6 02/04/2021 1459  ? ? ?CMP  ?   ?Component Value Date/Time  ? NA 140 02/04/2021 1459  ? K 4.1 02/04/2021 1459  ? CL 94 (L) 02/04/2021 1459  ? CO2 26 02/04/2021 1459  ? GLUCOSE 118 (H) 02/04/2021 1459  ? GLUCOSE 150 (H) 09/23/2016 0953  ? BUN 21 02/04/2021 1459  ? CREATININE 1.18 02/04/2021 1459  ? CALCIUM 9.6 02/04/2021 1459  ? PROT 7.6 02/04/2021 1459  ? ALBUMIN 4.3 02/04/2021 1459  ? AST 22 02/04/2021 1459  ? ALT 26 02/04/2021 1459  ? ALKPHOS 43 (L) 02/04/2021 1459  ? BILITOT 0.7 02/04/2021 1459  ? GFRNONAA 83 11/11/2019 1506  ? GFRAA 96 11/11/2019 1506  ? ? ?ASSESSMENT/PLAN: ? 61 year old male with a history of symptomatic internal hemorrhoids status post hemorrhoidal banding in 2019, history of anal fissure, ileocecal valve lipoma, hypertension, hyperlipidemia, obesity who is here to discuss perianal swelling and pain. ? ?Perirectal abscess --I discussed how the perianal swelling and pain was related to an abscess which is visible on exam today.  This has drained itself spontaneously and seems to be improving.  We discussed the difference tween this process and thrombosed or prolapsed hemorrhoids.  I recommended the following: ?--Cipro 500 mg twice daily and metronidazole 250 mg 3 times daily x10 days ?--RectiCare over-the-counter as needed for pain ?--Okay for sitz bath's as needed ?--Notify me if this fails to improve and completely resolve ? ?2.  Internal hemorrhoids --previously treated with good response.  Not the predominant issue currently. ? ?3.  Mild constipation/hard stools--continue Colace 200 to 400 mg daily and liberal water intake ? ?4.  Colon cancer screening --full colonoscopy in 2019, repeat would be in 10 years ? ?Follow-up as  needed ? ? ?OI:[id number], Dalbert Batman, Md ?[address] ?[address] ?[place],  [place] [id number] ? ? ?

## 2021-09-17 NOTE — Patient Instructions (Addendum)
We have sent the following medications to your pharmacy for you to pick up at your convenience: ?Cipro 500 mg twice daily x 10 days ?Flagyl 250 mg three times daily x 10 days. ? ?Please continue using stool softeners. ? ?Let us know if your symptoms do not improve after the above measures. ? ?If you are age 63 or older, your body mass index should be between 23-30. Your Body mass index is 45.26 kg/m?Marland Kitchen If this is out of the aforementioned range listed, please consider follow up with your Primary Care Provider. ? ?If you are age 20 or younger, your body mass index should be between 19-25. Your Body mass index is 45.26 kg/m?Marland Kitchen If this is out of the aformentioned range listed, please consider follow up with your Primary Care Provider.  ? ?_____________________________________________________ ?The [place] [facility] providers would like to encourage you to use [facility] to communicate with providers for non-urgent requests or questions.  Due to long hold times on the telephone, sending your provider a message by [facility] may be a faster and more efficient way to get a response.  Please allow 48 business hours for a response.  Please remember that this is for non-urgent requests.  ?_____________________________________________________ ? ?Due to recent changes in healthcare laws, you may see the results of your imaging and laboratory studies on MyChart before your provider has had a chance to review them.  We understand that in some cases there may be results that are confusing or concerning to you. Not all laboratory results come back in the same time frame and the provider may be waiting for multiple results in order to interpret others.  Please give [place] 48 hours in order for your provider to thoroughly review all the results before contacting the office for clarification of your results.  ? ?

## 2021-09-18 ENCOUNTER — Other Ambulatory Visit: Payer: Self-pay

## 2021-09-25 ENCOUNTER — Other Ambulatory Visit: Payer: Self-pay | Admitting: Internal Medicine

## 2021-09-25 ENCOUNTER — Other Ambulatory Visit: Payer: Self-pay

## 2021-09-25 DIAGNOSIS — E785 Hyperlipidemia, unspecified: Secondary | ICD-10-CM

## 2021-09-25 MED ORDER — PRAVASTATIN SODIUM 40 MG PO TABS
40.0000 mg | ORAL_TABLET | Freq: Every day | ORAL | 6 refills | Status: DC
  Filled 2021-09-25: qty 30, 30d supply, fill #0
  Filled 2021-10-28: qty 30, 30d supply, fill #1
  Filled 2021-11-28: qty 30, 30d supply, fill #2
  Filled 2022-01-07: qty 30, 30d supply, fill #3
  Filled 2022-02-11 (×2): qty 30, 30d supply, fill #4
  Filled 2022-03-11: qty 30, 30d supply, fill #5
  Filled 2022-04-14: qty 30, 30d supply, fill #6

## 2021-09-26 ENCOUNTER — Other Ambulatory Visit: Payer: Self-pay

## 2021-10-02 ENCOUNTER — Telehealth: Payer: Self-pay | Admitting: Internal Medicine

## 2021-10-02 NOTE — Telephone Encounter (Signed)
Triagey.... 

## 2021-10-02 NOTE — Telephone Encounter (Signed)
Inbound call from patient stating he has been experiencing extreme pain when having a bowl movement. Patient states he has also ran out of medication '' Cirofloxacin'' and is requesting to see if he can have the medication refilled. Please give patient a call back to advise.  Thank You.

## 2021-10-02 NOTE — Telephone Encounter (Signed)
Cipro 500 mg BID and metronidazole 250 mg TID x 10 days [facility] referral for urgent eval of perirectal abscess JMP

## 2021-10-02 NOTE — Telephone Encounter (Signed)
Pt having a lot of pain when he has a bowel movement. Abscess got better but a few days before med ran out he started getting worse. Saturday started hurting quite bad. Has lot of burning sensation like a fissure. Inner buttock feels real warm to the touch and he thinks that is where the abscess was. States he is worse now than when he was here before. He was treated with cipro.Please advise.

## 2021-10-03 ENCOUNTER — Other Ambulatory Visit: Payer: Self-pay

## 2021-10-03 MED ORDER — CIPROFLOXACIN HCL 500 MG PO TABS
500.0000 mg | ORAL_TABLET | Freq: Two times a day (BID) | ORAL | 0 refills | Status: DC
  Filled 2021-10-03: qty 20, 10d supply, fill #0

## 2021-10-03 MED ORDER — METRONIDAZOLE 250 MG PO TABS
250.0000 mg | ORAL_TABLET | Freq: Three times a day (TID) | ORAL | 0 refills | Status: DC
  Filled 2021-10-03: qty 30, 10d supply, fill #0

## 2021-10-03 NOTE — Telephone Encounter (Signed)
Spoke with pt and he is aware of Dr. Lauro Franklin recommendations. Prescriptions were sent to pharmacy. Referral faxed to [facility].

## 2021-10-07 ENCOUNTER — Telehealth: Payer: Self-pay | Admitting: Internal Medicine

## 2021-10-07 DIAGNOSIS — K611 Rectal abscess: Secondary | ICD-10-CM

## 2021-10-07 NOTE — Telephone Encounter (Signed)
Copied from CRM [id number]. Topic: Referral - Question >> Oct 07, 2021  2:49 PM Donnelly Angelica R wrote: Reason for CRM: Pt states Dr. Rhea Belton from [facility] referred him to [facility] [place] [facility] but due to [place] [facility] being out of [facility] network, a referral from provider is needed. Please send to [facility] ([employer]) at [employer]

## 2021-10-28 ENCOUNTER — Other Ambulatory Visit: Payer: Self-pay

## 2021-11-18 ENCOUNTER — Ambulatory Visit: Payer: Self-pay | Admitting: General Surgery

## 2021-11-18 NOTE — H&P (View-Only) (Signed)
REFERRING PHYSICIAN:  Pyrtle, Jay Malone, MD  PROVIDER:  Amyre Segundo CHRISTINE Latorsha Curling, MD  MRN: [id number] DOB: 10/03/1960 DATE OF ENCOUNTER: 11/18/2021  Subjective   Chief Complaint: Abscess     History of Present Illness: Scott Mcmillan is a 61 y.o. male who is seen today as an office consultation at the request of Dr. Pyrtle for evaluation of Abscess .  Patient with a history of internal hemorrhoids, anal fissure who presents to the office today for evaluation of an anal abscess.  He states that over the last 2 months he has been dealing with perianal pain which can be severe.  He felt a bulge in the right perianal tissue which made it hard to sit.  He also has some drainage from the area.  He saw Dr. Pirtle and was felt to have a perirectal abscess.  This had drained spontaneously and was improving.  He was placed on antibiotics.  He is now approximately 2 months out from this treatment and still continues to have recurrent pain and drainage.  He reports regular bowel habits with occasional constipation and diarrhea.   Review of Systems: A complete review of systems was obtained from the patient.  I have reviewed this information and discussed as appropriate with the patient.  See HPI as well for other ROS.    Medical History: Past Medical History:  Diagnosis Date   Diabetes mellitus without complication (CMS-HCC)    Hyperlipidemia    Hypertension     There is no problem list on file for this patient.   No past surgical history on file.   No Known Allergies  Current Outpatient Medications on File Prior to Visit  Medication Sig Dispense Refill   amLODIPine (NORVASC) 10 MG tablet Take 1 tablet by mouth once daily     carvediloL (COREG) 12.5 MG tablet Take by mouth     hydroCHLOROthiazide (HYDRODIURIL) 25 MG tablet Take 1 tablet by mouth once daily     lisinopriL (ZESTRIL) 40 MG tablet Take 40 mg by mouth once daily     metFORMIN (GLUCOPHAGE-XR) 500 MG XR tablet Take 500 mg by  mouth 2 (two) times daily     pravastatin (PRAVACHOL) 40 MG tablet Take 40 mg by mouth once daily     No current facility-administered medications on file prior to visit.    Family History  Problem Relation Age of Onset   Diabetes Brother      Social History   Tobacco Use  Smoking Status Never  Smokeless Tobacco Not on file     Social History   Socioeconomic History   Marital status: Single  Tobacco Use   Smoking status: Never  Substance and Sexual Activity   Alcohol use: Not Currently   Drug use: Never    Objective:    Vitals:   11/18/21 1039  BP: 130/80  Pulse: 93  Temp: 36.8 C (98.2 F)  SpO2: 94%  Weight: (!) 134.3 kg (296 lb)  Height: 170.2 cm (5' 7")     Exam Gen: NAD CV: RRR Lungs: CTA Abd: soft  Rectal: R lateral ext opening   Labs, Imaging and Diagnostic Testing:  Assessment and Plan:  Diagnoses and all orders for this visit:  Anal fistula     61-year-old male who presents to the office with a perianal fistula.  We have discussed the treatment of this in detail.  I have recommended exam under anesthesia with possible fistulotomy versus seton placement.  If fistulotomy is performed, there is   a small risk of incontinence.  This should heal in 2 to 3 weeks.  If seton is placed this is due to too much sphincter complex involvement and will require a second surgery in approximately 3 months with a recurrence rate of approximately 20%.  We have discussed this in detail.  All questions were answered.  Patient would like to proceed with surgery.  Vanita Panda, MD Colon and Rectal Surgery [facility]   No follow-ups on file.

## 2021-11-18 NOTE — H&P (Signed)
REFERRING PHYSICIAN:  Pyrtle, Burnard Leigh, MD  PROVIDER:  Elenora Gamma, MD  MRN: [id number] DOB: [birth year] DATE OF ENCOUNTER: 11/18/2021  Subjective   Chief Complaint: Abscess     History of Present Illness: Aadil Sur is a 61 y.o. male who is seen today as an office consultation at the request of Dr. Rhea Belton for evaluation of Abscess .  Patient with a history of internal hemorrhoids, anal fissure who presents to the office today for evaluation of an anal abscess.  He states that over the last 2 months he has been dealing with perianal pain which can be severe.  He felt a bulge in the right perianal tissue which made it hard to sit.  He also has some drainage from the area.  He saw Dr. Sharla Kidney and was felt to have a perirectal abscess.  This had drained spontaneously and was improving.  He was placed on antibiotics.  He is now approximately 2 months out from this treatment and still continues to have recurrent pain and drainage.  He reports regular bowel habits with occasional constipation and diarrhea.   Review of Systems: A complete review of systems was obtained from the patient.  I have reviewed this information and discussed as appropriate with the patient.  See HPI as well for other ROS.    Medical History: Past Medical History:  Diagnosis Date   Diabetes mellitus without complication (CMS-HCC)    Hyperlipidemia    Hypertension     There is no problem list on file for this patient.   No past surgical history on file.   No Known Allergies  Current Outpatient Medications on File Prior to Visit  Medication Sig Dispense Refill   amLODIPine (NORVASC) 10 MG tablet Take 1 tablet by mouth once daily     carvediloL (COREG) 12.5 MG tablet Take by mouth     hydroCHLOROthiazide (HYDRODIURIL) 25 MG tablet Take 1 tablet by mouth once daily     lisinopriL (ZESTRIL) 40 MG tablet Take 40 mg by mouth once daily     metFORMIN (GLUCOPHAGE-XR) 500 MG XR tablet Take 500 mg by  mouth 2 (two) times daily     pravastatin (PRAVACHOL) 40 MG tablet Take 40 mg by mouth once daily     No current facility-administered medications on file prior to visit.    Family History  Problem Relation Age of Onset   Diabetes Brother      Social History   Tobacco Use  Smoking Status Never  Smokeless Tobacco Not on file     Social History   Socioeconomic History   Marital status: Single  Tobacco Use   Smoking status: Never  Substance and Sexual Activity   Alcohol use: Not Currently   Drug use: Never    Objective:    Vitals:   11/18/21 1039  BP: 130/80  Pulse: 93  Temp: 36.8 C (98.2 F)  SpO2: 94%  Weight: (!) 134.3 kg (296 lb)  Height: 170.2 cm (5\' 7" )     Exam Gen: NAD CV: RRR Lungs: CTA Abd: soft  Rectal: R lateral ext opening   Labs, Imaging and Diagnostic Testing:  Assessment and Plan:  Diagnoses and all orders for this visit:  Anal fistula     61 year old male who presents to the office with a perianal fistula.  We have discussed the treatment of this in detail.  I have recommended exam under anesthesia with possible fistulotomy versus seton placement.  If fistulotomy is performed, there is  a small risk of incontinence.  This should heal in 2 to 3 weeks.  If seton is placed this is due to too much sphincter complex involvement and will require a second surgery in approximately 3 months with a recurrence rate of approximately 20%.  We have discussed this in detail.  All questions were answered.  Patient would like to proceed with surgery.  Vanita Panda, MD Colon and Rectal Surgery [facility]   No follow-ups on file.

## 2021-11-28 ENCOUNTER — Ambulatory Visit: Payer: Self-pay | Attending: Internal Medicine | Admitting: Internal Medicine

## 2021-11-28 ENCOUNTER — Encounter: Payer: Self-pay | Admitting: Internal Medicine

## 2021-11-28 ENCOUNTER — Other Ambulatory Visit: Payer: Self-pay

## 2021-11-28 ENCOUNTER — Other Ambulatory Visit: Payer: Self-pay | Admitting: Internal Medicine

## 2021-11-28 DIAGNOSIS — E1169 Type 2 diabetes mellitus with other specified complication: Secondary | ICD-10-CM

## 2021-11-28 DIAGNOSIS — I10 Essential (primary) hypertension: Secondary | ICD-10-CM

## 2021-11-28 DIAGNOSIS — E1159 Type 2 diabetes mellitus with other circulatory complications: Secondary | ICD-10-CM

## 2021-11-28 DIAGNOSIS — K603 Anal fistula: Secondary | ICD-10-CM

## 2021-11-28 DIAGNOSIS — I152 Hypertension secondary to endocrine disorders: Secondary | ICD-10-CM

## 2021-11-28 DIAGNOSIS — E785 Hyperlipidemia, unspecified: Secondary | ICD-10-CM

## 2021-11-28 LAB — POCT GLYCOSYLATED HEMOGLOBIN (HGB A1C): HbA1c, POC (controlled diabetic range): 6.5 % (ref 0.0–7.0)

## 2021-11-28 LAB — GLUCOSE, POCT (MANUAL RESULT ENTRY): POC Glucose: 132 mg/dl — AB (ref 70–99)

## 2021-11-28 MED ORDER — CARVEDILOL 12.5 MG PO TABS
12.5000 mg | ORAL_TABLET | Freq: Two times a day (BID) | ORAL | 6 refills | Status: DC
  Filled 2021-11-28: qty 60, 30d supply, fill #0
  Filled 2022-01-07: qty 60, 30d supply, fill #1
  Filled 2022-02-11 (×2): qty 60, 30d supply, fill #2
  Filled 2022-03-31: qty 60, 30d supply, fill #3
  Filled 2022-05-26: qty 60, 30d supply, fill #4
  Filled 2022-06-27: qty 60, 30d supply, fill #5
  Filled 2022-08-15: qty 60, 30d supply, fill #6

## 2021-11-28 MED ORDER — LISINOPRIL 40 MG PO TABS
ORAL_TABLET | Freq: Every day | ORAL | 6 refills | Status: DC
  Filled 2021-11-28: qty 30, 30d supply, fill #0
  Filled 2022-01-07: qty 30, 30d supply, fill #1
  Filled 2022-02-11: qty 30, 30d supply, fill #2
  Filled 2022-03-11: qty 30, 30d supply, fill #3
  Filled 2022-04-14 (×2): qty 30, 30d supply, fill #4
  Filled 2022-05-26: qty 30, 30d supply, fill #5
  Filled 2022-06-27 (×2): qty 30, 30d supply, fill #6

## 2021-11-28 NOTE — Progress Notes (Signed)
Patient ID: Scott Mcmillan, male    DOB: 1961-03-30  MRN: [id number]  CC: Diabetes   Subjective: Scott Mcmillan is a 61 y.o. male who presents for chronic ds management His concerns today include:  Patient with history of HTN, HL, morbid obesity, DM.  Saw general surgeon and GI since last visit with me. Dx with anal fistula.  Plan is for repair 2 wks from today. Never had surgery before He denies CP/SOB/LE edema  DM/Obesity: A1C 6.5 Walking 1/2-1 mile twice a wk.  No CP with exercise Not eating as well as he should; over ate when brother was visiting Taking and tolerating Metformin 500 mg BID He has not checked BS in past 3 wks  HTN: reports compliance with Lisinopril 40 mg, Coreg 12.5 mg BID, HCTZ and Norvasc 10 mg He limits salt in the foods Mild intermittent swelling in LT LE.  HL: taking and tolerating Pravachol    Patient Active Problem List   Diagnosis Date Noted   Hyperlipidemia associated with type 2 diabetes mellitus ([place]) 11/08/2019   Influenza vaccine needed 01/13/2019   Non-recurrent bilateral inguinal hernia without obstruction or gangrene 04/23/2018   Hemorrhoids 01/22/2018   Actinic keratosis due to exposure to sunlight 07/21/2017   Lower extremity edema 07/21/2017   Class 3 severe obesity due to excess calories without serious comorbidity with body mass index (BMI) of 50.0 to 59.9 in adult ([place]) 07/21/2017   Hyperlipidemia with target low density lipoprotein (LDL) cholesterol less than 100 mg/dL 11/17/2016   Hypertension 10/31/2016     Current Outpatient Medications on File Prior to Visit  Medication Sig Dispense Refill   amLODipine (NORVASC) 10 MG tablet TAKE 1 TABLET (10 MG TOTAL) BY MOUTH DAILY. 30 tablet 6   aspirin EC 81 MG tablet Take 1 tablet (81 mg total) by mouth daily. 90 tablet 3   Blood Glucose Monitoring Suppl (TRUE METRIX METER) w/Device KIT Use as directed 1 kit 0   carvedilol (COREG) 12.5 MG tablet Take 1 tablet (12.5 mg total) by  mouth 2 (two) times daily with a meal. 60 tablet 6   glucose blood (TRUE METRIX BLOOD GLUCOSE TEST) test strip Use as instructed 100 each 12   hydrochlorothiazide (HYDRODIURIL) 25 MG tablet TAKE 1 TABLET (25 MG TOTAL) BY MOUTH DAILY. 30 tablet 6   hydrocortisone (ANUSOL-HC) 25 MG suppository Place 1 suppository (25 mg total) rectally 2 (two) times daily. 12 suppository 0   lisinopril (ZESTRIL) 40 MG tablet TAKE 1 TABLET (40 MG TOTAL) BY MOUTH DAILY. 30 tablet 6   metFORMIN (GLUCOPHAGE-XR) 500 MG 24 hr tablet Take 1 tablet (500 mg total) by mouth 2 (two) times daily. 60 tablet 6   pravastatin (PRAVACHOL) 40 MG tablet Take 1 tablet (40 mg total) by mouth daily. 30 tablet 6   Docusate Sodium (STOOL SOFTENER LAXATIVE PO) Take 100 mg by mouth. Takes 2 to 4 capsules daily as needed. (Patient not taking: Reported on 11/28/2021)     No current facility-administered medications on file prior to visit.    No Known Allergies  Social History   Socioeconomic History   Marital status: Single    Spouse name: Not on file   Number of children: Not on file   Years of education: Not on file   Highest education level: Not on file  Occupational History   Not on file  Tobacco Use   Smoking status: Never   Smokeless tobacco: Never  Vaping Use   Vaping Use: Never  used  Substance and Sexual Activity   Alcohol use: No   Drug use: No   Sexual activity: Not on file  Other Topics Concern   Not on file  Social History Narrative   Not on file   Social Determinants of Health   Financial Resource Strain: Not on file  Food Insecurity: Not on file  Transportation Needs: Not on file  Physical Activity: Not on file  Stress: Not on file  Social Connections: Not on file  Intimate Partner Violence: Not on file    Family History  Problem Relation Age of Onset   Heart disease Mother    Lung cancer Father    Colon cancer Neg Hx    Esophageal cancer Neg Hx    Liver cancer Neg Hx    Pancreatic cancer Neg  Hx    Rectal cancer Neg Hx    Stomach cancer Neg Hx     No past surgical history on file.  ROS: Review of Systems Negative except as stated above  PHYSICAL EXAM: BP 111/75   Pulse 73   Temp 98.4 F (36.9 C) (Oral)   Ht '5\' 7"'  (1.702 m)   Wt 294 lb 12.8 oz (133.7 kg)   SpO2 94%   BMI 46.17 kg/m   Wt Readings from Last 3 Encounters:  11/28/21 294 lb 12.8 oz (133.7 kg)  09/17/21 289 lb (131.1 kg)  07/29/21 292 lb 9.6 oz (132.7 kg)    Physical Exam   General appearance - alert, well appearing, morbidly obese older Caucasian male and in no distress Mental status - normal mood, behavior, speech, dress, motor activity, and thought processes Neck - supple, no significant adenopathy Chest - clear to auscultation, no wheezes, rales or rhonchi, symmetric air entry Heart - normal rate, regular rhythm, normal S1, S2, no murmurs, rubs, clicks or gallops Extremities - peripheral pulses normal, no pedal edema, no clubbing or cyanosis  Results for orders placed or performed in visit on 11/28/21  POCT glucose (manual entry)  Result Value Ref Range   POC Glucose 132 (A) 70 - 99 mg/dl  POCT glycosylated hemoglobin (Hb A1C)  Result Value Ref Range   Hemoglobin A1C     HbA1c POC (<> result, manual entry)     HbA1c, POC (prediabetic range)     HbA1c, POC (controlled diabetic range) 6.5 0.0 - 7.0 %       Latest Ref Rng & Units 02/04/2021    2:59 PM 11/11/2019    3:06 PM 10/12/2018    8:52 AM  CMP  Glucose 70 - 99 mg/dL 118  96  109   BUN 8 - 27 mg/dL '21  14  16   ' Creatinine 0.76 - 1.27 mg/dL 1.18  0.98  1.15   Sodium 134 - 144 mmol/L 140  140  140   Potassium 3.5 - 5.2 mmol/L 4.1  3.9  3.7   Chloride 96 - 106 mmol/L 94  101  97   CO2 20 - 29 mmol/L '26  25  24   ' Calcium 8.6 - 10.2 mg/dL 9.6  9.5  9.7   Total Protein 6.0 - 8.5 g/dL 7.6  7.3  6.8   Total Bilirubin 0.0 - 1.2 mg/dL 0.7  0.4  0.5   Alkaline Phos 44 - 121 IU/L 43  46  39   AST 0 - 40 IU/L '22  20  16   ' ALT 0 - 44 IU/L 26   25  16  Lipid Panel     Component Value Date/Time   CHOL 137 02/04/2021 1459   TRIG 116 02/04/2021 1459   HDL 36 (L) 02/04/2021 1459   CHOLHDL 3.8 02/04/2021 1459   LDLCALC 80 02/04/2021 1459    CBC    Component Value Date/Time   WBC 11.1 (H) 02/04/2021 1459   WBC 12.1 (H) 09/23/2016 0953   RBC 5.02 02/04/2021 1459   RBC 4.66 09/23/2016 0953   HGB 15.3 02/04/2021 1459   HCT 45.6 02/04/2021 1459   PLT 212 02/04/2021 1459   MCV 91 02/04/2021 1459   MCH 30.5 02/04/2021 1459   MCH 29.2 09/23/2016 0953   MCHC 33.6 02/04/2021 1459   MCHC 32.1 09/23/2016 0953   RDW 12.6 02/04/2021 1459    ASSESSMENT AND PLAN:  1. Type 2 diabetes mellitus with morbid obesity (HCC) At goal.  Continue metformin 500 mg twice a day. Encouraged him to eat smaller portion sizes. Challenged him to increase his walking to 3 days a week. - POCT glucose (manual entry) - POCT glycosylated hemoglobin (Hb A1C)  2. Hypertension associated with diabetes ([place]) Continue current blood pressure medications as listed above.  At goal.  3. Hyperlipidemia, unspecified hyperlipidemia type Continue pravastatin.  We will do his blood test on next visit.  4. Anal fistula Plan for surgical procedure under anesthesia in 2 weeks.  Patient is medically optimized for this procedure.     Patient was given the opportunity to ask questions.  Patient verbalized understanding of the plan and was able to repeat key elements of the plan.   This documentation was completed using Radio producer.  Any transcriptional errors are unintentional.  Orders Placed This Encounter  Procedures   POCT glucose (manual entry)   POCT glycosylated hemoglobin (Hb A1C)     Requested Prescriptions    No prescriptions requested or ordered in this encounter    Return in about 4 months (around 03/31/2022).  Karle Plumber, MD, FACP

## 2021-11-28 NOTE — Patient Instructions (Signed)

## 2021-11-29 ENCOUNTER — Other Ambulatory Visit: Payer: Self-pay

## 2021-12-09 ENCOUNTER — Encounter (HOSPITAL_BASED_OUTPATIENT_CLINIC_OR_DEPARTMENT_OTHER): Payer: Self-pay | Admitting: General Surgery

## 2021-12-09 ENCOUNTER — Other Ambulatory Visit: Payer: Self-pay

## 2021-12-09 NOTE — Progress Notes (Signed)
.......  [birth year]  patient's date of birth is December 13, 1960  Your patient has screened at an elevated risk for obstructive sleep apnea using the STOP-Bang tool during a presurgical visit. A score of 5 or greater is an elevated risk.

## 2021-12-09 NOTE — Progress Notes (Addendum)
Spoke w/ via phone for pre-op interview---pt Lab needs dos---- ekg, I stat              Lab results------ COVID test -----patient states asymptomatic no test needed Arrive at -------930 am 12-12-2021 NPO after MN NO Solid Food.  Clear liquids from MN until---830 am Med rec completed Medications to take morning of surgery -----Amlodipine, Carvedilol, pravastatin Diabetic medication -----none day of surgery Patient instructed no nail polish to be worn day of surgery Patient instructed to bring photo id and insurance card day of surgery Patient aware to have Driver (ride ) / caregiver    for 24 hours after surgery  Patient Special Instructions -----none Pre-Op special Istructions ----none Patient verbalized understanding of instructions that were given at this phone interview. Patient denies shortness of breath, chest pain, fever, cough at this phone interview.

## 2021-12-12 ENCOUNTER — Ambulatory Visit (HOSPITAL_BASED_OUTPATIENT_CLINIC_OR_DEPARTMENT_OTHER): Payer: Self-pay | Admitting: Anesthesiology

## 2021-12-12 ENCOUNTER — Other Ambulatory Visit: Payer: Self-pay

## 2021-12-12 ENCOUNTER — Encounter (HOSPITAL_BASED_OUTPATIENT_CLINIC_OR_DEPARTMENT_OTHER): Payer: Self-pay | Admitting: General Surgery

## 2021-12-12 ENCOUNTER — Ambulatory Visit (HOSPITAL_BASED_OUTPATIENT_CLINIC_OR_DEPARTMENT_OTHER)
Admission: RE | Admit: 2021-12-12 | Discharge: 2021-12-12 | Disposition: A | Discharge status: Home / Self Care | Payer: Self-pay | Source: Ambulatory Visit | Attending: General Surgery | Admitting: General Surgery

## 2021-12-12 ENCOUNTER — Encounter (HOSPITAL_BASED_OUTPATIENT_CLINIC_OR_DEPARTMENT_OTHER)
Admission: RE | Disposition: A | Discharge status: Home / Self Care | Payer: Self-pay | Source: Ambulatory Visit | Attending: General Surgery

## 2021-12-12 DIAGNOSIS — K219 Gastro-esophageal reflux disease without esophagitis: Secondary | ICD-10-CM | POA: Insufficient documentation

## 2021-12-12 DIAGNOSIS — I10 Essential (primary) hypertension: Secondary | ICD-10-CM | POA: Insufficient documentation

## 2021-12-12 DIAGNOSIS — Z6841 Body Mass Index (BMI) 40.0 and over, adult: Secondary | ICD-10-CM

## 2021-12-12 DIAGNOSIS — K603 Anal fistula: Secondary | ICD-10-CM

## 2021-12-12 DIAGNOSIS — Z79899 Other long term (current) drug therapy: Secondary | ICD-10-CM | POA: Insufficient documentation

## 2021-12-12 DIAGNOSIS — Z01818 Encounter for other preprocedural examination: Secondary | ICD-10-CM

## 2021-12-12 DIAGNOSIS — I4510 Unspecified right bundle-branch block: Secondary | ICD-10-CM | POA: Insufficient documentation

## 2021-12-12 DIAGNOSIS — Z7984 Long term (current) use of oral hypoglycemic drugs: Secondary | ICD-10-CM

## 2021-12-12 DIAGNOSIS — E119 Type 2 diabetes mellitus without complications: Secondary | ICD-10-CM | POA: Insufficient documentation

## 2021-12-12 HISTORY — PX: FISTULOTOMY: SHX6413

## 2021-12-12 HISTORY — DX: Gastro-esophageal reflux disease without esophagitis: K21.9

## 2021-12-12 LAB — POCT I-STAT, CHEM 8
BUN: 9 mg/dL (ref 8–23)
Calcium, Ion: 1.24 mmol/L (ref 1.15–1.40)
Chloride: 97 mmol/L — ABNORMAL LOW (ref 98–111)
Creatinine, Ser: 0.7 mg/dL (ref 0.61–1.24)
Glucose, Bld: 138 mg/dL — ABNORMAL HIGH (ref 70–99)
HCT: 42 % (ref 39.0–52.0)
Hemoglobin: 14.3 g/dL (ref 13.0–17.0)
Potassium: 3.3 mmol/L — ABNORMAL LOW (ref 3.5–5.1)
Sodium: 139 mmol/L (ref 135–145)
TCO2: 27 mmol/L (ref 22–32)

## 2021-12-12 LAB — GLUCOSE, CAPILLARY: Glucose-Capillary: 145 mg/dL — ABNORMAL HIGH (ref 70–99)

## 2021-12-12 SURGERY — FISTULOTOMY
Anesthesia: Monitor Anesthesia Care | Site: Rectum

## 2021-12-12 MED ORDER — ACETAMINOPHEN 500 MG PO TABS
1000.0000 mg | ORAL_TABLET | ORAL | Status: AC
  Administered 2021-12-12: 1000 mg via ORAL

## 2021-12-12 MED ORDER — PROPOFOL 500 MG/50ML IV EMUL
INTRAVENOUS | Status: DC | PRN
  Administered 2021-12-12: 200 ug/kg/min via INTRAVENOUS

## 2021-12-12 MED ORDER — MIDAZOLAM HCL 2 MG/2ML IJ SOLN
INTRAMUSCULAR | Status: AC
  Filled 2021-12-12: qty 2

## 2021-12-12 MED ORDER — TRAMADOL HCL 50 MG PO TABS
50.0000 mg | ORAL_TABLET | Freq: Four times a day (QID) | ORAL | 0 refills | Status: DC | PRN
  Filled 2021-12-12: qty 15, 4d supply, fill #0

## 2021-12-12 MED ORDER — FENTANYL CITRATE (PF) 100 MCG/2ML IJ SOLN: 25.0000 ug | INTRAMUSCULAR | Status: DC | PRN

## 2021-12-12 MED ORDER — ONDANSETRON HCL 4 MG/2ML IJ SOLN: 4.0000 mg | Freq: Once | INTRAMUSCULAR | Status: DC | PRN

## 2021-12-12 MED ORDER — OXYCODONE HCL 5 MG/5ML PO SOLN: 5.0000 mg | Freq: Once | ORAL | Status: DC | PRN

## 2021-12-12 MED ORDER — 0.9 % SODIUM CHLORIDE (POUR BTL) OPTIME
TOPICAL | Status: DC | PRN
  Administered 2021-12-12: 1000 mL

## 2021-12-12 MED ORDER — CELECOXIB 200 MG PO CAPS
200.0000 mg | ORAL_CAPSULE | ORAL | Status: AC
  Administered 2021-12-12: 200 mg via ORAL

## 2021-12-12 MED ORDER — FENTANYL CITRATE (PF) 100 MCG/2ML IJ SOLN
INTRAMUSCULAR | Status: DC | PRN
  Administered 2021-12-12: 50 ug via INTRAVENOUS

## 2021-12-12 MED ORDER — AMISULPRIDE (ANTIEMETIC) 5 MG/2ML IV SOLN: 10.0000 mg | Freq: Once | INTRAVENOUS | Status: DC | PRN

## 2021-12-12 MED ORDER — PROPOFOL 500 MG/50ML IV EMUL
INTRAVENOUS | Status: AC
  Filled 2021-12-12: qty 50

## 2021-12-12 MED ORDER — BUPIVACAINE-EPINEPHRINE 0.5% -1:200000 IJ SOLN
INTRAMUSCULAR | Status: DC | PRN
  Administered 2021-12-12: 30 mL

## 2021-12-12 MED ORDER — SODIUM CHLORIDE 0.9% FLUSH: 3.0000 mL | Freq: Two times a day (BID) | INTRAVENOUS | Status: DC

## 2021-12-12 MED ORDER — LIDOCAINE HCL (PF) 2 % IJ SOLN
INTRAMUSCULAR | Status: AC
  Filled 2021-12-12: qty 5

## 2021-12-12 MED ORDER — MIDAZOLAM HCL 2 MG/2ML IJ SOLN
INTRAMUSCULAR | Status: DC | PRN
  Administered 2021-12-12: 1 mg via INTRAVENOUS

## 2021-12-12 MED ORDER — OXYCODONE HCL 5 MG PO TABS: 5.0000 mg | ORAL_TABLET | Freq: Once | ORAL | Status: DC | PRN

## 2021-12-12 MED ORDER — LACTATED RINGERS IV SOLN
INTRAVENOUS | Status: DC
Start: 2021-12-12 — End: 2021-12-12

## 2021-12-12 MED ORDER — ACETAMINOPHEN 500 MG PO TABS
ORAL_TABLET | ORAL | Status: AC
  Filled 2021-12-12: qty 2

## 2021-12-12 MED ORDER — FENTANYL CITRATE (PF) 100 MCG/2ML IJ SOLN
INTRAMUSCULAR | Status: AC
  Filled 2021-12-12: qty 2

## 2021-12-12 MED ORDER — CELECOXIB 200 MG PO CAPS
ORAL_CAPSULE | ORAL | Status: AC
  Filled 2021-12-12: qty 1

## 2021-12-12 SURGICAL SUPPLY — 37 items
ABDOMINAL PAD ABD ×1 IMPLANT
APL SKNCLS STERI-STRIP NONHPOA (GAUZE/BANDAGES/DRESSINGS) ×1
BENZOIN TINCTURE PRP APPL 2/3 (GAUZE/BANDAGES/DRESSINGS) ×3 IMPLANT
COVER BACK TABLE 60X90IN (DRAPES) ×2 IMPLANT
COVER MAYO STAND STRL (DRAPES) ×2 IMPLANT
DECANTER SPIKE VIAL GLASS SM (MISCELLANEOUS) ×2 IMPLANT
DRAPE LAPAROTOMY 100X72 PEDS (DRAPES) ×2 IMPLANT
DRAPE UTILITY XL STRL (DRAPES) ×2 IMPLANT
DRSG PAD ABDOMINAL 8X10 ST (GAUZE/BANDAGES/DRESSINGS) ×2 IMPLANT
ELECT REM PT RETURN 9FT ADLT (ELECTROSURGICAL) ×2
ELECTRODE REM PT RTRN 9FT ADLT (ELECTROSURGICAL) ×1 IMPLANT
GAUZE 4X4 16PLY ~~LOC~~+RFID DBL (SPONGE) ×2 IMPLANT
GAUZE SPONGE 4X4 12PLY STRL (GAUZE/BANDAGES/DRESSINGS) ×2 IMPLANT
GLOVE BIO SURGEON STRL SZ 6.5 (GLOVE) ×2 IMPLANT
GLOVE BIOGEL PI IND STRL 7.0 (GLOVE) ×1 IMPLANT
GLOVE BIOGEL PI INDICATOR 7.0 (GLOVE) ×3
GLOVE INDICATOR 6.5 STRL GRN (GLOVE) ×2 IMPLANT
GLOVE SURG UNDER LTX SZ6.5 (GLOVE) ×1 IMPLANT
GOWN STRL REUS W/TWL XL LVL3 (GOWN DISPOSABLE) ×3 IMPLANT
IV CATH 14GX2 1/4 (CATHETERS) ×2 IMPLANT
IV CATH 18G SAFETY (IV SOLUTION) ×2 IMPLANT
KIT TURNOVER CYSTO (KITS) ×2 IMPLANT
LOOP VESSEL MAXI BLUE (MISCELLANEOUS) ×1 IMPLANT
NEEDLE HYPO 22GX1.5 SAFETY (NEEDLE) ×2 IMPLANT
NS IRRIG 500ML POUR BTL (IV SOLUTION) ×2 IMPLANT
PACK BASIN DAY SURGERY FS (CUSTOM PROCEDURE TRAY) ×2 IMPLANT
PAD ARMBOARD 7.5X6 YLW CONV (MISCELLANEOUS) ×2 IMPLANT
PANTS MESH DISP LRG (UNDERPADS AND DIAPERS) ×2 IMPLANT
PENCIL SMOKE EVACUATOR (MISCELLANEOUS) ×2 IMPLANT
SPIKE FLUID TRANSFER (MISCELLANEOUS) ×1 IMPLANT
SUT ETHIBOND 0 (SUTURE) ×1 IMPLANT
SYR CONTROL 10ML LL (SYRINGE) ×2 IMPLANT
TOWEL OR 17X26 10 PK STRL BLUE (TOWEL DISPOSABLE) ×2 IMPLANT
TRAY DSU PREP LF (CUSTOM PROCEDURE TRAY) ×2 IMPLANT
TUBE CONNECTING 12X1/4 (SUCTIONS) ×2 IMPLANT
UNDERPAD 30X36 HEAVY ABSORB (UNDERPADS AND DIAPERS) ×2 IMPLANT
YANKAUER SUCT BULB TIP NO VENT (SUCTIONS) ×2 IMPLANT

## 2021-12-12 NOTE — Anesthesia Procedure Notes (Signed)
Procedure Name: MAC Date/Time: 12/12/2021 11:11 AM  Performed by: Suan Halter, CRNAPre-anesthesia Checklist: Patient identified, Emergency Drugs available, Suction available and Patient being monitored Patient Re-evaluated:Patient Re-evaluated prior to induction Oxygen Delivery Method: Supernova nasal CPAP Placement Confirmation: positive ETCO2

## 2021-12-12 NOTE — Transfer of Care (Signed)
Immediate Anesthesia Transfer of Care Note  Patient: Scott Mcmillan  Procedure(s) Performed: Procedure(s) (LRB): SETON PLACEMENT (N/A) EXAM UNDER ANESTHESIA (N/A)  Patient Location: Phase 2  Anesthesia Type: MAC  Level of Consciousness: awake, alert , oriented and patient cooperative  Airway & Oxygen Therapy: Patient Spontanous Breathing and Patient connected to face mask oxygen  Post-op Assessment: Report given to Phase 2  RN and Post -op Vital signs reviewed and stable  Post vital signs: Reviewed and stable  Complications: No apparent anesthesia complications  Last Vitals:  Vitals Value Taken Time  BP    Temp    Pulse    Resp    SpO2      Last Pain:  Vitals:   12/12/21 1008  TempSrc: Oral  PainSc: 0-No pain      Patients Stated Pain Goal: 5 (96/22/29 7989)  Complications: No notable events documented.

## 2021-12-12 NOTE — Discharge Instructions (Addendum)
ANORECTAL SURGERY: POST OP INSTRUCTIONS Take your usually prescribed home medications unless otherwise directed. DIET: During the first few hours after surgery sip on some liquids until you are able to urinate.  It is normal to not urinate for several hours after this surgery.  If you feel uncomfortable, please contact the office for instructions.  After you are able to urinate,you may eat, if you feel like it.  Follow a light bland diet the first 24 hours after arrival home, such as soup, liquids, crackers, etc.  Be sure to include lots of fluids daily (6-8 glasses).  Avoid fast food or heavy meals, as your are more likely to get nauseated.  Eat a low fat diet the next few days after surgery.  Limit caffeine intake to 1-2 servings a day. PAIN CONTROL: Pain is best controlled by a usual combination of several different methods TOGETHER: Muscle relaxation: Soak in a warm bath (or Sitz bath) three times a day and after bowel movements.  Continue to do this until all pain is resolved. Over the counter pain medication Prescription pain medication Most patients will experience some swelling and discomfort in the anus/rectal area and incisions.  Heat such as warm towels, sitz baths, warm baths, etc to help relax tight/sore spots and speed recovery.  Some people prefer to use ice, especially in the first couple days after surgery, as it may decrease the pain and swelling, or alternate between ice & heat.  Experiment to what works for you.  Swelling and bruising can take several weeks to resolve.  Pain can take even longer to completely resolve. It is helpful to take an over-the-counter pain medication regularly for the first few weeks.  Choose one of the following that works best for you: Naproxen (Aleve, etc)  Two 220mg tabs twice a day Ibuprofen (Advil, etc) Three 200mg tabs four times a day (every meal & bedtime) A  prescription for pain medication (such as percocet, oxycodone, hydrocodone, etc) should be  given to you upon discharge.  Take your pain medication as prescribed.  If you are having problems/concerns with the prescription medicine (does not control pain, nausea, vomiting, rash, itching, etc), please call us [phone] to see if we need to switch you to a different pain medicine that will work better for you and/or control your side effect better. If you need a refill on your pain medication, please contact your pharmacy.  They will contact our office to request authorization. Prescriptions will not be filled after 5 pm or on week-ends. KEEP YOUR BOWELS REGULAR and AVOID CONSTIPATION The goal is one to two soft bowel movements a day.  You should at least have a bowel movement every other day. Avoid getting constipated.  Between the surgery and the pain medications, it is common to experience some constipation. This can be very painful after rectal surgery.  Increasing fluid intake and taking a fiber supplement (such as Metamucil, Citrucel, FiberCon, etc) 1-2 times a day regularly will usually help prevent this problem from occurring.  A stool softener like colace is also recommended.  This can be purchased over the counter at your pharmacy.  You can take it up to 3 times a day.  If you do not have a bowel movement after 24 hrs since your surgery, take one does of milk of magnesia.  If you still haven't had a bowel movement 8-12 hours after that dose, take another dose.  If you don't have a bowel movement 48 hrs after surgery,   purchase a Fleets enema from the drug store and administer gently per package instructions.  If you still are having trouble with your bowel movements after that, please call the office for further instructions. If you develop diarrhea or have many loose bowel movements, simplify your diet to bland foods & liquids for a few days.  Stop any stool softeners and decrease your fiber supplement.  Switching to mild anti-diarrheal medications (Kayopectate, Pepto Bismol) can help.   If this worsens or does not improve, please call us.  Wound Care Remove your bandages before your first bowel movement or 8 hours after surgery.     Remove any wound packing material at this tim,e as well.  You do not need to repack the wound unless instructed otherwise.  Wear an absorbent pad or soft cotton gauze in your underwear to catch any drainage and help keep the area clean. You should change this every 2-3 hours while awake. Keep the area clean and dry.  Bathe / shower every day, especially after bowel movements.  Keep the area clean by showering / bathing over the incision / wound.   It is okay to soak an open wound to help wash it.  Wet wipes or showers / gentle washing after bowel movements is often less traumatic than regular toilet paper. You may have some styrofoam-like soft packing in the rectum which will come out with the first bowel movement.  You will often notice bleeding with bowel movements.  This should slow down by the end of the first week of surgery Expect some drainage.  This should slow down, too, by the end of the first week of surgery.  Wear an absorbent pad or soft cotton gauze in your underwear until the drainage stops. Do Not sit on a rubber or pillow ring.  This can make you symptoms worse.  You may sit on a soft pillow if needed.  ACTIVITIES as tolerated:   You may resume regular (light) daily activities beginning the next day--such as daily self-care, walking, climbing stairs--gradually increasing activities as tolerated.  If you can walk 30 minutes without difficulty, it is safe to try more intense activity such as jogging, treadmill, bicycling, low-impact aerobics, swimming, etc. Save the most intensive and strenuous activity for last such as sit-ups, heavy lifting, contact sports, etc  Refrain from any heavy lifting or straining until you are off narcotics for pain control.   You may drive when you are no longer taking prescription pain medication, you can  comfortably sit for long periods of time, and you can safely maneuver your car and apply brakes. You may have sexual intercourse when it is comfortable.  FOLLOW UP in our office Please call [facility] at [phone] to set up an appointment to see your surgeon in the office for a follow-up appointment approximately 3-4 weeks after your surgery. Make sure that you call for this appointment the day you arrive home to insure a convenient appointment time. 10. IF YOU HAVE DISABILITY OR FAMILY LEAVE FORMS, BRING THEM TO THE OFFICE FOR PROCESSING.  DO NOT GIVE THEM TO YOUR DOCTOR.     WHEN TO CALL us [phone]: Poor pain control Reactions / problems with new medications (rash/itching, nausea, etc)  Fever over 101.5 F (38.5 C) Inability to urinate Nausea and/or vomiting Worsening swelling or bruising Continued bleeding from incision. Increased pain, redness, or drainage from the incision  The clinic staff is available to answer your questions during regular business hours (8:30am-5pm).  Please don't hesitate to call and ask to speak to one of our nurses for clinical concerns.   A surgeon from [facility] is always on call at the hospitals   If you have a medical emergency, go to the nearest emergency room or call 911.    [facility] [address], [place], [place]  [address] ? MAIN: [phone] ? TOLL FREE: [phone] ? FAX [phone] [email or web address]       No ibuprofen, Advil, Aleve, Motrin, ketorolac, meloxicam, naproxen, or other NSAIDS until after 4:15 pm today if needed.   No acetaminophen/Tylenol until after 4:15 pm today if needed.    Post Anesthesia Home Care Instructions  Activity: Get plenty of rest for the remainder of the day. A responsible individual must stay with you for 24 hours following the procedure.  For the next 24 hours, DO NOT: -Drive a car -Advertising copywriter -Drink alcoholic  beverages -Take any medication unless instructed by your physician -Make any legal decisions or sign important papers.  Meals: Start with liquid foods such as gelatin or soup. Progress to regular foods as tolerated. Avoid greasy, spicy, heavy foods. If nausea and/or vomiting occur, drink only clear liquids until the nausea and/or vomiting subsides. Call your physician if vomiting continues.  Special Instructions/Symptoms: Your throat may feel dry or sore from the anesthesia or the breathing tube placed in your throat during surgery. If this causes discomfort, gargle with warm salt water. The discomfort should disappear within 24 hours.

## 2021-12-12 NOTE — Interval H&P Note (Signed)
History and Physical Interval Note:  12/12/2021 10:29 AM  Scott Mcmillan  has presented today for surgery, with the diagnosis of anal fistula.  The various methods of treatment have been discussed with the patient and family. After consideration of risks, benefits and other options for treatment, the patient has consented to  Procedure(s): FISTULOTOMY VS SETON (N/A) EXAM UNDER ANESTHESIA (N/A) as a surgical intervention.  The patient's history has been reviewed, patient examined, no change in status, stable for surgery.  I have reviewed the patient's chart and labs.  Questions were answered to the patient's satisfaction.     Vanita Panda, MD  Colorectal and General Surgery [facility]

## 2021-12-12 NOTE — Anesthesia Postprocedure Evaluation (Signed)
Anesthesia Post Note  Patient: Scott Mcmillan  Procedure(s) Performed: Delphina Cahill PLACEMENT (Rectum) EXAM UNDER ANESTHESIA (Rectum)     Patient location during evaluation: PACU Anesthesia Type: MAC Level of consciousness: awake and alert Pain management: pain level controlled Vital Signs Assessment: post-procedure vital signs reviewed and stable Respiratory status: spontaneous breathing, nonlabored ventilation and respiratory function stable Cardiovascular status: blood pressure returned to baseline and stable Postop Assessment: no apparent nausea or vomiting Anesthetic complications: no   No notable events documented.  Last Vitals:  Vitals:   12/12/21 1008 12/12/21 1140  BP: (!) 140/81 131/84  Pulse: 70 69  Resp: 18 16  Temp: 36.6 C 36.6 C  SpO2: 97% 94%    Last Pain:  Vitals:   12/12/21 1140  TempSrc:   PainSc: 0-No pain                 Lidia Collum

## 2021-12-12 NOTE — Op Note (Signed)
12/12/2021  11:56 AM  PATIENT:  Scott Mcmillan  60 y.o. male  Patient Care Team: Ladell Pier, MD as PCP - General (Internal Medicine)  PRE-OPERATIVE DIAGNOSIS:  Anal fistula  POST-OPERATIVE DIAGNOSIS:  Trans-sphincteric anal fistula  PROCEDURE:   SETON PLACEMENT EXAM UNDER ANESTHESIA  SURGEON:  Surgeon(s): Leighton Ruff, MD  ASSISTANT: none   ANESTHESIA:   local and MAC  SPECIMEN:  No Specimen  DISPOSITION OF SPECIMEN:  N/A  COUNTS:  YES  PLAN OF CARE: Discharge to home after PACU  PATIENT DISPOSITION:  PACU - hemodynamically stable.  INDICATION: 61 y.o. M with anal fistula   OR FINDINGS: R lateral trans-sphincteric fistula with anterior midline internal opening proximal to the dentate line  DESCRIPTION: the patient was identified in the preoperative holding area and taken to the OR where they were laid on the operating room table.  MAC anesthesia was induced without difficulty. The patient was then positioned in prone jackknife position with buttocks gently taped apart.  The patient was then prepped and draped in usual sterile fashion.  SCDs were noted to be in place prior to the initiation of anesthesia. A surgical timeout was performed indicating the correct patient, procedure, positioning and need for preoperative antibiotics.  A rectal block was performed using Marcaine with epinephrine.    I began with a digital rectal exam.  Rectal tone was good.  I gently dilated to approximately 2 fingerbreadths.  I then placed a Hill-Ferguson anoscope into the anal canal and evaluated this completely.  There was minimal hemorrhoid disease noted.  I then used an S shaped fistula probe and placed this through the external opening in the right lateral anal canal.  This easily traversed the fistula tract and ended at anterior midline within the distal rectal mucosa proximal to the dentate line.  This appeared to be more than 20% of the sphincter complex and therefore I recommended  a seton to be placed.  This was obtained and an Ethibond suture was pulled through the fistula tract using the fistula probe.  The Ethibond was then tied to a vessel loop and brought back through the fistula tract creating the seton.  This was secured into place with 3 Ethibond sutures.  Additional Marcaine was placed around the incision sites.  Patient was then awakened from anesthesia and sent to the postanesthesia care unit in stable condition.  All counts were correct per operating room staff.  Rosario Adie, MD  Colorectal and [facility]

## 2021-12-12 NOTE — Anesthesia Preprocedure Evaluation (Signed)
Anesthesia Evaluation  Patient identified by MRN, date of birth, ID band Patient awake    Reviewed: Allergy & Precautions, NPO status , Patient's Chart, lab work & pertinent test results  History of Anesthesia Complications Negative for: history of anesthetic complications  Airway Mallampati: II  TM Distance: >3 FB Neck ROM: Full    Dental   Pulmonary neg pulmonary ROS,    Pulmonary exam normal        Cardiovascular hypertension, Pt. on medications and Pt. on home beta blockers Normal cardiovascular exam     Neuro/Psych negative neurological ROS     GI/Hepatic Neg liver ROS, GERD  ,Anal fistula   Endo/Other  diabetes, Type 2Morbid obesity  Renal/GU negative Renal ROS  negative genitourinary   Musculoskeletal negative musculoskeletal ROS (+)   Abdominal   Peds  Hematology negative hematology ROS (+)   Anesthesia Other Findings   Reproductive/Obstetrics                            Anesthesia Physical Anesthesia Plan  ASA: 3  Anesthesia Plan: MAC   Post-op Pain Management: Tylenol PO (pre-op)* and Celebrex PO (pre-op)*   Induction: Intravenous  PONV Risk Score and Plan: 1 and Propofol infusion, TIVA and Treatment may vary due to age or medical condition  Airway Management Planned: Natural Airway, Nasal Cannula and Simple Face Mask  Additional Equipment: None  Intra-op Plan:   Post-operative Plan:   Informed Consent: I have reviewed the patients History and Physical, chart, labs and discussed the procedure including the risks, benefits and alternatives for the proposed anesthesia with the patient or authorized representative who has indicated his/her understanding and acceptance.       Plan Discussed with:   Anesthesia Plan Comments:         Anesthesia Quick Evaluation

## 2021-12-13 ENCOUNTER — Encounter (HOSPITAL_BASED_OUTPATIENT_CLINIC_OR_DEPARTMENT_OTHER): Payer: Self-pay | Admitting: General Surgery

## 2022-01-07 ENCOUNTER — Other Ambulatory Visit: Payer: Self-pay

## 2022-01-08 ENCOUNTER — Other Ambulatory Visit: Payer: Self-pay

## 2022-01-09 ENCOUNTER — Other Ambulatory Visit: Payer: Self-pay

## 2022-02-11 ENCOUNTER — Other Ambulatory Visit: Payer: Self-pay

## 2022-03-11 ENCOUNTER — Ambulatory Visit: Payer: Self-pay | Admitting: General Surgery

## 2022-03-11 ENCOUNTER — Other Ambulatory Visit: Payer: Self-pay

## 2022-03-11 NOTE — H&P (Signed)
PROVIDER:  Monico Blitz, MD  MRN: [id number] DOB: 09-27-1960 DATE OF ENCOUNTER: 03/11/2022 Interval History:  Patient presented to the office with approximately 64-monthhistory of draining perianal lesion with pain.  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  A seton was placed.  Past Medical History:  Diagnosis Date   Bilateral inguinal hernia    per 09-28-2027 ct abdomen with contrast epic   Diabetes ([place] Type 2    GERD (gastroesophageal reflux disease)    Hemorrhoids    Hyperlipidemia    Hypertension    Past Surgical History:  Procedure Laterality Date   COLONOSCOPY     ~5 yrs ago   FISTULOTOMY N/A 12/12/2021   Procedure: SETON PLACEMENT;  Surgeon: TLeighton Ruff MD;  Location: [facility]  Service: General;  Laterality: N/A;   NO PAST SURGERIES     Family History  Problem Relation Age of Onset   Heart disease Mother    Lung cancer Father    Colon cancer Neg Hx    Esophageal cancer Neg Hx    Liver cancer Neg Hx    Pancreatic cancer Neg Hx    Rectal cancer Neg Hx    Stomach cancer Neg Hx    Social History   Socioeconomic History   Marital status: Single    Spouse name: Not on file   Number of children: Not on file   Years of education: Not on file   Highest education level: Not on file  Occupational History   Not on file  Tobacco Use   Smoking status: Never   Smokeless tobacco: Never  Vaping Use   Vaping Use: Never used  Substance and Sexual Activity   Alcohol use: No   Drug use: No   Sexual activity: Not on file  Other Topics Concern   Not on file  Social History Narrative   Not on file   Social Determinants of Health   Financial Resource Strain: Not on file  Food Insecurity: Not on file  Transportation Needs: Not on file  Physical Activity: Not on file  Stress: Not on  file  Social Connections: Not on file  Intimate Partner Violence: Not on file    Current Outpatient Medications:    amLODipine (NORVASC) 10 MG tablet, TAKE 1 TABLET (10 MG TOTAL) BY MOUTH DAILY., Disp: 30 tablet, Rfl: 6   aspirin EC 81 MG tablet, Take 1 tablet (81 mg total) by mouth daily., Disp: 90 tablet, Rfl: 3   Blood Glucose Monitoring Suppl (TRUE METRIX METER) w/Device KIT, Use as directed, Disp: 1 kit, Rfl: 0   carvedilol (COREG) 12.5 MG tablet, Take 1 tablet (12.5 mg total) by mouth 2 (two) times daily with a meal., Disp: 60 tablet, Rfl: 6   Docusate Sodium (STOOL SOFTENER LAXATIVE PO), Take 100 mg by mouth. Takes 2 to 4 capsules daily as needed., Disp: , Rfl:    glucose blood (TRUE METRIX BLOOD GLUCOSE TEST) test strip, Use as instructed, Disp: 100 each, Rfl: 12   hydrochlorothiazide (HYDRODIURIL) 25 MG tablet, TAKE 1 TABLET (25 MG TOTAL) BY MOUTH DAILY., Disp: 30 tablet, Rfl: 6   lisinopril (ZESTRIL) 40 MG tablet, TAKE 1 TABLET (40 MG TOTAL) BY MOUTH DAILY., Disp: 30 tablet, Rfl: 6   metFORMIN (GLUCOPHAGE-XR) 500 MG 24 hr tablet, Take 1 tablet (500 mg  total) by mouth 2 (two) times daily., Disp: 60 tablet, Rfl: 6   pravastatin (PRAVACHOL) 40 MG tablet, Take 1 tablet (40 mg total) by mouth daily., Disp: 30 tablet, Rfl: 6   traMADol (ULTRAM) 50 MG tablet, Take 1 tablet (50 mg total) by mouth every 6 (six) hours as needed., Disp: 15 tablet, Rfl: 0 No Known Allergies Review of Systems - Negative except as stated above    Physical Examination:   Gen: NAD CV: RRR Lungs: CTA Incision: Seton in place, ant midline  Assessment and Plan:   Scott Mcmillan is a 61 y.o. male who underwent seton placement for a transsphincteric anterior anal fistula.  Patient has a anterior fistula.  We discussed performing a lift procedure with a mucosal advancement flap internally to hopefully help prevent this from recurring.  We discussed a recurrence rate of approximately 20%.  Other risks include  bleeding, pain and a small chance of incontinence.  All questions were answered.  There are no diagnoses linked to this encounter.   No follow-ups on file.   The plan was discussed in detail with the patient today, who expressed understanding.  The patient has my contact information, and understands to call me with any additional questions or concerns in the interval.  I would be happy to see the patient back sooner if the need arises.   Rosario Adie, MD Colon and Rectal Surgery

## 2022-03-12 ENCOUNTER — Other Ambulatory Visit: Payer: Self-pay

## 2022-03-17 ENCOUNTER — Telehealth: Payer: Self-pay | Admitting: Internal Medicine

## 2022-03-17 NOTE — Telephone Encounter (Signed)
Pt is aware of outcome w/ [employer] - revoked due to incomp Medicaid app process. Pt still has pending [facility] app. Advised to apply for Medicaid as he may be eligible. Pt stated he has surgery next month, but can't be scheduled w/o a way to pay for the bill.

## 2022-03-31 ENCOUNTER — Encounter: Payer: Self-pay | Admitting: Internal Medicine

## 2022-03-31 ENCOUNTER — Other Ambulatory Visit: Payer: Self-pay | Admitting: Internal Medicine

## 2022-03-31 ENCOUNTER — Other Ambulatory Visit: Payer: Self-pay

## 2022-03-31 ENCOUNTER — Ambulatory Visit: Payer: Self-pay | Attending: Internal Medicine | Admitting: Internal Medicine

## 2022-03-31 DIAGNOSIS — K603 Anal fistula: Secondary | ICD-10-CM

## 2022-03-31 DIAGNOSIS — Z23 Encounter for immunization: Secondary | ICD-10-CM

## 2022-03-31 DIAGNOSIS — E1159 Type 2 diabetes mellitus with other circulatory complications: Secondary | ICD-10-CM

## 2022-03-31 DIAGNOSIS — E1169 Type 2 diabetes mellitus with other specified complication: Secondary | ICD-10-CM

## 2022-03-31 DIAGNOSIS — E785 Hyperlipidemia, unspecified: Secondary | ICD-10-CM

## 2022-03-31 DIAGNOSIS — I152 Hypertension secondary to endocrine disorders: Secondary | ICD-10-CM

## 2022-03-31 LAB — POCT GLYCOSYLATED HEMOGLOBIN (HGB A1C): HbA1c, POC (controlled diabetic range): 6.5 % (ref 0.0–7.0)

## 2022-03-31 LAB — GLUCOSE, POCT (MANUAL RESULT ENTRY): POC Glucose: 110 mg/dl — AB (ref 70–99)

## 2022-03-31 NOTE — Addendum Note (Signed)
Addended by: Jonah Blue B on: 03/31/2022 05:22 PM   Modules accepted: Level of Service

## 2022-03-31 NOTE — Progress Notes (Signed)
Patient ID: Scott Mcmillan, male    DOB: [birth year]  MRN: [id number]  CC: Diabetes (DM f/u. No questions / concerns/Yes to flu vax.)   Subjective: Scott Mcmillan is a 61 y.o. male who presents for chronic disease management. His concerns today include:  Patient with history of HTN, HL, morbid obesity, DM.  DIABETES TYPE 2 Last A1C:   Results for orders placed or performed in visit on 03/31/22  POCT glucose (manual entry)  Result Value Ref Range   POC Glucose 110 (A) 70 - 99 mg/dl  POCT glycosylated hemoglobin (Hb A1C)  Result Value Ref Range   Hemoglobin A1C     HbA1c POC (<> result, manual entry)     HbA1c, POC (prediabetic range)     HbA1c, POC (controlled diabetic range) 6.5 0.0 - 7.0 %    Med Adherence:  _0  Yes  -Metformin 500 mg BID Medication side effects:  _1  Yes    _2  No Home Monitoring?  _3  Yes    _4  No Home glucose results range:has not checked in past 2-3 wks.  Needs stripes Diet Adherence: "I eat too much sometimes."  Wgh is up and down.  Exercise: _5  Yes    _6  No; not a lot lately since rectal surgery Hypoglycemic episodes?: _7  Yes    _8  No Numbness of the feet? _9  Yes    _10  No Retinopathy hx? 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  Needs to have additional procedure done but has to wait until he gets [facility] discount again.  Plans to try to sign up for Medicaid the end of this yr.  Applied for OC 1 mth ago'  HTN:  reports compliance with Lisinopril 40 mg daily, Coreg 12.5 mg BID and Norvasc 10 mg daily.  Checks BP daily Gives range 120s/80. Limits salt in the foods. No CP/SOB/LE edema  HL:  taking and tolerating Pravachol.  Last LDL 80  HM:  due for flu shot and COVID booster   Patient Active Problem List   Diagnosis Date Noted   Hyperlipidemia associated with type 2 diabetes mellitus ([place]) 11/08/2019   Influenza vaccine needed 01/13/2019   Non-recurrent bilateral inguinal hernia without  obstruction or gangrene 04/23/2018   Hemorrhoids 01/22/2018   Actinic keratosis due to exposure to sunlight 07/21/2017   Lower extremity edema 07/21/2017   Class 3 severe obesity due to excess calories without serious comorbidity with body mass index (BMI) of 50.0 to 59.9 in adult [facility]) 07/21/2017   Hyperlipidemia with target low density lipoprotein (LDL) cholesterol less than 100 mg/dL 11/17/2016   Hypertension 10/31/2016     Current Outpatient Medications on File Prior to Visit  Medication Sig Dispense Refill   amLODipine (NORVASC) 10 MG tablet TAKE 1 TABLET (10 MG TOTAL) BY MOUTH DAILY. 30 tablet 6   aspirin EC 81 MG tablet Take 1 tablet (81 mg total) by mouth daily. 90 tablet 3   Blood Glucose Monitoring Suppl (TRUE METRIX METER) w/Device KIT Use as directed 1 kit 0   carvedilol (COREG) 12.5 MG tablet Take 1 tablet (12.5 mg total) by mouth 2 (two) times daily with a meal. 60 tablet 6   Docusate Sodium (STOOL SOFTENER LAXATIVE PO) Take 100 mg by mouth. Takes 2 to 4 capsules daily as needed.     glucose blood (TRUE METRIX BLOOD GLUCOSE TEST) test strip Use as instructed 100 each 12  hydrochlorothiazide (HYDRODIURIL) 25 MG tablet TAKE 1 TABLET (25 MG TOTAL) BY MOUTH DAILY. 30 tablet 6   lisinopril (ZESTRIL) 40 MG tablet TAKE 1 TABLET (40 MG TOTAL) BY MOUTH DAILY. 30 tablet 6   metFORMIN (GLUCOPHAGE-XR) 500 MG 24 hr tablet Take 1 tablet (500 mg total) by mouth 2 (two) times daily. 60 tablet 6   pravastatin (PRAVACHOL) 40 MG tablet Take 1 tablet (40 mg total) by mouth daily. 30 tablet 6   traMADol (ULTRAM) 50 MG tablet Take 1 tablet (50 mg total) by mouth every 6 (six) hours as needed. (Patient not taking: Reported on 03/31/2022) 15 tablet 0   No current facility-administered medications on file prior to visit.    No Known Allergies  Social History   Socioeconomic History   Marital status: Single    Spouse name: Not on file   Number of children: Not on file   Years of education: Not  on file   Highest education level: Not on file  Occupational History   Not on file  Tobacco Use   Smoking status: Never   Smokeless tobacco: Never  Vaping Use   Vaping Use: Never used  Substance and Sexual Activity   Alcohol use: No   Drug use: No   Sexual activity: Not on file  Other Topics Concern   Not on file  Social History Narrative   Not on file   Social Determinants of Health   Financial Resource Strain: Not on file  Food Insecurity: Not on file  Transportation Needs: Not on file  Physical Activity: Not on file  Stress: Not on file  Social Connections: Not on file  Intimate Partner Violence: Not on file    Family History  Problem Relation Age of Onset   Heart disease Mother    Lung cancer Father    Colon cancer Neg Hx    Esophageal cancer Neg Hx    Liver cancer Neg Hx    Pancreatic cancer Neg Hx    Rectal cancer Neg Hx    Stomach cancer Neg Hx     Past Surgical History:  Procedure Laterality Date   COLONOSCOPY     ~5 yrs ago   FISTULOTOMY N/A 12/12/2021   Procedure: SETON PLACEMENT;  Surgeon: Leighton Ruff, MD;  Location: [facility];  Service: General;  Laterality: N/A;   NO PAST SURGERIES      ROS: Review of Systems Negative except as stated above  PHYSICAL EXAM: BP 132/82   Pulse 82   Temp 98.2 F (36.8 C) (Oral)   Ht _0  (1.702 m)   Wt 291 lb (132 kg)   SpO2 96%   BMI 45.58 kg/m   Wt Readings from Last 3 Encounters:  03/31/22 291 lb (132 kg)  12/12/21 289 lb 8 oz (131.3 kg)  11/28/21 294 lb 12.8 oz (133.7 kg)    Physical Exam  General appearance - alert, well appearing, morbidly obese older male and in no distress Mental status - normal mood, behavior, speech, dress, motor activity, and thought processes Neck - supple, no significant adenopathy Chest - clear to auscultation, no wheezes, rales or rhonchi, symmetric air entry Heart - normal rate, regular rhythm, normal S1, S2, no murmurs, rubs, clicks or  gallops Extremities - peripheral pulses normal, no pedal edema, no clubbing or cyanosis      Latest Ref Rng & Units 12/12/2021   10:33 AM 02/04/2021    2:59 PM 11/11/2019    3:06 PM  CMP  Glucose 70 - 99 mg/dL 138  118  96   BUN 8 - 23 mg/dL _0 Creatinine 0.61 - 1.24 mg/dL 0.70  1.18  0.98   Sodium 135 - 145 mmol/L 139  140  140   Potassium 3.5 - 5.1 mmol/L 3.3  4.1  3.9   Chloride 98 - 111 mmol/L 97  94  101   CO2 20 - 29 mmol/L  26  25   Calcium 8.6 - 10.2 mg/dL  9.6  9.5   Total Protein 6.0 - 8.5 g/dL  7.6  7.3   Total Bilirubin 0.0 - 1.2 mg/dL  0.7  0.4   Alkaline Phos 44 - 121 IU/L  43  46   AST 0 - 40 IU/L  22  20   ALT 0 - 44 IU/L  26  25    Lipid Panel     Component Value Date/Time   CHOL 137 02/04/2021 1459   TRIG 116 02/04/2021 1459   HDL 36 (L) 02/04/2021 1459   CHOLHDL 3.8 02/04/2021 1459   LDLCALC 80 02/04/2021 1459    CBC    Component Value Date/Time   WBC 11.1 (H) 02/04/2021 1459   WBC 12.1 (H) 09/23/2016 0953   RBC 5.02 02/04/2021 1459   RBC 4.66 09/23/2016 0953   HGB 14.3 12/12/2021 1033   HGB 15.3 02/04/2021 1459   HCT 42.0 12/12/2021 1033   HCT 45.6 02/04/2021 1459   PLT 212 02/04/2021 1459   MCV 91 02/04/2021 1459   MCH 30.5 02/04/2021 1459   MCH 29.2 09/23/2016 0953   MCHC 33.6 02/04/2021 1459   MCHC 32.1 09/23/2016 0953   RDW 12.6 02/04/2021 1459    ASSESSMENT AND PLAN:  1. Type 2 diabetes mellitus with morbid obesity (HCC) At goal.  Continue metformin 500 mg twice a day. Discussed and encourage healthy eating habits.  Encouraged him to try to move more. - POCT glucose (manual entry) - POCT glycosylated hemoglobin (Hb A1C) - Microalbumin / creatinine urine ratio  2. Hypertension associated with diabetes ([place]) Close to goal.  Continue current dose of lisinopril, amlodipine and carvedilol.  3. Hyperlipidemia, unspecified hyperlipidemia type Continue pravastatin.  4. Anal fistula Patient will let me know once he has been  approved for the orange card on Medicaid so that I can submit referral to his surgeon Dr. Reeves Forth.  5. Need for immunization against influenza - Flu Vaccine QUAD 29moIM (Fluarix, Fluzone & Alfiuria Quad PF)   Patient was given the opportunity to ask questions.  Patient verbalized understanding of the plan and was able to repeat key elements of the plan.   This documentation was completed using DRadio producer  Any transcriptional errors are unintentional.  Orders Placed This Encounter  Procedures   Flu Vaccine QUAD 640moM (Fluarix, Fluzone & Alfiuria Quad PF)   Microalbumin / creatinine urine ratio   POCT glucose (manual entry)   POCT glycosylated hemoglobin (Hb A1C)     Requested Prescriptions    No prescriptions requested or ordered in this encounter    No follow-ups on file.  DeKarle PlumberMD, FACP

## 2022-04-01 ENCOUNTER — Other Ambulatory Visit: Payer: Self-pay

## 2022-04-01 LAB — MICROALBUMIN / CREATININE URINE RATIO
Creatinine, Urine: 123.7 mg/dL
Microalb/Creat Ratio: 24 mg/g creat (ref 0–29)
Microalbumin, Urine: 29.5 ug/mL

## 2022-04-01 MED ORDER — TRUEPLUS LANCETS 28G MISC
3 refills | Status: DC
  Filled 2022-04-01: qty 100, 25d supply, fill #0

## 2022-04-01 MED ORDER — METFORMIN HCL ER 500 MG PO TB24
500.0000 mg | ORAL_TABLET | Freq: Two times a day (BID) | ORAL | 6 refills | Status: DC
  Filled 2022-04-01: qty 60, 30d supply, fill #0
  Filled 2022-06-27 (×2): qty 60, 30d supply, fill #1
  Filled 2022-08-15: qty 60, 30d supply, fill #2
  Filled 2022-09-16: qty 60, 30d supply, fill #3
  Filled 2022-10-17: qty 60, 30d supply, fill #4
  Filled 2022-11-26: qty 60, 30d supply, fill #5
  Filled 2023-01-15: qty 60, 30d supply, fill #6

## 2022-04-01 MED ORDER — TRUE METRIX BLOOD GLUCOSE TEST VI STRP
ORAL_STRIP | 3 refills | Status: DC
  Filled 2022-04-01: qty 100, 25d supply, fill #0

## 2022-04-01 NOTE — Telephone Encounter (Signed)
Requested Prescriptions  Pending Prescriptions Disp Refills   TRUEplus Lancets 28G MISC 100 each     Sig: USE AS DIRECTED     Endocrinology: Diabetes - Testing Supplies Passed - 03/31/2022  5:11 PM      Passed - Valid encounter within last 12 months    Recent Outpatient Visits           Yesterday Type 2 diabetes mellitus with morbid obesity ([place])   [place] Karle Plumber B, MD   4 months ago Type 2 diabetes mellitus with morbid obesity ([place])   [place] Karle Plumber B, MD   8 months ago Type 2 diabetes mellitus with morbid obesity [facility])   [place], Deborah B, MD   1 year ago Type 2 diabetes mellitus with morbid obesity ([place])   [place], Deborah B, MD   1 year ago Type 2 diabetes mellitus with morbid obesity [facility])   [place], Dionne Bucy, [place]       Future Appointments             In 6 months Ladell Pier, MD [place]             glucose blood (TRUE METRIX BLOOD GLUCOSE TEST) test strip 100 each 12    Sig: Use as instructed     Endocrinology: Diabetes - Testing Supplies Passed - 03/31/2022  5:11 PM      Passed - Valid encounter within last 12 months    Recent Outpatient Visits           Yesterday Type 2 diabetes mellitus with morbid obesity ([place])   [place] Karle Plumber B, MD   4 months ago Type 2 diabetes mellitus with morbid obesity [facility])   [place] Karle Plumber B, MD   8 months ago Type 2 diabetes mellitus with morbid obesity [facility])   [place], Deborah B, MD   1 year ago Type 2 diabetes mellitus with morbid obesity [facility])   [place] Karle Plumber B, MD   1 year ago Type 2 diabetes mellitus  with morbid obesity [facility])   [place], [place], [place]       Future Appointments             In 6 months Ladell Pier, MD [place]             metFORMIN (GLUCOPHAGE-XR) 500 MG 24 hr tablet 60 tablet 6    Sig: Take 1 tablet (500 mg total) by mouth 2 (two) times daily.     Endocrinology:  Diabetes - Biguanides Failed - 03/31/2022  5:11 PM      Failed - eGFR in normal range and within 360 days    GFR calc Af Amer  Date Value Ref Range Status  11/11/2019 96 >59 mL/min/1.73 Final    Comment:    **[facility] currently reports eGFR in compliance with the current**   recommendations of the [employer]. [facility] will   update reporting as new guidelines are published from the NKF-ASN   Task force.    GFR calc non Af Amer  Date Value Ref Range Status  11/11/2019 83 >59 mL/min/1.73 Final  eGFR  Date Value Ref Range Status  02/04/2021 70 >59 mL/min/1.73 Final         Failed - B12 Level in normal range and within 720 days    No results found for: "VITAMINB12"       Failed - CBC within normal limits and completed in the last 12 months    WBC  Date Value Ref Range Status  02/04/2021 11.1 (H) 3.4 - 10.8 x10E3/uL Final  09/23/2016 12.1 (H) 4.0 - 10.5 K/uL Final   RBC  Date Value Ref Range Status  02/04/2021 5.02 4.14 - 5.80 x10E6/uL Final  09/23/2016 4.66 4.22 - 5.81 MIL/uL Final   Hemoglobin  Date Value Ref Range Status  12/12/2021 14.3 13.0 - 17.0 g/dL Final  02/04/2021 15.3 13.0 - 17.7 g/dL Final   HCT  Date Value Ref Range Status  12/12/2021 42.0 39.0 - 52.0 % Final   Hematocrit  Date Value Ref Range Status  02/04/2021 45.6 37.5 - 51.0 % Final   MCHC  Date Value Ref Range Status  02/04/2021 33.6 31.5 - 35.7 g/dL Final  09/23/2016 32.1 30.0 - 36.0 g/dL Final   [facility]  Date Value Ref Range Status  02/04/2021 30.5 26.6 - 33.0 pg Final  09/23/2016 29.2 26.0 - 34.0 pg Final    MCV  Date Value Ref Range Status  02/04/2021 91 79 - 97 fL Final   No results found for: "PLTCOUNTKUC", "LABPLAT", "POCPLA" RDW  Date Value Ref Range Status  02/04/2021 12.6 11.6 - 15.4 % Final         Passed - Cr in normal range and within 360 days    Creatinine, Ser  Date Value Ref Range Status  12/12/2021 0.70 0.61 - 1.24 mg/dL Final   Creatinine, POC  Date Value Ref Range Status  10/31/2016 100 mg/dL Final         Passed - HBA1C is between 0 and 7.9 and within 180 days    HbA1c, POC (prediabetic range)  Date Value Ref Range Status  01/13/2019 6.4 5.7 - 6.4 % Final   HbA1c, POC (controlled diabetic range)  Date Value Ref Range Status  03/31/2022 6.5 0.0 - 7.0 % Final         Passed - Valid encounter within last 6 months    Recent Outpatient Visits           Yesterday Type 2 diabetes mellitus with morbid obesity ([place])   [place], Neoma Laming B, MD   4 months ago Type 2 diabetes mellitus with morbid obesity ([place])   [place] Karle Plumber B, MD   8 months ago Type 2 diabetes mellitus with morbid obesity [facility])   [place], Deborah B, MD   1 year ago Type 2 diabetes mellitus with morbid obesity [facility])   [place], Deborah B, MD   1 year ago Type 2 diabetes mellitus with morbid obesity [facility])   [place], Dionne [place], [place]       Future Appointments             In 6 months Ladell Pier, MD [place]

## 2022-04-01 NOTE — Telephone Encounter (Signed)
Requested medication (s) are due for refill today: yes  Requested medication (s) are on the active medication list: no  Last refill:03/22/20 100 each 4 RF   Future visit scheduled: yes  Notes to clinic:  not an active med- ended 03/12/21   Requested Prescriptions  Pending Prescriptions Disp Refills   TRUEplus Lancets 28G MISC 100 each     Sig: USE AS DIRECTED     Endocrinology: Diabetes - Testing Supplies Passed - 03/31/2022  5:11 PM      Passed - Valid encounter within last 12 months    Recent Outpatient Visits           Yesterday Type 2 diabetes mellitus with morbid obesity ([place])   [place], Neoma Laming B, MD   4 months ago Type 2 diabetes mellitus with morbid obesity ([place])   [place] Karle Plumber B, MD   8 months ago Type 2 diabetes mellitus with morbid obesity ([place])   [place], Deborah B, MD   1 year ago Type 2 diabetes mellitus with morbid obesity ([place])   [place], Deborah B, MD   1 year ago Type 2 diabetes mellitus with morbid obesity [facility])   [place], Dionne Bucy, [place]       Future Appointments             In 6 months Ladell Pier, MD [place]             glucose blood (TRUE METRIX BLOOD GLUCOSE TEST) test strip 100 each 12    Sig: Use as instructed     Endocrinology: Diabetes - Testing Supplies Passed - 03/31/2022  5:11 PM      Passed - Valid encounter within last 12 months    Recent Outpatient Visits           Yesterday Type 2 diabetes mellitus with morbid obesity ([place])   [place] Karle Plumber B, MD   4 months ago Type 2 diabetes mellitus with morbid obesity [facility])   [place] Karle Plumber B, MD   8 months ago Type 2 diabetes mellitus with morbid  obesity [facility])   [employer], Deborah B, MD   1 year ago Type 2 diabetes mellitus with morbid obesity [facility])   [place] Karle Plumber B, MD   1 year ago Type 2 diabetes mellitus with morbid obesity [facility])   [place], [place], [place]       Future Appointments             In 6 months Ladell Pier, MD [place]            Signed Prescriptions Disp Refills   metFORMIN (GLUCOPHAGE-XR) 500 MG 24 hr tablet 60 tablet 6    Sig: Take 1 tablet (500 mg total) by mouth 2 (two) times daily.     Endocrinology:  Diabetes - Biguanides Failed - 03/31/2022  5:11 PM      Failed - eGFR in normal range and within 360 days    GFR calc Af Amer  Date Value Ref Range Status  11/11/2019 96 >59 mL/min/1.73 Final    Comment:    **[facility] currently reports eGFR  in compliance with the current**   recommendations of the [employer]. [facility] will   update reporting as new guidelines are published from the NKF-ASN   Task force.    GFR calc non Af Amer  Date Value Ref Range Status  11/11/2019 83 >59 mL/min/1.73 Final   eGFR  Date Value Ref Range Status  02/04/2021 70 >59 mL/min/1.73 Final         Failed - B12 Level in normal range and within 720 days    No results found for: "VITAMINB12"       Failed - CBC within normal limits and completed in the last 12 months    WBC  Date Value Ref Range Status  02/04/2021 11.1 (H) 3.4 - 10.8 x10E3/uL Final  09/23/2016 12.1 (H) 4.0 - 10.5 K/uL Final   RBC  Date Value Ref Range Status  02/04/2021 5.02 4.14 - 5.80 x10E6/uL Final  09/23/2016 4.66 4.22 - 5.81 MIL/uL Final   Hemoglobin  Date Value Ref Range Status  12/12/2021 14.3 13.0 - 17.0 g/dL Final  02/04/2021 15.3 13.0 - 17.7 g/dL Final   HCT  Date Value Ref Range Status  12/12/2021 42.0 39.0 - 52.0 % Final   Hematocrit  Date Value  Ref Range Status  02/04/2021 45.6 37.5 - 51.0 % Final   MCHC  Date Value Ref Range Status  02/04/2021 33.6 31.5 - 35.7 g/dL Final  09/23/2016 32.1 30.0 - 36.0 g/dL Final   [facility]  Date Value Ref Range Status  02/04/2021 30.5 26.6 - 33.0 pg Final  09/23/2016 29.2 26.0 - 34.0 pg Final   MCV  Date Value Ref Range Status  02/04/2021 91 79 - 97 fL Final   No results found for: "PLTCOUNTKUC", "LABPLAT", "POCPLA" RDW  Date Value Ref Range Status  02/04/2021 12.6 11.6 - 15.4 % Final         Passed - Cr in normal range and within 360 days    Creatinine, Ser  Date Value Ref Range Status  12/12/2021 0.70 0.61 - 1.24 mg/dL Final   Creatinine, POC  Date Value Ref Range Status  10/31/2016 100 mg/dL Final         Passed - HBA1C is between 0 and 7.9 and within 180 days    HbA1c, POC (prediabetic range)  Date Value Ref Range Status  01/13/2019 6.4 5.7 - 6.4 % Final   HbA1c, POC (controlled diabetic range)  Date Value Ref Range Status  03/31/2022 6.5 0.0 - 7.0 % Final         Passed - Valid encounter within last 6 months    Recent Outpatient Visits           Yesterday Type 2 diabetes mellitus with morbid obesity ([place])   [place], Neoma Laming B, MD   4 months ago Type 2 diabetes mellitus with morbid obesity [facility])   [place] Karle Plumber B, MD   8 months ago Type 2 diabetes mellitus with morbid obesity [facility])   [place], Deborah B, MD   1 year ago Type 2 diabetes mellitus with morbid obesity [facility])   [place], Deborah B, MD   1 year ago Type 2 diabetes mellitus with morbid obesity [facility])   [place], Dionne [place], [place]       Future Appointments  In 6 months Ladell Pier, MD [place]

## 2022-04-02 ENCOUNTER — Other Ambulatory Visit: Payer: Self-pay

## 2022-04-03 ENCOUNTER — Other Ambulatory Visit: Payer: Self-pay

## 2022-04-11 ENCOUNTER — Telehealth: Payer: Self-pay | Admitting: Internal Medicine

## 2022-04-11 NOTE — Telephone Encounter (Signed)
Pt has Medicaid and therefore is ineligible for orange card renewal. Has procedure coming up. Will mail CAFA app and eligibility and required docs to pt for future in case receives bill. Also, pt may be ineligible to apply for CAFA for 6 months. Will need to locate applicable note in system.

## 2022-04-14 ENCOUNTER — Other Ambulatory Visit: Payer: Self-pay

## 2022-04-14 ENCOUNTER — Other Ambulatory Visit: Payer: Self-pay | Admitting: Internal Medicine

## 2022-04-14 ENCOUNTER — Other Ambulatory Visit (HOSPITAL_COMMUNITY): Payer: Self-pay

## 2022-04-14 DIAGNOSIS — I152 Hypertension secondary to endocrine disorders: Secondary | ICD-10-CM

## 2022-04-15 ENCOUNTER — Other Ambulatory Visit: Payer: Self-pay | Admitting: Pharmacist

## 2022-04-15 ENCOUNTER — Other Ambulatory Visit: Payer: Self-pay

## 2022-04-15 DIAGNOSIS — I152 Hypertension secondary to endocrine disorders: Secondary | ICD-10-CM

## 2022-04-15 MED ORDER — HYDROCHLOROTHIAZIDE 25 MG PO TABS
ORAL_TABLET | Freq: Every day | ORAL | 6 refills | Status: DC
  Filled 2022-04-15: qty 30, 30d supply, fill #0
  Filled 2022-05-26: qty 30, 30d supply, fill #1
  Filled 2022-06-27: qty 30, 30d supply, fill #2
  Filled 2022-08-15: qty 30, 30d supply, fill #3
  Filled 2022-09-16: qty 30, 30d supply, fill #4
  Filled 2022-10-17: qty 30, 30d supply, fill #5
  Filled 2022-11-26: qty 30, 30d supply, fill #6

## 2022-05-19 ENCOUNTER — Encounter (HOSPITAL_BASED_OUTPATIENT_CLINIC_OR_DEPARTMENT_OTHER): Payer: Self-pay | Admitting: General Surgery

## 2022-05-19 NOTE — Progress Notes (Addendum)
Spoke w/ via phone for pre-op interview--- [place] Lab needs dos----ISTAT,CBG               Lab results------ EKG in Epic dated 12/2021 COVID test -----patient states asymptomatic no test needed Arrive at -------0530 NPO after MN NO Solid Food.   Med rec completed Medications to take morning of surgery -----Coreg and Norvasc Diabetic medication -----NONE AM of surgery Patient instructed no nail polish to be worn day of surgery Patient instructed to bring photo id and insurance card day of surgery Patient aware to have Driver (ride ) / caregiver Regan Lemming   for 24 hours after surgery  Patient Special Instructions ----- Pre-Op special Istructions ----- Patient verbalized understanding of instructions that were given at this phone interview. Patient denies shortness of breath, chest pain, fever, cough at this phone interview.

## 2022-05-21 ENCOUNTER — Encounter (HOSPITAL_BASED_OUTPATIENT_CLINIC_OR_DEPARTMENT_OTHER): Payer: Self-pay | Admitting: General Surgery

## 2022-05-21 NOTE — Anesthesia Preprocedure Evaluation (Addendum)
Anesthesia Evaluation  Patient identified by MRN, date of birth, ID band Patient awake    Reviewed: Allergy & Precautions, NPO status , Patient's Chart, lab work & pertinent test results  Airway Mallampati: III  TM Distance: >3 FB Neck ROM: Full    Dental  (+) Poor Dentition, Missing, Dental Advisory Given, Chipped,    Pulmonary neg pulmonary ROS   breath sounds clear to auscultation       Cardiovascular hypertension, Pt. on medications and Pt. on home beta blockers  Rhythm:Regular Rate:Normal     Neuro/Psych negative neurological ROS  negative psych ROS   GI/Hepatic Neg liver ROS,GERD  ,,  Endo/Other  diabetes, Type 2, Oral Hypoglycemic Agents    Renal/GU negative Renal ROS     Musculoskeletal   Abdominal   Peds  Hematology   Anesthesia Other Findings   Reproductive/Obstetrics                             Anesthesia Physical Anesthesia Plan  ASA: 3  Anesthesia Plan: General   Post-op Pain Management: Toradol IV (intra-op)* and Tylenol PO (pre-op)*   Induction: Intravenous  PONV Risk Score and Plan: 3 and Ondansetron, Dexamethasone and Midazolam  Airway Management Planned: Oral ETT  Additional Equipment: None  Intra-op Plan:   Post-operative Plan: Extubation in OR  Informed Consent: I have reviewed the patients History and Physical, chart, labs and discussed the procedure including the risks, benefits and alternatives for the proposed anesthesia with the patient or authorized representative who has indicated his/her understanding and acceptance.     Dental advisory given  Plan Discussed with: CRNA  Anesthesia Plan Comments:        Anesthesia Quick Evaluation

## 2022-05-22 ENCOUNTER — Ambulatory Visit (HOSPITAL_BASED_OUTPATIENT_CLINIC_OR_DEPARTMENT_OTHER): Payer: Medicaid Other | Admitting: Anesthesiology

## 2022-05-22 ENCOUNTER — Encounter (HOSPITAL_BASED_OUTPATIENT_CLINIC_OR_DEPARTMENT_OTHER)
Admission: RE | Disposition: A | Discharge status: Home / Self Care | Payer: Self-pay | Source: Ambulatory Visit | Attending: General Surgery

## 2022-05-22 ENCOUNTER — Ambulatory Visit (HOSPITAL_BASED_OUTPATIENT_CLINIC_OR_DEPARTMENT_OTHER): Admit: 2022-05-22 | Payer: Self-pay | Admitting: General Surgery

## 2022-05-22 ENCOUNTER — Encounter (HOSPITAL_BASED_OUTPATIENT_CLINIC_OR_DEPARTMENT_OTHER): Payer: Self-pay | Admitting: General Surgery

## 2022-05-22 ENCOUNTER — Other Ambulatory Visit: Payer: Self-pay

## 2022-05-22 ENCOUNTER — Encounter (HOSPITAL_BASED_OUTPATIENT_CLINIC_OR_DEPARTMENT_OTHER): Payer: Self-pay

## 2022-05-22 ENCOUNTER — Ambulatory Visit (HOSPITAL_BASED_OUTPATIENT_CLINIC_OR_DEPARTMENT_OTHER)
Admission: RE | Admit: 2022-05-22 | Discharge: 2022-05-22 | Disposition: A | Discharge status: Home / Self Care | Payer: Medicaid Other | Source: Ambulatory Visit | Attending: General Surgery | Admitting: General Surgery

## 2022-05-22 DIAGNOSIS — K219 Gastro-esophageal reflux disease without esophagitis: Secondary | ICD-10-CM | POA: Diagnosis not present

## 2022-05-22 DIAGNOSIS — E119 Type 2 diabetes mellitus without complications: Secondary | ICD-10-CM | POA: Diagnosis not present

## 2022-05-22 DIAGNOSIS — I10 Essential (primary) hypertension: Secondary | ICD-10-CM

## 2022-05-22 DIAGNOSIS — Z7984 Long term (current) use of oral hypoglycemic drugs: Secondary | ICD-10-CM

## 2022-05-22 DIAGNOSIS — K603 Anal fistula: Secondary | ICD-10-CM | POA: Diagnosis not present

## 2022-05-22 HISTORY — PX: LIGATION OF INTERNAL FISTULA TRACT: SHX6551

## 2022-05-22 LAB — POCT I-STAT, CHEM 8
BUN: 12 mg/dL (ref 8–23)
Calcium, Ion: 1.22 mmol/L (ref 1.15–1.40)
Chloride: 98 mmol/L (ref 98–111)
Creatinine, Ser: 0.8 mg/dL (ref 0.61–1.24)
Glucose, Bld: 134 mg/dL — ABNORMAL HIGH (ref 70–99)
HCT: 45 % (ref 39.0–52.0)
Hemoglobin: 15.3 g/dL (ref 13.0–17.0)
Potassium: 3.6 mmol/L (ref 3.5–5.1)
Sodium: 138 mmol/L (ref 135–145)
TCO2: 28 mmol/L (ref 22–32)

## 2022-05-22 LAB — GLUCOSE, CAPILLARY: Glucose-Capillary: 159 mg/dL — ABNORMAL HIGH (ref 70–99)

## 2022-05-22 SURGERY — LIGATION, INTERNAL FISTULA TRACT
Anesthesia: General

## 2022-05-22 SURGERY — LIGATION, INTERNAL FISTULA TRACT
Anesthesia: General | Site: Rectum

## 2022-05-22 MED ORDER — ONDANSETRON HCL 4 MG/2ML IJ SOLN
INTRAMUSCULAR | Status: AC
  Filled 2022-05-22: qty 2

## 2022-05-22 MED ORDER — EPHEDRINE 5 MG/ML INJ
INTRAVENOUS | Status: AC
  Filled 2022-05-22: qty 10

## 2022-05-22 MED ORDER — MIDAZOLAM HCL 2 MG/2ML IJ SOLN
INTRAMUSCULAR | Status: AC
  Filled 2022-05-22: qty 2

## 2022-05-22 MED ORDER — FENTANYL CITRATE (PF) 100 MCG/2ML IJ SOLN
INTRAMUSCULAR | Status: AC
  Filled 2022-05-22: qty 2

## 2022-05-22 MED ORDER — LACTATED RINGERS IV SOLN
INTRAVENOUS | Status: DC
Start: 2022-05-22 — End: 2022-05-22

## 2022-05-22 MED ORDER — DEXAMETHASONE SODIUM PHOSPHATE 10 MG/ML IJ SOLN
INTRAMUSCULAR | Status: DC | PRN
  Administered 2022-05-22: 4 mg via INTRAVENOUS

## 2022-05-22 MED ORDER — FENTANYL CITRATE (PF) 100 MCG/2ML IJ SOLN
INTRAMUSCULAR | Status: DC | PRN
  Administered 2022-05-22 (×2): 25 ug via INTRAVENOUS
  Administered 2022-05-22: 50 ug via INTRAVENOUS

## 2022-05-22 MED ORDER — LIDOCAINE 2% (20 MG/ML) 5 ML SYRINGE
INTRAMUSCULAR | Status: DC | PRN
  Administered 2022-05-22: 50 mg via INTRAVENOUS

## 2022-05-22 MED ORDER — BUPIVACAINE LIPOSOME 1.3 % IJ SUSP
INTRAMUSCULAR | Status: DC | PRN
  Administered 2022-05-22: 20 mL

## 2022-05-22 MED ORDER — PHENYLEPHRINE 80 MCG/ML (10ML) SYRINGE FOR IV PUSH (FOR BLOOD PRESSURE SUPPORT)
PREFILLED_SYRINGE | INTRAVENOUS | Status: DC | PRN
  Administered 2022-05-22 (×2): 80 ug via INTRAVENOUS
  Administered 2022-05-22: 160 ug via INTRAVENOUS
  Administered 2022-05-22: 80 ug via INTRAVENOUS

## 2022-05-22 MED ORDER — SUGAMMADEX SODIUM 200 MG/2ML IV SOLN
INTRAVENOUS | Status: DC | PRN
  Administered 2022-05-22: 200 mg via INTRAVENOUS

## 2022-05-22 MED ORDER — KETAMINE HCL 50 MG/5ML IJ SOSY
PREFILLED_SYRINGE | INTRAMUSCULAR | Status: AC
  Filled 2022-05-22: qty 5

## 2022-05-22 MED ORDER — OXYCODONE HCL 5 MG PO TABS
5.0000 mg | ORAL_TABLET | Freq: Four times a day (QID) | ORAL | 0 refills | Status: DC | PRN
  Filled 2022-05-22: qty 30, 4d supply, fill #0

## 2022-05-22 MED ORDER — ROCURONIUM BROMIDE 10 MG/ML (PF) SYRINGE
PREFILLED_SYRINGE | INTRAVENOUS | Status: AC
  Filled 2022-05-22: qty 10

## 2022-05-22 MED ORDER — MIDAZOLAM HCL 5 MG/5ML IJ SOLN
INTRAMUSCULAR | Status: DC | PRN
  Administered 2022-05-22: 2 mg via INTRAVENOUS

## 2022-05-22 MED ORDER — ROCURONIUM BROMIDE 10 MG/ML (PF) SYRINGE
PREFILLED_SYRINGE | INTRAVENOUS | Status: DC | PRN
  Administered 2022-05-22: 10 mg via INTRAVENOUS
  Administered 2022-05-22: 60 mg via INTRAVENOUS
  Administered 2022-05-22: 10 mg via INTRAVENOUS

## 2022-05-22 MED ORDER — DEXAMETHASONE SODIUM PHOSPHATE 10 MG/ML IJ SOLN
INTRAMUSCULAR | Status: AC
  Filled 2022-05-22: qty 1

## 2022-05-22 MED ORDER — ACETAMINOPHEN 500 MG PO TABS
1000.0000 mg | ORAL_TABLET | ORAL | Status: AC
  Administered 2022-05-22: 1000 mg via ORAL

## 2022-05-22 MED ORDER — PROPOFOL 10 MG/ML IV BOLUS
INTRAVENOUS | Status: AC
  Filled 2022-05-22: qty 20

## 2022-05-22 MED ORDER — ACETAMINOPHEN 500 MG PO TABS
ORAL_TABLET | ORAL | Status: AC
  Filled 2022-05-22: qty 2

## 2022-05-22 MED ORDER — BUPIVACAINE LIPOSOME 1.3 % IJ SUSP: 20.0000 mL | Freq: Once | INTRAMUSCULAR | Status: DC

## 2022-05-22 MED ORDER — PHENYLEPHRINE 80 MCG/ML (10ML) SYRINGE FOR IV PUSH (FOR BLOOD PRESSURE SUPPORT)
PREFILLED_SYRINGE | INTRAVENOUS | Status: AC
  Filled 2022-05-22: qty 10

## 2022-05-22 MED ORDER — ONDANSETRON HCL 4 MG/2ML IJ SOLN
INTRAMUSCULAR | Status: DC | PRN
  Administered 2022-05-22: 4 mg via INTRAVENOUS

## 2022-05-22 MED ORDER — SODIUM CHLORIDE 0.9% FLUSH: 3.0000 mL | Freq: Two times a day (BID) | INTRAVENOUS | Status: DC

## 2022-05-22 MED ORDER — LIDOCAINE HCL (PF) 2 % IJ SOLN
INTRAMUSCULAR | Status: AC
  Filled 2022-05-22: qty 5

## 2022-05-22 MED ORDER — BUPIVACAINE-EPINEPHRINE 0.5% -1:200000 IJ SOLN
INTRAMUSCULAR | Status: DC | PRN
  Administered 2022-05-22: 30 mL

## 2022-05-22 MED ORDER — PROPOFOL 10 MG/ML IV BOLUS
INTRAVENOUS | Status: DC | PRN
  Administered 2022-05-22: 150 mg via INTRAVENOUS

## 2022-05-22 MED ORDER — EPHEDRINE SULFATE-NACL 50-0.9 MG/10ML-% IV SOSY
PREFILLED_SYRINGE | INTRAVENOUS | Status: DC | PRN
  Administered 2022-05-22: 15 mg via INTRAVENOUS
  Administered 2022-05-22: 10 mg via INTRAVENOUS
  Administered 2022-05-22: 5 mg via INTRAVENOUS

## 2022-05-22 SURGICAL SUPPLY — 64 items
APL SKNCLS STERI-STRIP NONHPOA (GAUZE/BANDAGES/DRESSINGS) ×2
BENZOIN TINCTURE PRP APPL 2/3 (GAUZE/BANDAGES/DRESSINGS) ×3 IMPLANT
BLADE EXTENDED COATED 6.5IN (ELECTRODE) IMPLANT
BLADE SURG 10 STRL SS (BLADE) IMPLANT
BRIEF MESH DISP LRG (UNDERPADS AND DIAPERS) ×2 IMPLANT
COVER BACK TABLE 60X90IN (DRAPES) ×2 IMPLANT
COVER MAYO STAND STRL (DRAPES) ×2 IMPLANT
DRAPE LAPAROTOMY 100X72 PEDS (DRAPES) ×2 IMPLANT
DRAPE UTILITY XL STRL (DRAPES) ×2 IMPLANT
ELECT BLADE TIP CTD 4 INCH (ELECTRODE) ×2 IMPLANT
ELECT REM PT RETURN 9FT ADLT (ELECTROSURGICAL) ×2
ELECTRODE REM PT RTRN 9FT ADLT (ELECTROSURGICAL) ×2 IMPLANT
GAUZE 4X4 16PLY ~~LOC~~+RFID DBL (SPONGE) ×2 IMPLANT
GAUZE PAD ABD 8X10 STRL (GAUZE/BANDAGES/DRESSINGS) ×2 IMPLANT
GAUZE SPONGE 4X4 12PLY STRL (GAUZE/BANDAGES/DRESSINGS) ×2 IMPLANT
GLOVE BIO SURGEON STRL SZ 6.5 (GLOVE) ×2 IMPLANT
GLOVE BIOGEL PI IND STRL 7.0 (GLOVE) ×3 IMPLANT
GLOVE BIOGEL PI IND STRL 7.5 (GLOVE) ×1 IMPLANT
GLOVE INDICATOR 6.5 STRL GRN (GLOVE) ×2 IMPLANT
GLOVE INDICATOR 7.0 STRL GRN (GLOVE) ×2 IMPLANT
GLOVE SURG SS PI 6.5 STRL IVOR (GLOVE) ×1 IMPLANT
GLOVE SURG SS PI 7.5 STRL IVOR (GLOVE) ×1 IMPLANT
GOWN STRL REUS W/TWL LRG LVL3 (GOWN DISPOSABLE) ×2 IMPLANT
GOWN STRL REUS W/TWL XL LVL3 (GOWN DISPOSABLE) ×2 IMPLANT
HYDROGEN PEROXIDE 16OZ (MISCELLANEOUS) ×2 IMPLANT
IV CATH 14GX2 1/4 (CATHETERS) IMPLANT
IV CATH 18G SAFETY (IV SOLUTION) ×2 IMPLANT
KIT SIGMOIDOSCOPE (SET/KITS/TRAYS/PACK) IMPLANT
KIT TURNOVER CYSTO (KITS) ×2 IMPLANT
LUBRICANT JELLY K Y 4OZ (MISCELLANEOUS) ×2 IMPLANT
NEEDLE HYPO 22GX1.5 SAFETY (NEEDLE) ×2 IMPLANT
NS IRRIG 500ML POUR BTL (IV SOLUTION) ×2 IMPLANT
PACK BASIN DAY SURGERY FS (CUSTOM PROCEDURE TRAY) ×2 IMPLANT
PAD ARMBOARD 7.5X6 YLW CONV (MISCELLANEOUS) IMPLANT
PENCIL SMOKE EVACUATOR (MISCELLANEOUS) ×2 IMPLANT
RETRACTOR STAY HOOK 5MM (MISCELLANEOUS) ×1 IMPLANT
RETRACTOR STERILE 25.8CMX11.3 (INSTRUMENTS) ×1 IMPLANT
SPIKE FLUID TRANSFER (MISCELLANEOUS) ×1 IMPLANT
SPONGE SURGIFOAM ABS GEL 12-7 (HEMOSTASIS) IMPLANT
SUCTION FRAZIER HANDLE 10FR (MISCELLANEOUS)
SUCTION TUBE FRAZIER 10FR DISP (MISCELLANEOUS) IMPLANT
SUT CHROMIC 2 0 SH (SUTURE) ×4 IMPLANT
SUT CHROMIC 3 0 SH 27 (SUTURE) ×4 IMPLANT
SUT GUT CHROMIC 3 0 (SUTURE) ×4 IMPLANT
SUT SILK 2 0 (SUTURE) ×2
SUT SILK 2 0 SH CR/8 (SUTURE) ×2 IMPLANT
SUT SILK 2-0 18XBRD TIE 12 (SUTURE) ×2 IMPLANT
SUT SILK 3 0 SH 30 (SUTURE) IMPLANT
SUT SILK 3 0 SH CR/8 (SUTURE) IMPLANT
SUT VIC AB 2-0 SH 27 (SUTURE)
SUT VIC AB 2-0 SH 27XBRD (SUTURE) IMPLANT
SUT VIC AB 3-0 SH 18 (SUTURE) IMPLANT
SUT VIC AB 3-0 SH 27 (SUTURE) ×4
SUT VIC AB 3-0 SH 27X BRD (SUTURE) IMPLANT
SUT VIC AB 3-0 SH 27XBRD (SUTURE) ×4 IMPLANT
SUT VIC AB 3-0 SH 8-18 (SUTURE) ×2 IMPLANT
SUT VICRYL 3 0 UR 6 27 (SUTURE) IMPLANT
SYR 10ML LL (SYRINGE) ×2 IMPLANT
SYR BULB IRRIG 60ML STRL (SYRINGE) IMPLANT
SYR CONTROL 10ML LL (SYRINGE) ×2 IMPLANT
TOWEL OR 17X26 10 PK STRL BLUE (TOWEL DISPOSABLE) ×2 IMPLANT
TRAY DSU PREP LF (CUSTOM PROCEDURE TRAY) ×2 IMPLANT
TUBE CONNECTING 12X1/4 (SUCTIONS) ×2 IMPLANT
YANKAUER SUCT BULB TIP NO VENT (SUCTIONS) ×2 IMPLANT

## 2022-05-22 NOTE — H&P (Signed)
PROVIDER:  Monico Blitz, MD   MRN: [id number] DOB: 07-Oct-1960 DATE OF ENCOUNTER: 03/11/2022 Interval History:  Patient presented to the office with approximately 67-month history of draining perianal lesion with pain.  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  A seton was placed.       Past Medical History:  Diagnosis Date   Bilateral inguinal hernia      per 09-28-2027 ct abdomen with contrast epic   Diabetes ([place]) Type 2     GERD (gastroesophageal reflux disease)     Hemorrhoids     Hyperlipidemia     Hypertension           Past Surgical History:  Procedure Laterality Date   COLONOSCOPY        ~5 yrs ago   FISTULOTOMY N/A 12/12/2021    Procedure: SETON PLACEMENT;  Surgeon: Leighton Ruff, MD;  Location: [facility];  Service: General;  Laterality: N/A;   NO PAST SURGERIES             Family History  Problem Relation Age of Onset   Heart disease Mother     Lung cancer Father     Colon cancer Neg Hx     Esophageal cancer Neg Hx     Liver cancer Neg Hx     Pancreatic cancer Neg Hx     Rectal cancer Neg Hx     Stomach cancer Neg Hx      Social History         Socioeconomic History   Marital status: Single      Spouse name: Not on file   Number of children: Not on file   Years of education: Not on file   Highest education level: Not on file  Occupational History   Not on file  Tobacco Use   Smoking status: Never   Smokeless tobacco: Never  Vaping Use   Vaping Use: Never used  Substance and Sexual Activity   Alcohol use: No   Drug use: No   Sexual activity: Not on file  Other Topics Concern   Not on file  Social History Narrative   Not on file    Social Determinants of Health    Financial Resource Strain: Not on file  Food Insecurity: Not on file  Transportation Needs: Not on  file  Physical Activity: Not on file  Stress: Not on file  Social Connections: Not on file  Intimate Partner Violence: Not on file      Current Outpatient Medications:    amLODipine (NORVASC) 10 MG tablet, TAKE 1 TABLET (10 MG TOTAL) BY MOUTH DAILY., Disp: 30 tablet, Rfl: 6   aspirin EC 81 MG tablet, Take 1 tablet (81 mg total) by mouth daily., Disp: 90 tablet, Rfl: 3   Blood Glucose Monitoring Suppl (TRUE METRIX METER) w/Device KIT, Use as directed, Disp: 1 kit, Rfl: 0   carvedilol (COREG) 12.5 MG tablet, Take 1 tablet (12.5 mg total) by mouth 2 (two) times daily with a meal., Disp: 60 tablet, Rfl: 6   Docusate Sodium (STOOL SOFTENER LAXATIVE PO), Take 100 mg by mouth. Takes 2 to 4 capsules daily as needed., Disp: , Rfl:    glucose blood (TRUE METRIX BLOOD GLUCOSE TEST) test strip, Use as instructed, Disp: 100 each, Rfl: 12   hydrochlorothiazide (HYDRODIURIL)  25 MG tablet, TAKE 1 TABLET (25 MG TOTAL) BY MOUTH DAILY., Disp: 30 tablet, Rfl: 6   lisinopril (ZESTRIL) 40 MG tablet, TAKE 1 TABLET (40 MG TOTAL) BY MOUTH DAILY., Disp: 30 tablet, Rfl: 6   metFORMIN (GLUCOPHAGE-XR) 500 MG 24 hr tablet, Take 1 tablet (500 mg total) by mouth 2 (two) times daily., Disp: 60 tablet, Rfl: 6   pravastatin (PRAVACHOL) 40 MG tablet, Take 1 tablet (40 mg total) by mouth daily., Disp: 30 tablet, Rfl: 6   traMADol (ULTRAM) 50 MG tablet, Take 1 tablet (50 mg total) by mouth every 6 (six) hours as needed., Disp: 15 tablet, Rfl: 0 No Known Allergies Review of Systems - Negative except as stated above     Physical Examination:   Blood pressure 125/73, pulse 73, temperature 98 F (36.7 C), temperature source Oral, resp. rate 17, height 5\' 7"  (1.702 m), weight 135.9 kg, SpO2 99 %.  Gen: NAD CV: RRR Lungs: CTA Incision: Seton in place, ant midline   Assessment and Plan:    Scott Mcmillan is a 62 y.o. male who underwent seton placement for a transsphincteric anterior anal fistula.  Patient has a anterior fistula.   We discussed performing a lift procedure with a mucosal advancement flap internally to hopefully help prevent this from recurring.  We discussed a recurrence rate of approximately 20%.  Other risks include bleeding, pain and a small chance of incontinence.  All questions were answered.   There are no diagnoses linked to this encounter.     No follow-ups on file.     The plan was discussed in detail with the patient today, who expressed understanding.  The patient has my contact information, and understands to call me with any additional questions or concerns in the interval.  I would be happy to see the patient back sooner if the need arises.    Rosario Adie, MD Colon and Rectal Surgery

## 2022-05-22 NOTE — Anesthesia Postprocedure Evaluation (Signed)
Anesthesia Post Note  Patient: Scott Mcmillan  Procedure(s) Performed: LIGATION OF INTERNAL FISTULA TRACT (Rectum)     Patient location during evaluation: PACU Anesthesia Type: General Level of consciousness: awake and alert Pain management: pain level controlled Vital Signs Assessment: post-procedure vital signs reviewed and stable Respiratory status: spontaneous breathing, nonlabored ventilation, respiratory function stable and patient connected to nasal cannula oxygen Cardiovascular status: blood pressure returned to baseline and stable Postop Assessment: no apparent nausea or vomiting Anesthetic complications: no  No notable events documented.  Last Vitals:  Vitals:   05/22/22 0900 05/22/22 0913  BP: 127/87 135/83  Pulse: 77 72  Resp: 18   Temp:  36.5 C  SpO2: 93% 95%    Last Pain:  Vitals:   05/22/22 0900  TempSrc:   PainSc: 0-No pain                 Effie Berkshire

## 2022-05-22 NOTE — Anesthesia Procedure Notes (Signed)
Procedure Name: Intubation Date/Time: 05/22/2022 7:38 AM  Performed by: Rogers Blocker, CRNAPre-anesthesia Checklist: Patient identified, Emergency Drugs available, Suction available and Patient being monitored Patient Re-evaluated:Patient Re-evaluated prior to induction Oxygen Delivery Method: Circle System Utilized Preoxygenation: Pre-oxygenation with 100% oxygen Induction Type: IV induction Ventilation: Oral airway inserted - appropriate to patient size Laryngoscope Size: Mac and 4 Grade View: Grade I Tube type: Oral Tube size: 7.0 mm Number of attempts: 1 Airway Equipment and Method: Stylet and Bite block Placement Confirmation: ETT inserted through vocal cords under direct vision, positive ETCO2 and breath sounds checked- equal and bilateral Secured at: 22 cm Tube secured with: Tape Dental Injury: Teeth and Oropharynx as per pre-operative assessment

## 2022-05-22 NOTE — Discharge Instructions (Addendum)
ANORECTAL SURGERY: POST OP INSTRUCTIONS Take your usually prescribed home medications unless otherwise directed. DIET: During the first few hours after surgery sip on some liquids until you are able to urinate.  It is normal to not urinate for several hours after this surgery.  If you feel uncomfortable, please contact the office for instructions.  After you are able to urinate,you may eat, if you feel like it.  Follow a light bland diet the first 24 hours after arrival home, such as soup, liquids, crackers, etc.  Be sure to include lots of fluids daily (6-8 glasses).  Avoid fast food or heavy meals, as your are more likely to get nauseated.  Eat a low fat diet the next few days after surgery.  Limit caffeine intake to 1-2 servings a day. PAIN CONTROL: Pain is best controlled by a usual combination of several different methods TOGETHER: Muscle relaxation: Soak in a warm bath (or Sitz bath) three times a day and after bowel movements.  Continue to do this until all pain is resolved. Over the counter pain medication Prescription pain medication Most patients will experience some swelling and discomfort in the anus/rectal area and incisions.  Heat such as warm towels, sitz baths, warm baths, etc to help relax tight/sore spots and speed recovery.  Some people prefer to use ice, especially in the first couple days after surgery, as it may decrease the pain and swelling, or alternate between ice & heat.  Experiment to what works for you.  Swelling and bruising can take several weeks to resolve.  Pain can take even longer to completely resolve. It is helpful to take an over-the-counter pain medication regularly for the first few weeks.  Choose one of the following that works best for you: Naproxen (Aleve, etc)  Two 220mg tabs twice a day Ibuprofen (Advil, etc) Three 200mg tabs four times a day (every meal & bedtime) A  prescription for pain medication (such as percocet, oxycodone, hydrocodone, etc) should be  given to you upon discharge.  Take your pain medication as prescribed.  If you are having problems/concerns with the prescription medicine (does not control pain, nausea, vomiting, rash, itching, etc), please call us [phone] to see if we need to switch you to a different pain medicine that will work better for you and/or control your side effect better. If you need a refill on your pain medication, please contact your pharmacy.  They will contact our office to request authorization. Prescriptions will not be filled after 5 pm or on week-ends. KEEP YOUR BOWELS REGULAR and AVOID CONSTIPATION The goal is one to two soft bowel movements a day.  You should at least have a bowel movement every other day. Avoid getting constipated.  Between the surgery and the pain medications, it is common to experience some constipation. This can be very painful after rectal surgery.  Increasing fluid intake and taking a fiber supplement (such as Metamucil, Citrucel, FiberCon, etc) 1-2 times a day regularly will usually help prevent this problem from occurring.  A stool softener like colace is also recommended.  This can be purchased over the counter at your pharmacy.  You can take it up to 3 times a day.  If you do not have a bowel movement after 24 hrs since your surgery, take one does of milk of magnesia.  If you still haven't had a bowel movement 8-12 hours after that dose, take another dose.  If you don't have a bowel movement 48 hrs after surgery,   purchase a Fleets enema from the drug store and administer gently per package instructions.  If you still are having trouble with your bowel movements after that, please call the office for further instructions. If you develop diarrhea or have many loose bowel movements, simplify your diet to bland foods & liquids for a few days.  Stop any stool softeners and decrease your fiber supplement.  Switching to mild anti-diarrheal medications (Kayopectate, Pepto Bismol) can help.   If this worsens or does not improve, please call us.  Wound Care Remove your bandages before your first bowel movement or 8 hours after surgery.     Remove any wound packing material at this tim,e as well.  You do not need to repack the wound unless instructed otherwise.  Wear an absorbent pad or soft cotton gauze in your underwear to catch any drainage and help keep the area clean. You should change this every 2-3 hours while awake. Keep the area clean and dry.  Bathe / shower every day, especially after bowel movements.  Keep the area clean by showering / bathing over the incision / wound.   It is okay to soak an open wound to help wash it.  Wet wipes or showers / gentle washing after bowel movements is often less traumatic than regular toilet paper. You may have some styrofoam-like soft packing in the rectum which will come out with the first bowel movement.  You will often notice bleeding with bowel movements.  This should slow down by the end of the first week of surgery Expect some drainage.  This should slow down, too, by the end of the first week of surgery.  Wear an absorbent pad or soft cotton gauze in your underwear until the drainage stops. Do Not sit on a rubber or pillow ring.  This can make you symptoms worse.  You may sit on a soft pillow if needed.  ACTIVITIES as tolerated:   You may resume regular (light) daily activities beginning the next day--such as daily self-care, walking, climbing stairs--gradually increasing activities as tolerated.  If you can walk 30 minutes without difficulty, it is safe to try more intense activity such as jogging, treadmill, bicycling, low-impact aerobics, swimming, etc. Save the most intensive and strenuous activity for last such as sit-ups, heavy lifting, contact sports, etc  Refrain from any heavy lifting or straining until you are off narcotics for pain control.   You may drive when you are no longer taking prescription pain medication, you can  comfortably sit for long periods of time, and you can safely maneuver your car and apply brakes. You may have sexual intercourse when it is comfortable.  FOLLOW UP in our office Please call [facility] at [phone] to set up an appointment to see your surgeon in the office for a follow-up appointment approximately 3-4 weeks after your surgery. Make sure that you call for this appointment the day you arrive home to insure a convenient appointment time. 10. IF YOU HAVE DISABILITY OR FAMILY LEAVE FORMS, BRING THEM TO THE OFFICE FOR PROCESSING.  DO NOT GIVE THEM TO YOUR DOCTOR.     WHEN TO CALL us [phone]: Poor pain control Reactions / problems with new medications (rash/itching, nausea, etc)  Fever over 101.5 F (38.5 C) Inability to urinate Nausea and/or vomiting Worsening swelling or bruising Continued bleeding from incision. Increased pain, redness, or drainage from the incision  The clinic staff is available to answer your questions during regular business hours (8:30am-5pm).  Please don't hesitate to call and ask to speak to one of our nurses for clinical concerns.   A surgeon from [facility] is always on call at the hospitals   If you have a medical emergency, go to the nearest emergency room or call 911.    [facility], [place], [place], [place], [place]  [address] ? MAIN: [phone] ? TOLL FREE: [phone] ? FAX [phone] [email or web address]     No acetaminophen/Tylenol until after 12:00 pm today if needed.    Post Anesthesia Home Care Instructions  Activity: Get plenty of rest for the remainder of the day. A responsible individual must stay with you for 24 hours following the procedure.  For the next 24 hours, DO NOT: -Drive a car -Paediatric nurse -Drink alcoholic beverages -Take any medication unless instructed by your physician -Make any legal decisions or sign important  papers.  Meals: Start with liquid foods such as gelatin or soup. Progress to regular foods as tolerated. Avoid greasy, spicy, heavy foods. If nausea and/or vomiting occur, drink only clear liquids until the nausea and/or vomiting subsides. Call your physician if vomiting continues.  Special Instructions/Symptoms: Your throat may feel dry or sore from the anesthesia or the breathing tube placed in your throat during surgery. If this causes discomfort, gargle with warm salt water. The discomfort should disappear within 24 hours.    Information for Discharge Teaching: EXPAREL (bupivacaine liposome injectable suspension)   Your surgeon or anesthesiologist gave you EXPAREL(bupivacaine) to help control your pain after surgery.  EXPAREL is a local anesthetic that provides pain relief by numbing the tissue around the surgical site. EXPAREL is designed to release pain medication over time and can control pain for up to 72 hours. Depending on how you respond to EXPAREL, you may require less pain medication during your recovery.  Possible side effects: Temporary loss of sensation or ability to move in the area where bupivacaine was injected. Nausea, vomiting, constipation Rarely, numbness and tingling in your mouth or lips, lightheadedness, or anxiety may occur. Call your doctor right away if you think you may be experiencing any of these sensations, or if you have other questions regarding possible side effects.  Follow all other discharge instructions given to you by your surgeon or nurse. Eat a healthy diet and drink plenty of water or other fluids.  If you return to the hospital for any reason within 96 hours following the administration of EXPAREL, it is important for health care providers to know that you have received this anesthetic. A teal colored band has been placed on your arm with the date, time and amount of EXPAREL you have received in order to alert and inform your health care  providers. Please leave this armband in place for the full 96 hours following administration, and then you may remove the band.

## 2022-05-22 NOTE — Op Note (Signed)
05/22/2022  8:29 AM  PATIENT:  Scott Mcmillan  62 y.o. male  Patient Care Team: Ladell Pier, MD as PCP - General (Internal Medicine)  PRE-OPERATIVE DIAGNOSIS:  ANAL FISTULA  POST-OPERATIVE DIAGNOSIS:  ANAL FISTULA  PROCEDURE:   LIGATION OF INTERNAL FISTULA TRACT   SURGEON:  Surgeon(s): Leighton Ruff, MD  ASSISTANT: none   ANESTHESIA:   local and general  SPECIMEN:  No Specimen  DISPOSITION OF SPECIMEN:  N/A  COUNTS:  YES  PLAN OF CARE: Discharge to home after PACU  PATIENT DISPOSITION:  PACU - hemodynamically stable.  INDICATION: 62 y.o. M with Trans-sphincteric anal fistula   OR FINDINGS: Transfer enteric anal fistula with seton in place  DESCRIPTION: the patient was identified in the preoperative holding area and taken to the OR where they were laid on the operating room table.  General anesthesia was induced without difficulty. The patient was then positioned in prone jackknife position with buttocks gently taped apart.  The patient was then prepped and draped in usual sterile fashion.  SCDs were noted to be in place prior to the initiation of anesthesia. A surgical timeout was performed indicating the correct patient, procedure, positioning and need for preoperative antibiotics.  A rectal block was performed using Marcaine with epinephrine next with Exparel.    I began with a digital rectal exam.  Delphina Cahill was in place with an internal opening at anterior midline. I then placed a Hill-Ferguson anoscope into the anal canal and evaluated this completely.  An incision was made over the intersphincteric groove and dissection was carried down bluntly.  I placed a fistula probe in the anal fistula and remove the seton.  A [place] retractor was used for retraction of the wound.  A right angle clamp was used to dissect underneath the anal fistula.  Once this was complete it was tied off with 2, 2-0 silk sutures.  It was then transected and the internal flap was freed to  create a mini mucosal advancement flap.  This was then sutured to the anoderm using interrupted 2-0 chromic sutures.  The intersphincteric space was closed with a couple 3-0 Vicryl sutures.  The remaining endoderm was reapproximated using interrupted 2-0 chromic sutures.  Marcaine was applied around the incision for added anesthesia afterwards.  Dressing was applied.  Patient was awakened from anesthesia and sent to the postanesthesia care unit in stable condition.  All counts were correct per operating room staff.  Rosario Adie, MD  Col[facility] and [facility]

## 2022-05-22 NOTE — Transfer of Care (Signed)
Immediate Anesthesia Transfer of Care Note  Patient: Scott Mcmillan  Procedure(s) Performed: LIGATION OF INTERNAL FISTULA TRACT (Rectum)  Patient Location: PACU  Anesthesia Type:General  Level of Consciousness: drowsy, patient cooperative, and responds to stimulation  Airway & Oxygen Therapy: Patient Spontanous Breathing and Patient connected to face mask oxygen  Post-op Assessment: Report given to RN, Post -op Vital signs reviewed and stable, and Patient moving all extremities X 4  Post vital signs: Reviewed and stable  Last Vitals:  Vitals Value Taken Time  BP 137/90 05/22/22 0842  Temp 36.6 C 05/22/22 0841  Pulse 78 05/22/22 0845  Resp 14 05/22/22 0845  SpO2 99 % 05/22/22 0845  Vitals shown include unvalidated device data.  Last Pain:  Vitals:   05/22/22 0555  TempSrc: Oral  PainSc: 0-No pain      Patients Stated Pain Goal: 5 (58/85/02 7741)  Complications: No notable events documented.

## 2022-05-23 ENCOUNTER — Encounter (HOSPITAL_BASED_OUTPATIENT_CLINIC_OR_DEPARTMENT_OTHER): Payer: Self-pay | Admitting: General Surgery

## 2022-05-26 ENCOUNTER — Other Ambulatory Visit: Payer: Self-pay

## 2022-05-26 ENCOUNTER — Other Ambulatory Visit: Payer: Self-pay | Admitting: Internal Medicine

## 2022-05-26 DIAGNOSIS — E785 Hyperlipidemia, unspecified: Secondary | ICD-10-CM

## 2022-05-27 ENCOUNTER — Other Ambulatory Visit: Payer: Self-pay

## 2022-05-27 MED ORDER — PRAVASTATIN SODIUM 40 MG PO TABS
40.0000 mg | ORAL_TABLET | Freq: Every day | ORAL | 6 refills | Status: DC
  Filled 2022-05-27: qty 30, 30d supply, fill #0
  Filled 2022-06-27: qty 30, 30d supply, fill #1
  Filled 2022-08-15: qty 30, 30d supply, fill #2
  Filled 2022-09-16: qty 30, 30d supply, fill #3
  Filled 2022-10-17: qty 30, 30d supply, fill #4
  Filled 2022-11-26: qty 30, 30d supply, fill #5
  Filled 2023-01-06: qty 30, 30d supply, fill #6

## 2022-05-27 NOTE — Telephone Encounter (Signed)
Requested medication (s) are due for refill today:   Yes  Requested medication (s) are on the active medication list:   Yes  Future visit scheduled:   Yes in 3 mo.     Last ordered: 09/25/2021 #30, 6 refills  Returned because a lipid panel is due per protocol   Requested Prescriptions  Pending Prescriptions Disp Refills   pravastatin (PRAVACHOL) 40 MG tablet 30 tablet 6    Sig: Take 1 tablet (40 mg total) by mouth daily.     Cardiovascular:  Antilipid - Statins Failed - 05/26/2022  4:58 PM      Failed - Lipid Panel in normal range within the last 12 months    Cholesterol, Total  Date Value Ref Range Status  02/04/2021 137 100 - 199 mg/dL Final   LDL Chol Calc (NIH)  Date Value Ref Range Status  02/04/2021 80 0 - 99 mg/dL Final   HDL  Date Value Ref Range Status  02/04/2021 36 (L) >39 mg/dL Final   Triglycerides  Date Value Ref Range Status  02/04/2021 116 0 - 149 mg/dL Final         Passed - Patient is not pregnant      Passed - Valid encounter within last 12 months    Recent Outpatient Visits           1 month ago Type 2 diabetes mellitus with morbid obesity ([place])   [place] Karle Plumber B, MD   6 months ago Type 2 diabetes mellitus with morbid obesity [facility])   [place] Karle Plumber B, MD   10 months ago Type 2 diabetes mellitus with morbid obesity [facility])   [place] Karle Plumber B, MD   1 year ago Type 2 diabetes mellitus with morbid obesity [facility])   [place] Karle Plumber B, MD   1 year ago Type 2 diabetes mellitus with morbid obesity [facility])   [place], Dionne Bucy, [place]       Future Appointments             In 3 months Wynetta Emery, Dalbert Batman, MD [place]

## 2022-05-28 ENCOUNTER — Other Ambulatory Visit: Payer: Self-pay

## 2022-06-27 ENCOUNTER — Other Ambulatory Visit: Payer: Self-pay | Admitting: Internal Medicine

## 2022-06-27 ENCOUNTER — Other Ambulatory Visit: Payer: Self-pay

## 2022-06-27 DIAGNOSIS — I152 Hypertension secondary to endocrine disorders: Secondary | ICD-10-CM

## 2022-06-27 DIAGNOSIS — E1159 Type 2 diabetes mellitus with other circulatory complications: Secondary | ICD-10-CM

## 2022-06-27 MED ORDER — AMLODIPINE BESYLATE 10 MG PO TABS
10.0000 mg | ORAL_TABLET | Freq: Every day | ORAL | 0 refills | Status: DC
  Filled 2022-06-27: qty 90, 90d supply, fill #0

## 2022-06-27 NOTE — Telephone Encounter (Signed)
Requested Prescriptions  Pending Prescriptions Disp Refills   amLODipine (NORVASC) 10 MG tablet 90 tablet 0    Sig: TAKE 1 TABLET (10 MG TOTAL) BY MOUTH DAILY.     Cardiovascular: Calcium Channel Blockers 2 Passed - 06/27/2022  9:59 AM      Passed - Last BP in normal range    BP Readings from Last 1 Encounters:  05/22/22 135/83         Passed - Last Heart Rate in normal range    Pulse Readings from Last 1 Encounters:  05/22/22 72         Passed - Valid encounter within last 6 months    Recent Outpatient Visits           2 months ago Type 2 diabetes mellitus with morbid obesity ([place])   [place] Karle Plumber B, MD   7 months ago Type 2 diabetes mellitus with morbid obesity [facility])   [place] Karle Plumber B, MD   11 months ago Type 2 diabetes mellitus with morbid obesity [facility])   [place] Karle Plumber B, MD   1 year ago Type 2 diabetes mellitus with morbid obesity [facility])   [place] Karle Plumber B, MD   1 year ago Type 2 diabetes mellitus with morbid obesity [facility])   [place], Dionne Bucy, [place]       Future Appointments             In 2 months Wynetta Emery, Dalbert Batman, MD [place]

## 2022-08-15 ENCOUNTER — Other Ambulatory Visit: Payer: Self-pay

## 2022-08-15 ENCOUNTER — Other Ambulatory Visit (HOSPITAL_BASED_OUTPATIENT_CLINIC_OR_DEPARTMENT_OTHER): Payer: Self-pay

## 2022-08-15 ENCOUNTER — Other Ambulatory Visit: Payer: Self-pay | Admitting: Internal Medicine

## 2022-08-15 DIAGNOSIS — I152 Hypertension secondary to endocrine disorders: Secondary | ICD-10-CM

## 2022-08-15 DIAGNOSIS — I10 Essential (primary) hypertension: Secondary | ICD-10-CM

## 2022-08-15 MED ORDER — AMLODIPINE BESYLATE 10 MG PO TABS
10.0000 mg | ORAL_TABLET | Freq: Every day | ORAL | 1 refills | Status: DC
  Filled 2022-08-15 – 2022-09-17 (×2): qty 30, 30d supply, fill #0
  Filled 2022-10-17: qty 30, 30d supply, fill #1

## 2022-08-15 MED ORDER — LISINOPRIL 40 MG PO TABS
ORAL_TABLET | Freq: Every day | ORAL | 1 refills | Status: DC
  Filled 2022-08-15: qty 30, 30d supply, fill #0
  Filled 2022-09-16: qty 30, 30d supply, fill #1

## 2022-09-16 ENCOUNTER — Other Ambulatory Visit: Payer: Self-pay | Admitting: Internal Medicine

## 2022-09-16 ENCOUNTER — Other Ambulatory Visit: Payer: Self-pay

## 2022-09-16 DIAGNOSIS — E1159 Type 2 diabetes mellitus with other circulatory complications: Secondary | ICD-10-CM

## 2022-09-16 MED ORDER — CARVEDILOL 12.5 MG PO TABS
12.5000 mg | ORAL_TABLET | Freq: Two times a day (BID) | ORAL | 0 refills | Status: DC
  Filled 2022-09-16 – 2022-10-17 (×2): qty 60, 30d supply, fill #0

## 2022-09-17 ENCOUNTER — Other Ambulatory Visit: Payer: Self-pay

## 2022-09-19 LAB — HM DIABETES EYE EXAM

## 2022-09-23 ENCOUNTER — Ambulatory Visit: Payer: Medicaid Other | Admitting: Internal Medicine

## 2022-09-30 ENCOUNTER — Ambulatory Visit: Payer: Self-pay | Admitting: Internal Medicine

## 2022-10-17 ENCOUNTER — Other Ambulatory Visit: Payer: Self-pay

## 2022-10-17 ENCOUNTER — Other Ambulatory Visit: Payer: Self-pay | Admitting: Internal Medicine

## 2022-10-17 DIAGNOSIS — I10 Essential (primary) hypertension: Secondary | ICD-10-CM

## 2022-10-17 MED ORDER — LISINOPRIL 40 MG PO TABS
40.0000 mg | ORAL_TABLET | Freq: Every day | ORAL | 0 refills | Status: AC
Start: 2022-10-17 — End: ?
  Filled 2022-10-17 – 2022-10-27 (×2): qty 90, 90d supply, fill #0

## 2022-10-17 NOTE — Telephone Encounter (Signed)
Requested medication (s) are due for refill today- yes  Requested medication (s) are on the active medication list -yes  Future visit scheduled -yes  Last refill: 08/15/22 #30 1RF  Notes to clinic: Last RF has notes- next appointment scheduled 12/19/22- sent for review of RF request  Requested Prescriptions  Pending Prescriptions Disp Refills   lisinopril (ZESTRIL) 40 MG tablet 30 tablet 1    Sig: TAKE 1 TABLET (40 MG TOTAL) BY MOUTH DAILY.     Cardiovascular:  ACE Inhibitors Failed - 10/17/2022 12:32 PM      Failed - Valid encounter within last 6 months    Recent Outpatient Visits           6 months ago Type 2 diabetes mellitus with morbid obesity [facility])   [facility] Jonah Blue B, MD   10 months ago Type 2 diabetes mellitus with morbid obesity [facility])   [facility] Jonah Blue B, MD   1 year ago Type 2 diabetes mellitus with morbid obesity [facility])   [place] [facility] Jonah Blue B, MD   1 year ago Type 2 diabetes mellitus with morbid obesity [facility])   [facility] Jon[facility] Blue B, MD   2 years ago Type 2 diabetes mellitus with morbid obesity [facility])   [place] [facility] [place], [place], [place]       Future Appointments             In 2 months Marcine Matar, MD [facility]            Passed - Cr in normal range and within 180 days    Creatinine, Ser  Date Value Ref Range Status  05/22/2022 0.80 0.61 - 1.24 mg/dL Final   Creatinine, POC  Date Value Ref Range Status  10/31/2016 100 mg/dL Final         Passed - K in normal range and within 180 days    Potassium  Date Value Ref Range Status  05/22/2022 3.6 3.5 - 5.1 mmol/L Final         Passed - Patient is not pregnant      Passed - Last BP in normal range    BP Readings from Last 1 Encounters:   05/22/22 135/83            Requested Prescriptions  Pending Prescriptions Disp Refills   lisinopril (ZESTRIL) 40 MG tablet 30 tablet 1    Sig: TAKE 1 TABLET (40 MG TOTAL) BY MOUTH DAILY.     Cardiovascular:  ACE Inhibitors Failed - 10/17/2022 12:32 PM      Failed - Valid encounter within last 6 months    Recent Outpatient Visits           6 months ago Type 2 diabetes mellitus with morbid obesity [facility])   [place] [facility] Jonah Blue B, MD   10 months ago Type 2 diabetes mellitus with morbid obesity [facility])   [place] [facility] Jonah Blue B, MD   1 year ago Type 2 diabetes mellitus with morbid obesity [facility])   [place] [facility] Jonah Blue B, MD   1 year ago Type 2 diabetes mellitus with morbid obesity [facility])   [place] Marcine Matar, MD  2 years ago Type 2 diabetes mellitus with morbid obesity [facility])   [facility] [place], [place], [place]       Future Appointments             In 2 months Marcine Matar, MD [facility]            Passed - Cr in normal range and within 180 days    Creatinine, Ser  Date Value Ref Range Status  05/22/2022 0.80 0.61 - 1.24 mg/dL Final   Creatinine, POC  Date Value Ref Range Status  10/31/2016 100 mg/dL Final         Passed - K in normal range and within 180 days    Potassium  Date Value Ref Range Status  05/22/2022 3.6 3.5 - 5.1 mmol/L Final         Passed - Patient is not pregnant      Passed - Last BP in normal range    BP Readings from Last 1 Encounters:  05/22/22 135/83

## 2022-10-20 ENCOUNTER — Other Ambulatory Visit: Payer: Self-pay

## 2022-10-23 ENCOUNTER — Other Ambulatory Visit: Payer: Self-pay

## 2022-10-27 ENCOUNTER — Other Ambulatory Visit: Payer: Self-pay

## 2022-11-26 ENCOUNTER — Other Ambulatory Visit: Payer: Self-pay

## 2022-11-26 ENCOUNTER — Other Ambulatory Visit: Payer: Self-pay | Admitting: Internal Medicine

## 2022-11-26 DIAGNOSIS — I152 Hypertension secondary to endocrine disorders: Secondary | ICD-10-CM

## 2022-11-26 MED ORDER — CARVEDILOL 12.5 MG PO TABS
12.5000 mg | ORAL_TABLET | Freq: Two times a day (BID) | ORAL | 0 refills | Status: DC
Start: 2022-11-26 — End: 2022-12-19
  Filled 2022-11-26: qty 60, 30d supply, fill #0

## 2022-11-26 MED ORDER — AMLODIPINE BESYLATE 10 MG PO TABS
10.0000 mg | ORAL_TABLET | Freq: Every day | ORAL | 0 refills | Status: AC
Start: 2022-11-26 — End: ?
  Filled 2022-11-26: qty 30, 30d supply, fill #0

## 2022-11-28 ENCOUNTER — Other Ambulatory Visit: Payer: Self-pay

## 2022-12-19 ENCOUNTER — Ambulatory Visit: Payer: Medicaid Other | Admitting: Internal Medicine

## 2022-12-19 ENCOUNTER — Encounter: Payer: Self-pay | Admitting: Internal Medicine

## 2022-12-19 ENCOUNTER — Other Ambulatory Visit: Payer: Self-pay

## 2022-12-19 DIAGNOSIS — E785 Hyperlipidemia, unspecified: Secondary | ICD-10-CM

## 2022-12-19 DIAGNOSIS — Z125 Encounter for screening for malignant neoplasm of prostate: Secondary | ICD-10-CM

## 2022-12-19 DIAGNOSIS — R109 Unspecified abdominal pain: Secondary | ICD-10-CM

## 2022-12-19 DIAGNOSIS — I152 Hypertension secondary to endocrine disorders: Secondary | ICD-10-CM

## 2022-12-19 DIAGNOSIS — E1159 Type 2 diabetes mellitus with other circulatory complications: Secondary | ICD-10-CM

## 2022-12-19 DIAGNOSIS — Z7984 Long term (current) use of oral hypoglycemic drugs: Secondary | ICD-10-CM | POA: Diagnosis not present

## 2022-12-19 DIAGNOSIS — R10A0 Flank pain, unspecified side: Secondary | ICD-10-CM

## 2022-12-19 DIAGNOSIS — E1169 Type 2 diabetes mellitus with other specified complication: Secondary | ICD-10-CM | POA: Diagnosis not present

## 2022-12-19 LAB — POCT GLYCOSYLATED HEMOGLOBIN (HGB A1C): HbA1c, POC (controlled diabetic range): 6.7 % (ref 0.0–7.0)

## 2022-12-19 LAB — GLUCOSE, POCT (MANUAL RESULT ENTRY): POC Glucose: 113 mg/dl — AB (ref 70–99)

## 2022-12-19 MED ORDER — HYDROCHLOROTHIAZIDE 25 MG PO TABS
25.0000 mg | ORAL_TABLET | Freq: Every day | ORAL | 1 refills | Status: DC
  Filled 2022-12-19: qty 90, 90d supply, fill #0
  Filled 2023-04-22: qty 90, 90d supply, fill #1

## 2022-12-19 MED ORDER — CARVEDILOL 12.5 MG PO TABS
12.5000 mg | ORAL_TABLET | Freq: Two times a day (BID) | ORAL | 1 refills | Status: DC
Start: 2022-12-19 — End: 2023-08-14
  Filled 2022-12-19: qty 180, 90d supply, fill #0
  Filled 2023-04-22: qty 180, 90d supply, fill #1

## 2022-12-19 NOTE — Progress Notes (Signed)
Patient ID: Scott Mcmillan, male    DOB: 04/20/1961  MRN: [id number]  CC: Diabetes and Medication Refill   Subjective: Scott Mcmillan is a 62 y.o. male who presents for chronic ds management His concerns today include:  Patient with history of HTN, HL, morbid obesity, DM.   DM: Results for orders placed or performed in visit on 12/19/22  POCT glucose (manual entry)  Result Value Ref Range   POC Glucose 113 (A) 70 - 99 mg/dl  HgB W0J  Result Value Ref Range   Hemoglobin A1C     HbA1c POC (<> result, manual entry)     HbA1c, POC (prediabetic range)     HbA1c, POC (controlled diabetic range) 6.7 0.0 - 7.0 %  Compliant with Metformin 500 mg BID. Has soft stools once a wk. Use to have diarrhea daily when he was on regular Metformin.  Non on Metformin XR.  Reports he has been cheating with eating sweet snacks over past few wks.  Does not feel seeing nutritionist will help. Tries to walk around his neighborhood.  Knees get inflam occasionally but does not last long.  Saw Dr. Nedra Hai for eye exam in May. He plans to bring form  HTN: reports compliance with Lisinopril 40 mg daily, Coreg 12.5 mg BID and Norvasc 10 mg daily. Checks BP daily.  Range 120/80 No CP/SOB/LE edema  HL:  taking and tolerating Pravachol.  Last LDL 80   Kidney/flank pain intermittently for past few mths. Had kidney stones in past, pass 2 Pain occurs about once a wk on RT flank pain ranked 1/10.  Occurs more on LT side.  Can last quick seconds to hrs.  No gross hematuria, no dysuria  Patient Active Problem List   Diagnosis Date Noted   Hyperlipidemia associated with type 2 diabetes mellitus (HCC) 11/08/2019   Influenza vaccine needed 01/13/2019   Non-recurrent bilateral inguinal hernia without obstruction or gangrene 04/23/2018   Hemorrhoids 01/22/2018   Actinic keratosis due to exposure to sunlight 07/21/2017   Lower extremity edema 07/21/2017   Class 3 severe obesity due to excess calories without serious  comorbidity with body mass index (BMI) of 50.0 to 59.9 in adult [facility]) 07/21/2017   Hyperlipidemia with target low density lipoprotein (LDL) cholesterol less than 100 mg/dL 81/19/1478   Hypertension 10/31/2016     Current Outpatient Medications on File Prior to Visit  Medication Sig Dispense Refill   amLODipine (NORVASC) 10 MG tablet Take 1 tablet (10 mg total) by mouth daily. 30 tablet 0   aspirin EC 81 MG tablet Take 1 tablet (81 mg total) by mouth daily. 90 tablet 3   Blood Glucose Monitoring Suppl (TRUE METRIX METER) w/Device KIT Use as directed 1 kit 0   Docusate Sodium (STOOL SOFTENER LAXATIVE PO) Take 100 mg by mouth. Takes 2 to 4 capsules daily as needed.     glucose blood (TRUE METRIX BLOOD GLUCOSE TEST) test strip Use as instructed 100 each 3   lisinopril (ZESTRIL) 40 MG tablet Take 1 tablet (40 mg total) by mouth daily. 90 tablet 0   metFORMIN (GLUCOPHAGE-XR) 500 MG 24 hr tablet Take 1 tablet (500 mg total) by mouth 2 (two) times daily. 60 tablet 6   oxyCODONE (OXY IR/ROXICODONE) 5 MG immediate release tablet Take 1-2 tablets (5-10 mg total) by mouth every 6 (six) hours as needed for severe pain. 30 tablet 0   pravastatin (PRAVACHOL) 40 MG tablet Take 1 tablet (40 mg total) by mouth daily. 30  tablet 6   TRUEplus Lancets 28G MISC USE AS DIRECTED 100 each 3   No current facility-administered medications on file prior to visit.    No Known Allergies  Social History   Socioeconomic History   Marital status: Single    Spouse name: Not on file   Number of children: Not on file   Years of education: Not on file   Highest education level: Not on file  Occupational History   Not on file  Tobacco Use   Smoking status: Never   Smokeless tobacco: Never  Vaping Use   Vaping status: Never Used  Substance and Sexual Activity   Alcohol use: No   Drug use: No   Sexual activity: Not on file  Other Topics Concern   Not on file  Social History Narrative   Not on file   Social  Determinants of Health   Financial Resource Strain: Not on file  Food Insecurity: Not on file  Transportation Needs: Not on file  Physical Activity: Not on file  Stress: Not on file  Social Connections: Not on file  Intimate Partner Violence: Not on file    Family History  Problem Relation Age of Onset   Heart disease Mother    Lung cancer Father    Colon cancer Neg Hx    Esophageal cancer Neg Hx    Liver cancer Neg Hx    Pancreatic cancer Neg Hx    Rectal cancer Neg Hx    Stomach cancer Neg Hx     Past Surgical History:  Procedure Laterality Date   COLONOSCOPY     ~5 yrs ago   FISTULOTOMY N/A 12/12/2021   Procedure: SETON PLACEMENT;  Surgeon: Romie Levee, MD;  [facility];  Service: General;  Laterality: N/A;   LIGATION OF INTERNAL FISTULA TRACT N/A 05/22/2022   Procedure: LIGATION OF INTERNAL FISTULA TRACT;  Surgeon: Romie Levee, MD;  Location: [facility];  Service: General;  Laterality: N/A;   NO PAST SURGERIES      ROS: Review of Systems Negative except as stated above  PHYSICAL EXAM: BP 110/75 (BP Location: Right Arm, Patient Position: Sitting, Cuff Size: Large)   Pulse 81   Wt 289 lb (131.1 kg)   SpO2 95%   BMI 45.26 kg/m   Wt Readings from Last 3 Encounters:  12/19/22 289 lb (131.1 kg)  05/22/22 299 lb 9.6 oz (135.9 kg)  03/31/22 291 lb (132 kg)    Physical Exam   General appearance - alert, well appearing, morbidly obese elderly Caucasian male and in no distress Mental status - normal mood, behavior, speech, dress, motor activity, and thought processes Neck - supple, no significant adenopathy Chest - clear to auscultation, no wheezes, rales or rhonchi, symmetric air entry Heart - normal rate, regular rhythm, normal S1, S2, no murmurs, rubs, clicks or gallops Musculoskeletal - no joint tenderness, deformity or swelling Extremities - peripheral pulses normal, no pedal edema, no clubbing or cyanosis      Latest Ref Rng & Units 05/22/2022    6:09 AM 12/12/2021   10:33 AM 02/04/2021    2:59 PM  CMP  Glucose 70 - 99 mg/dL 098  119  147   BUN 8 - 23 mg/dL 12  9  21    Creatinine 0.61 - 1.24 mg/dL 8.29  5.62  1.30   Sodium 135 - 145 mmol/L 138  139  140   Potassium 3.5 - 5.1 mmol/L 3.6  3.3  4.1   Chloride 98 - 111 mmol/L 98  97  94   CO2 20 - 29 mmol/L   26   Calcium 8.6 - 10.2 mg/dL   9.6   Total Protein 6.0 - 8.5 g/dL   7.6   Total Bilirubin 0.0 - 1.2 mg/dL   0.7   Alkaline Phos 44 - 121 IU/L   43   AST 0 - 40 IU/L   22   ALT 0 - 44 IU/L   26    Lipid Panel     Component Value Date/Time   CHOL 137 02/04/2021 1459   TRIG 116 02/04/2021 1459   HDL 36 (L) 02/04/2021 1459   CHOLHDL 3.8 02/04/2021 1459   LDLCALC 80 02/04/2021 1459    CBC    Component Value Date/Time   WBC 11.1 (H) 02/04/2021 1459   WBC 12.1 (H) 09/23/2016 0953   RBC 5.02 02/04/2021 1459   RBC 4.66 09/23/2016 0953   HGB 15.3 05/22/2022 0609   HGB 15.3 02/04/2021 1459   HCT 45.0 05/22/2022 0609   HCT 45.6 02/04/2021 1459   PLT 212 02/04/2021 1459   MCV 91 02/04/2021 1459   MCH 30.5 02/04/2021 1459   MCH 29.2 09/23/2016 0953   MCHC 33.6 02/04/2021 1459   MCHC 32.1 09/23/2016 0953   RDW 12.6 02/04/2021 1459    ASSESSMENT AND PLAN: 1. Type 2 diabetes mellitus with morbid obesity (HCC) At goal.  He feels comfortable continuing the extended release metformin at this time.  Encourage healthy eating habits and choosing healthier snacks.  Encouraged him to try to move as much as he can. - CBC - Comprehensive metabolic panel  2. Hyperlipidemia associated with type 2 diabetes mellitus (HCC) Continue Pravachol. - POCT glucose (manual entry) - HgB A1c  3. Hypertension associated with diabetes (HCC) At goal.  Continue Norvasc 10 mg daily, carvedilol 12.5 mg twice a day and HCTZ 25 mg daily. - Lipid panel - carvedilol (COREG) 12.5 MG tablet; Take 1 tablet (12.5 mg total) by mouth 2 (two) times daily with a meal.   Dispense: 180 tablet; Refill: 1 - hydrochlorothiazide (HYDRODIURIL) 25 MG tablet; Take 1 tablet (25 mg total) by mouth daily.  Dispense: 90 tablet; Refill: 1  4. Flank pain We will begin by checking a urinalysis to see if there is any hematuria.  May consider a plain KUB x-ray as well. - Urinalysis, Routine w reflex microscopic  5. Prostate cancer screening Patient agreeable to screening. - PSA    Patient was given the opportunity to ask questions.  Patient verbalized understanding of the plan and was able to repeat key elements of the plan.   This documentation was completed using Paediatric nurse.  Any transcriptional errors are unintentional.  Orders Placed This Encounter  Procedures   CBC   Comprehensive metabolic panel   PSA   Lipid panel   Urinalysis, Routine w reflex microscopic   POCT glucose (manual entry)   HgB A1c     Requested Prescriptions   Signed Prescriptions Disp Refills   carvedilol (COREG) 12.5 MG tablet 180 tablet 1    Sig: Take 1 tablet (12.5 mg total) by mouth 2 (two) times daily with a meal.   hydrochlorothiazide (HYDRODIURIL) 25 MG tablet 90 tablet 1    Sig: Take 1 tablet (25 mg total) by mouth daily.    Return in about 4 months (around 04/20/2023).  Jonah Blue, MD, FACP

## 2022-12-19 NOTE — Patient Instructions (Signed)

## 2022-12-20 ENCOUNTER — Other Ambulatory Visit: Payer: Self-pay | Admitting: Internal Medicine

## 2022-12-20 DIAGNOSIS — R972 Elevated prostate specific antigen [PSA]: Secondary | ICD-10-CM

## 2022-12-20 LAB — URINALYSIS, ROUTINE W REFLEX MICROSCOPIC
Bilirubin, UA: NEGATIVE
Glucose, UA: NEGATIVE
Leukocytes,UA: NEGATIVE
Nitrite, UA: NEGATIVE
RBC, UA: NEGATIVE
Specific Gravity, UA: 1.025 (ref 1.005–1.030)
Urobilinogen, Ur: 1 mg/dL (ref 0.2–1.0)
pH, UA: 5.5 (ref 5.0–7.5)

## 2022-12-20 LAB — LIPID PANEL
Chol/HDL Ratio: 3.9 ratio (ref 0.0–5.0)
Cholesterol, Total: 145 mg/dL (ref 100–199)
HDL: 37 mg/dL — ABNORMAL LOW (ref >39)
LDL Chol Calc (NIH): 83 mg/dL (ref 0–99)
Triglycerides: 144 mg/dL (ref 0–149)
VLDL Cholesterol Cal: 25 mg/dL (ref 5–40)

## 2022-12-20 LAB — CBC
Hematocrit: 46.1 % (ref 37.5–51.0)
Hemoglobin: 15.5 g/dL (ref 13.0–17.7)
MCH: 30.4 pg (ref 26.6–33.0)
MCHC: 33.6 g/dL (ref 31.5–35.7)
MCV: 90 fL (ref 79–97)
Platelets: 226 10*3/uL (ref 150–450)
RBC: 5.1 x10E6/uL (ref 4.14–5.80)
RDW: 12.8 % (ref 11.6–15.4)
WBC: 10.3 10*3/uL (ref 3.4–10.8)

## 2022-12-20 LAB — COMPREHENSIVE METABOLIC PANEL
ALT: 25 IU/L (ref 0–44)
AST: 27 IU/L (ref 0–40)
Albumin: 4.4 g/dL (ref 3.9–4.9)
Alkaline Phosphatase: 47 IU/L (ref 44–121)
BUN/Creatinine Ratio: 16 (ref 10–24)
BUN: 17 mg/dL (ref 8–27)
Bilirubin Total: 0.5 mg/dL (ref 0.0–1.2)
CO2: 25 mmol/L (ref 20–29)
Calcium: 10.4 mg/dL — ABNORMAL HIGH (ref 8.6–10.2)
Chloride: 100 mmol/L (ref 96–106)
Creatinine, Ser: 1.07 mg/dL (ref 0.76–1.27)
Globulin, Total: 3.1 g/dL (ref 1.5–4.5)
Glucose: 118 mg/dL — ABNORMAL HIGH (ref 70–99)
Potassium: 3.9 mmol/L (ref 3.5–5.2)
Sodium: 142 mmol/L (ref 134–144)
Total Protein: 7.5 g/dL (ref 6.0–8.5)
eGFR: 77 mL/min/{1.73_m2} (ref >59)

## 2022-12-20 LAB — PSA: Prostate Specific Ag, Serum: 5.7 ng/mL — ABNORMAL HIGH (ref 0.0–4.0)

## 2022-12-20 LAB — MICROSCOPIC EXAMINATION
Bacteria, UA: NONE SEEN
Casts: NONE SEEN /LPF

## 2023-01-06 ENCOUNTER — Other Ambulatory Visit: Payer: Self-pay

## 2023-01-15 ENCOUNTER — Other Ambulatory Visit: Payer: Self-pay

## 2023-01-16 ENCOUNTER — Other Ambulatory Visit: Payer: Self-pay

## 2023-02-04 ENCOUNTER — Other Ambulatory Visit: Payer: Self-pay

## 2023-02-04 ENCOUNTER — Other Ambulatory Visit: Payer: Self-pay | Admitting: Internal Medicine

## 2023-02-04 DIAGNOSIS — I152 Hypertension secondary to endocrine disorders: Secondary | ICD-10-CM

## 2023-02-04 DIAGNOSIS — I10 Essential (primary) hypertension: Secondary | ICD-10-CM

## 2023-02-04 DIAGNOSIS — E785 Hyperlipidemia, unspecified: Secondary | ICD-10-CM

## 2023-02-05 ENCOUNTER — Other Ambulatory Visit: Payer: Self-pay

## 2023-02-05 MED ORDER — LISINOPRIL 40 MG PO TABS
40.0000 mg | ORAL_TABLET | Freq: Every day | ORAL | 1 refills | Status: DC
Start: 2023-02-05 — End: 2023-06-25
  Filled 2023-02-05: qty 90, 90d supply, fill #0
  Filled 2023-05-22: qty 90, 90d supply, fill #1

## 2023-02-05 MED ORDER — METFORMIN HCL ER 500 MG PO TB24
500.0000 mg | ORAL_TABLET | Freq: Two times a day (BID) | ORAL | 1 refills | Status: DC
  Filled 2023-02-05 – 2023-02-25 (×3): qty 180, 90d supply, fill #0

## 2023-02-05 MED ORDER — AMLODIPINE BESYLATE 10 MG PO TABS
10.0000 mg | ORAL_TABLET | Freq: Every day | ORAL | 1 refills | Status: DC
Start: 2023-02-05 — End: 2023-06-25
  Filled 2023-02-05: qty 90, 90d supply, fill #0
  Filled 2023-05-22: qty 90, 90d supply, fill #1

## 2023-02-05 MED ORDER — PRAVASTATIN SODIUM 40 MG PO TABS
40.0000 mg | ORAL_TABLET | Freq: Every day | ORAL | 1 refills | Status: DC
Start: 2023-02-05 — End: 2023-08-14
  Filled 2023-02-05: qty 90, 90d supply, fill #0
  Filled 2023-05-22: qty 90, 90d supply, fill #1

## 2023-02-05 NOTE — Telephone Encounter (Signed)
Requested Prescriptions  Pending Prescriptions Disp Refills   metFORMIN (GLUCOPHAGE-XR) 500 MG 24 hr tablet 180 tablet 1    Sig: Take 1 tablet (500 mg total) by mouth 2 (two) times daily.     Endocrinology:  Diabetes - Biguanides Failed - 02/04/2023  4:43 PM      Failed - B12 Level in normal range and within 720 days    No results found for: "VITAMINB12"       Failed - CBC within normal limits and completed in the last 12 months    WBC  Date Value Ref Range Status  12/19/2022 10.3 3.4 - 10.8 x10E3/uL Final  09/23/2016 12.1 (H) 4.0 - 10.5 K/uL Final   RBC  Date Value Ref Range Status  12/19/2022 5.10 4.14 - 5.80 x10E6/uL Final  09/23/2016 4.66 4.22 - 5.81 MIL/uL Final   Hemoglobin  Date Value Ref Range Status  12/19/2022 15.5 13.0 - 17.7 g/dL Final   Hematocrit  Date Value Ref Range Status  12/19/2022 46.1 37.5 - 51.0 % Final   MCHC  Date Value Ref Range Status  12/19/2022 33.6 31.5 - 35.7 g/dL Final  36/64/4034 74.2 30.0 - 36.0 g/dL Final   [facility]  Date Value Ref Range Status  12/19/2022 30.4 26.6 - 33.0 pg Final  09/23/2016 29.2 26.0 - 34.0 pg Final   MCV  Date Value Ref Range Status  12/19/2022 90 79 - 97 fL Final   No results found for: "PLTCOUNTKUC", "LABPLAT", "POCPLA" RDW  Date Value Ref Range Status  12/19/2022 12.8 11.6 - 15.4 % Final         Passed - Cr in normal range and within 360 days    Creatinine, Ser  Date Value Ref Range Status  12/19/2022 1.07 0.76 - 1.27 mg/dL Final   Creatinine, POC  Date Value Ref Range Status  10/31/2016 100 mg/dL Final         Passed - HBA1C is between 0 and 7.9 and within 180 days    HbA1c, POC (prediabetic range)  Date Value Ref Range Status  01/13/2019 6.4 5.7 - 6.4 % Final   HbA1c, POC (controlled diabetic range)  Date Value Ref Range Status  12/19/2022 6.7 0.0 - 7.0 % Final         Passed - eGFR in normal range and within 360 days    GFR calc Af Amer  Date Value Ref Range Status  11/11/2019 96 >59  mL/min/1.73 Final    Comment:    **[facility] currently reports eGFR in compliance with the current**   recommendations of the [employer]. [facility] will   update reporting as new guidelines are published from the NKF-ASN   Task force.    GFR calc non Af Amer  Date Value Ref Range Status  11/11/2019 83 >59 mL/min/1.73 Final   eGFR  Date Value Ref Range Status  12/19/2022 77 >59 mL/min/1.73 Final         Passed - Valid encounter within last 6 months    Recent Outpatient Visits           1 month ago Type 2 diabetes mellitus with morbid obesity [facility])   [place] [facility] Jonah Blue B, MD   10 months ago Type 2 diabetes mellitus with morbid obesity [facility])    [place] [facility] Jonah Blue B, MD   1 year ago Type 2 diabetes mellitus with morbid obesity [facility])   [place] [facility] Jonah Blue B, MD   1 year ago Type 2 diabetes mellitus with morbid obesity [facility])   [place] [facility] Jonah Blue B, MD   2 years ago Type 2 diabetes mellitus with morbid obesity [facility])   [place] [facility] Marcine Matar, MD       Future Appointments             In 2 months Marcine Matar, MD [place] [facility]             pravastatin (PRAVACHOL) 40 MG tablet 90 tablet 1    Sig: Take 1 tablet (40 mg total) by mouth daily.     Cardiovascular:  Antilipid - Statins Failed - 02/04/2023  4:43 PM      Failed - Lipid Panel in normal range within the last 12 months    Cholesterol, Total  Date Value Ref Range Status  12/19/2022 145 100 - 199 mg/dL Final   LDL Chol Calc (NIH)  Date Value Ref Range Status  12/19/2022 83 0 - 99 mg/dL Final   HDL  Date Value Ref Range Status  12/19/2022 37 (L) >39 mg/dL Final   Triglycerides  Date Value Ref Range Status  12/19/2022 144 0 - 149 mg/dL Final          Passed - Patient is not pregnant      Passed - Valid encounter within last 12 months    Recent Outpatient Visits           1 month ago Type 2 diabetes mellitus with morbid obesity (HCC)   [place] [facility] Jonah Blue B, MD   10 months ago Type 2 diabetes mellitus with morbid obesity [facility])   [place] [facility] Jonah Blue B, MD   1 year ago Type 2 diabetes mellitus with morbid obesity [facility])   [place] [facility] Jonah Blue B, MD   1 year ago Type 2 diabetes mellitus with morbid obesity [facility])   [place] [facility] & [facility] Jonah Blue B, MD   2 years ago Type 2 diabetes mellitus with morbid obesity [facility])   [place] [facility] Marcine Matar, MD       Future Appointments             In 2 months Marcine Matar, MD [place] [facility]             lisinopril (ZESTRIL) 40 MG tablet 90 tablet 1    Sig: Take 1 tablet (40 mg total) by mouth daily.     Cardiovascular:  ACE Inhibitors Passed - 02/04/2023  4:43 PM      Passed - Cr in normal range and within 180 days    Creatinine, Ser  Date Value Ref Range Status  12/19/2022 1.07 0.76 - 1.27 mg/dL Final   Creatinine, POC  Date Value Ref Range Status  10/31/2016 100 mg/dL Final         Passed - K in normal range and within 180 days    Potassium  Date Value Ref Range Status  12/19/2022 3.9 3.5 - 5.2 mmol/L Final         Passed - Patient is not pregnant      Passed - Last BP in normal range    BP Readings from Last  1 Encounters:  12/19/22 110/75         Passed - Valid encounter within last 6 months    Recent Outpatient Visits           1 month ago Type 2 diabetes mellitus with morbid obesity (HCC)   [place] [facility] Jonah Blue B, MD   10 months ago Type 2 diabetes mellitus with morbid obesity  [facility])   [place] [facility] & [facility] Jonah Blue B, MD   1 year ago Type 2 diabetes mellitus with morbid obesity [facility])   [place] [facility] Jonah Blue B, MD   1 year ago Type 2 diabetes mellitus with morbid obesity [facility])   [place] [facility] Jonah Blue B, MD   2 years ago Type 2 diabetes mellitus with morbid obesity [facility])   [place] [facility] & [facility] Marcine Matar, MD       Future Appointments             In 2 months Marcine Matar, MD [place] [facility]             amLODipine (NORVASC) 10 MG tablet 90 tablet 1    Sig: Take 1 tablet (10 mg total) by mouth daily.     Cardiovascular: Calcium Channel Blockers 2 Passed - 02/04/2023  4:43 PM      Passed - Last BP in normal range    BP Readings from Last 1 Encounters:  12/19/22 110/75         Passed - Last Heart Rate in normal range    Pulse Readings from Last 1 Encounters:  12/19/22 81         Passed - Valid encounter within last 6 months    Recent Outpatient Visits           1 month ago Type 2 diabetes mellitus with morbid obesity (HCC)   [place] [facility] Jonah Blue B, MD   10 months ago Type 2 diabetes mellitus with morbid obesity [facility])   [place] [facility] Jonah Blue B, MD   1 year ago Type 2 diabetes mellitus with morbid obesity [facility])   [place] [facility] Jonah Blue B, MD   1 year ago Type 2 diabetes mellitus with morbid obesity [facility])   [place] [facility] Jonah Blue B, MD   2 years ago Type 2 diabetes mellitus with morbid obesity [facility])   [place] [facility] Marcine Matar, MD       Future Appointments             In 2 months Laural Benes, Binnie Rail, MD [facility]

## 2023-02-06 ENCOUNTER — Other Ambulatory Visit: Payer: Self-pay

## 2023-02-09 DIAGNOSIS — R351 Nocturia: Secondary | ICD-10-CM | POA: Diagnosis not present

## 2023-02-09 DIAGNOSIS — R972 Elevated prostate specific antigen [PSA]: Secondary | ICD-10-CM | POA: Diagnosis not present

## 2023-02-13 ENCOUNTER — Other Ambulatory Visit: Payer: Self-pay

## 2023-02-24 ENCOUNTER — Other Ambulatory Visit: Payer: Self-pay

## 2023-02-25 ENCOUNTER — Other Ambulatory Visit: Payer: Self-pay

## 2023-04-01 ENCOUNTER — Other Ambulatory Visit: Payer: Self-pay

## 2023-04-20 ENCOUNTER — Other Ambulatory Visit: Payer: Self-pay

## 2023-04-20 ENCOUNTER — Ambulatory Visit: Payer: Medicaid Other | Attending: Internal Medicine | Admitting: Internal Medicine

## 2023-04-20 ENCOUNTER — Encounter: Payer: Self-pay | Admitting: Internal Medicine

## 2023-04-20 DIAGNOSIS — Z7984 Long term (current) use of oral hypoglycemic drugs: Secondary | ICD-10-CM

## 2023-04-20 DIAGNOSIS — K4020 Bilateral inguinal hernia, without obstruction or gangrene, not specified as recurrent: Secondary | ICD-10-CM | POA: Diagnosis not present

## 2023-04-20 DIAGNOSIS — E1159 Type 2 diabetes mellitus with other circulatory complications: Secondary | ICD-10-CM | POA: Diagnosis not present

## 2023-04-20 DIAGNOSIS — E785 Hyperlipidemia, unspecified: Secondary | ICD-10-CM

## 2023-04-20 DIAGNOSIS — I152 Hypertension secondary to endocrine disorders: Secondary | ICD-10-CM

## 2023-04-20 DIAGNOSIS — E1169 Type 2 diabetes mellitus with other specified complication: Secondary | ICD-10-CM

## 2023-04-20 DIAGNOSIS — Z23 Encounter for immunization: Secondary | ICD-10-CM

## 2023-04-20 DIAGNOSIS — Z7985 Long-term (current) use of injectable non-insulin antidiabetic drugs: Secondary | ICD-10-CM

## 2023-04-20 DIAGNOSIS — E66813 Obesity, class 3: Secondary | ICD-10-CM | POA: Diagnosis not present

## 2023-04-20 LAB — POCT GLYCOSYLATED HEMOGLOBIN (HGB A1C): HbA1c, POC (controlled diabetic range): 8.3 % — AB (ref 0.0–7.0)

## 2023-04-20 LAB — GLUCOSE, POCT (MANUAL RESULT ENTRY): POC Glucose: 154 mg/dL — AB (ref 70–99)

## 2023-04-20 MED ORDER — TRUE METRIX BLOOD GLUCOSE TEST VI STRP
ORAL_STRIP | 3 refills | Status: DC
  Filled 2023-04-20: qty 100, 25d supply, fill #0

## 2023-04-20 MED ORDER — TRUEPLUS LANCETS 28G MISC
3 refills | Status: DC
  Filled 2023-04-20: qty 100, fill #0

## 2023-04-20 MED ORDER — TRULICITY 0.75 MG/0.5ML ~~LOC~~ SOAJ
0.7500 mg | SUBCUTANEOUS | 2 refills | Status: DC
  Filled 2023-04-20: qty 2, 28d supply, fill #0
  Filled 2023-05-22: qty 2, 28d supply, fill #1

## 2023-04-20 NOTE — Progress Notes (Signed)
Patient ID: Scott Mcmillan, male    DOB: 01/31/1961  MRN: [id number]  CC: Diabetes (DM f/u./Concern about Metformin causing loose stools Ottis Stain on R & L side of groin x4 mo /)   Subjective: Scott Mcmillan is a 62 y.o. male who presents for chronic ds management. His concerns today include:  Patient with history of HTN, HL, morbid obesity, DM.   Discussed the use of AI scribe software for clinical note transcription with the patient, who gave verbal consent to proceed.  History of Present Illness   The patient, with a history of diabetes, hypertension, and hyperlipidemia, presents for follow-up.   DM/Obesity: Results for orders placed or performed in visit on 04/20/23  POCT glucose (manual entry)   Collection Time: 04/20/23  4:31 PM  Result Value Ref Range   POC Glucose 154 (A) 70 - 99 mg/dl  POCT glycosylated hemoglobin (Hb A1C)   Collection Time: 04/20/23  4:39 PM  Result Value Ref Range   Hemoglobin A1C     HbA1c POC (<> result, manual entry)     HbA1c, POC (prediabetic range)     HbA1c, POC (controlled diabetic range) 8.3 (A) 0.0 - 7.0 %  A1C increased from 6.7 4 mths ago On Metformin XR 500 mg BID He reports loose stools and thin, messy bowel movements, which he attributes to metformin 500mg  twice daily. He has been adherent to the medication regimen but has noticed an increase in these gastrointestinal symptoms over the past six months. He expresses a desire to try a different medication for diabetes management.  He acknowledges a recent weight gain of 20 lbs since last visit  and increased food intake, particularly during the holiday season.   Patient had CT scan of the abdomen and pelvis 06/2017 that showed incidental findings of bilateral inguinal hernias that contain fat.  He thinks these are beginning to manifest themselves as he has had intermittent pain in both groin areas for the last 4 months.  He has experienced intermittent pain in these areas, particularly when  moving in certain ways or lifting heavy objects.  He helps to take care of his mother and has to do some lifting of her.  He has not noticed any bulging or mass in the inguinal areas.  he has been managing these symptoms with over-the-counter pain relievers.  HTN: The patient's blood pressure has been well-controlled on amlodipine 10mg  daily, carvedilol 12.5mg  twice daily, lisinopril 40 mg daily and hydrochlorothiazide 25mg  daily. Checks BP once a day and gives range of 116-120s/70s-80s.   HL: His cholesterol has been managed on Pravachol, with a last recorded LDL level was 83.      Patient Active Problem List   Diagnosis Date Noted   Hyperlipidemia associated with type 2 diabetes mellitus (HCC) 11/08/2019   Influenza vaccine needed 01/13/2019   Non-recurrent bilateral inguinal hernia without obstruction or gangrene 04/23/2018   Hemorrhoids 01/22/2018   Actinic keratosis due to exposure to sunlight 07/21/2017   Lower extremity edema 07/21/2017   Class 3 severe obesity due to excess calories without serious comorbidity with body mass index (BMI) of 50.0 to 59.9 in adult [facility]) 07/21/2017   Hyperlipidemia with target low density lipoprotein (LDL) cholesterol less than 100 mg/dL 18/84/1660   Hypertension 10/31/2016     Current Outpatient Medications on File Prior to Visit  Medication Sig Dispense Refill   amLODipine (NORVASC) 10 MG tablet Take 1 tablet (10 mg total) by mouth daily. 90 tablet 1   aspirin  EC 81 MG tablet Take 1 tablet (81 mg total) by mouth daily. 90 tablet 3   Blood Glucose Monitoring Suppl (TRUE METRIX METER) w/Device KIT Use as directed 1 kit 0   carvedilol (COREG) 12.5 MG tablet Take 1 tablet (12.5 mg total) by mouth 2 (two) times daily with a meal. 180 tablet 1   Docusate Sodium (STOOL SOFTENER LAXATIVE PO) Take 100 mg by mouth. Takes 2 to 4 capsules daily as needed.     hydrochlorothiazide (HYDRODIURIL) 25 MG tablet Take 1 tablet (25 mg total) by mouth daily. 90 tablet 1    lisinopril (ZESTRIL) 40 MG tablet Take 1 tablet (40 mg total) by mouth daily. 90 tablet 1   pravastatin (PRAVACHOL) 40 MG tablet Take 1 tablet (40 mg total) by mouth daily. 90 tablet 1   oxyCODONE (OXY IR/ROXICODONE) 5 MG immediate release tablet Take 1-2 tablets (5-10 mg total) by mouth every 6 (six) hours as needed for severe pain. (Patient not taking: Reported on 04/20/2023) 30 tablet 0   No current facility-administered medications on file prior to visit.    No Known Allergies  Social History   Socioeconomic History   Marital status: Single    Spouse name: Not on file   Number of children: Not on file   Years of education: Not on file   Highest education level: Not on file  Occupational History   Not on file  Tobacco Use   Smoking status: Never   Smokeless tobacco: Never  Vaping Use   Vaping status: Never Used  Substance and Sexual Activity   Alcohol use: No   Drug use: No   Sexual activity: Not on file  Other Topics Concern   Not on file  Social History Narrative   Not on file   Social Drivers of Health   Financial Resource Strain: Medium Risk (04/20/2023)   Overall Financial Resource Strain (CARDIA)    Difficulty of Paying Living Expenses: Somewhat hard  Food Insecurity: No Food Insecurity (04/20/2023)   Hunger Vital Sign    Worried About Running Out of Food in the Last Year: Never true    Ran Out of Food in the Last Year: Never true  Transportation Needs: No Transportation Needs (04/20/2023)   PRAPARE - Administrator, Civil Service (Medical): No    Lack of Transportation (Non-Medical): No  Physical Activity: Inactive (04/20/2023)   Exercise Vital Sign    Days of Exercise per Week: 0 days    Minutes of Exercise per Session: 0 min  Stress: No Stress Concern Present (04/20/2023)   [employer] of Occupational Health - Occupational Stress Questionnaire    Feeling of Stress : Not at all  Social Connections: Moderately Isolated (04/20/2023)    Social Connection and Isolation Panel [NHANES]    Frequency of Communication with Friends and Family: More than three times a week    Frequency of Social Gatherings with Friends and Family: Once a week    Attends Religious Services: Never    Database administrator or Organizations: No    Attends Engineer, structural: 1 to 4 times per year    Marital Status: Never married  Intimate Partner Violence: Not At Risk (04/20/2023)   Humiliation, Afraid, Rape, and Kick questionnaire    Fear of Current or Ex-Partner: No    Emotionally Abused: No    Physically Abused: No    Sexually Abused: No    Family History  Problem Relation Age of Onset  Heart disease Mother    Lung cancer Father    Colon cancer Neg Hx    Esophageal cancer Neg Hx    Liver cancer Neg Hx    Pancreatic cancer Neg Hx    Rectal cancer Neg Hx    Stomach cancer Neg Hx     Past Surgical History:  Procedure Laterality Date   COLONOSCOPY     ~5 yrs ago   FISTULOTOMY N/A 12/12/2021   Procedure: SETON PLACEMENT;  Surgeon: Romie Levee, MD;  Location: [facility];  Service: General;  Laterality: N/A;   LIGATION OF INTERNAL FISTULA TRACT N/A 05/22/2022   Procedure: LIGATION OF INTERNAL FISTULA TRACT;  Surgeon: Romie Levee, MD;  Location: [facility];  Service: General;  Laterality: N/A;   NO PAST SURGERIES      ROS: Review of Systems Negative except as stated above  PHYSICAL EXAM: BP 132/78   Pulse 76   Temp 97.8 F (36.6 C) (Oral)   Ht 5\' 7"  (1.702 m)   Wt (!) 309 lb (140.2 kg)   SpO2 96%   BMI 48.40 kg/m   Wt Readings from Last 3 Encounters:  04/20/23 (!) 309 lb (140.2 kg)  12/19/22 289 lb (131.1 kg)  05/22/22 299 lb 9.6 oz (135.9 kg)    Physical Exam  General appearance - alert, well appearing, morbidly obese older caucasian male and in no distress Mental status - normal mood, behavior, speech, dress, motor activity, and thought processes Chest - clear to  auscultation, no wheezes, rales or rhonchi, symmetric air entry Heart - normal rate, regular rhythm, normal S1, S2, no murmurs, rubs, clicks or gallops GU Male -CMA Josiah Lobo is present as chaperone: No mass or hernia palpated in the area on either side. Extremities - peripheral pulses normal, no pedal edema, no clubbing or cyanosis     Latest Ref Rng & Units 12/19/2022    4:34 PM 05/22/2022    6:09 AM 12/12/2021   10:33 AM  CMP  Glucose 70 - 99 mg/dL 161  096  045   BUN 8 - 27 mg/dL 17  12  9    Creatinine 0.76 - 1.27 mg/dL 4.09  8.11  9.14   Sodium 134 - 144 mmol/L 142  138  139   Potassium 3.5 - 5.2 mmol/L 3.9  3.6  3.3   Chloride 96 - 106 mmol/L 100  98  97   CO2 20 - 29 mmol/L 25     Calcium 8.6 - 10.2 mg/dL 78.2     Total Protein 6.0 - 8.5 g/dL 7.5     Total Bilirubin 0.0 - 1.2 mg/dL 0.5     Alkaline Phos 44 - 121 IU/L 47     AST 0 - 40 IU/L 27     ALT 0 - 44 IU/L 25      Lipid Panel     Component Value Date/Time   CHOL 145 12/19/2022 1634   TRIG 144 12/19/2022 1634   HDL 37 (L) 12/19/2022 1634   CHOLHDL 3.9 12/19/2022 1634   LDLCALC 83 12/19/2022 1634    CBC    Component Value Date/Time   WBC 10.3 12/19/2022 1634   WBC 12.1 (H) 09/23/2016 0953   RBC 5.10 12/19/2022 1634   RBC 4.66 09/23/2016 0953   HGB 15.5 12/19/2022 1634   HCT 46.1 12/19/2022 1634   PLT 226 12/19/2022 1634   MCV 90 12/19/2022 1634   MCH 30.4 12/19/2022 1634   MCH 29.2  09/23/2016 0953   MCHC 33.6 12/19/2022 1634   MCHC 32.1 09/23/2016 0953   RDW 12.8 12/19/2022 1634    ASSESSMENT AND PLAN: 1. Type 2 diabetes mellitus with morbid obesity (HCC) (Primary) Not at goal and 20 lb wgh gain since last visit due to dietary indiscretion. Dietary counseling given. He is not tolerating metformin anymore due to diarrhea/loose stools and would like to try another agent. Stop Metformin. We discussed trying him with a GLP-1 agonist.  Looks like Trulicity is preferred on his insurance.  I went over with him  how the medication works and potential side effects including severe diarrhea/constipation, nausea, vomiting, pancreatitis, bowel blockage etc.  Advised that if he develops any vomiting, abdominal pain, vomiting and abdominal pain, severe diarrhea or constipation on the medication, he should stop it and be seen.  Advised that the medicine will help in bringing about some weight loss decreasing his appetite.  Patient was initially reluctant due to the fact that he does not like sticking himself but eventually agreed to try the medication.  I have requested that the pharmacist show him how to give himself the injection once the prescription is filled.  Advised that it is a once weekly injection. -Check blood sugars once to twice a day before meals and bring in readings on next visit.  We will bring him back in several weeks to see how he is tolerating the Trulicity. - POCT glycosylated hemoglobin (Hb A1C) - POCT glucose (manual entry) - TRUEplus Lancets 28G MISC; USE AS DIRECTED  Dispense: 100 each; Refill: 3 - glucose blood (TRUE METRIX BLOOD GLUCOSE TEST) test strip; Use as instructed  Dispense: 100 each; Refill: 3 - Dulaglutide (TRULICITY) 0.75 MG/0.5ML SOAJ; Inject 0.75 mg into the skin once a week.  Dispense: 2 mL; Refill: 2 - Microalbumin / creatinine urine ratio  2. Long-term (current) use of injectable non-insulin antidiabetic drugs See #1 above  3. Hypertension associated with diabetes (HCC) Close to goal today on repeat.  Home blood pressure readings are good.  Continue carvedilol 12.5 mg twice a day, HCTZ 25 mg daily and lisinopril 40 mg daily and amlodipine 10 mg daily  4. Hyperlipidemia associated with type 2 diabetes mellitus (HCC) Continue pravastatin 40 mg daily  5. Non-recurrent bilateral inguinal hernia without obstruction or gangrene Due to large body habitus, it was difficult to appreciate/palpate hernia in the groin area.  However it was seen on imaging CT in 2019 in the  bilateral inguinal area.  Symptoms are becoming bothersome.  Advised patient to avoid heavy lifting.  Will refer him to general surgeon to evaluate. - Ambulatory referral to General Surgery  6. Encounter for immunization - Flu vaccine trivalent PF, 6mos and older(Flulaval,Afluria,Fluarix,Fluzone)    Patient was given the opportunity to ask questions.  Patient verbalized understanding of the plan and was able to repeat key elements of the plan.   This documentation was completed using Paediatric nurse.  Any transcriptional errors are unintentional.  Orders Placed This Encounter  Procedures   Flu vaccine trivalent PF, 6mos and older(Flulaval,Afluria,Fluarix,Fluzone)   Microalbumin / creatinine urine ratio   Ambulatory referral to General Surgery   POCT glycosylated hemoglobin (Hb A1C)   POCT glucose (manual entry)     Requested Prescriptions   Signed Prescriptions Disp Refills   TRUEplus Lancets 28G MISC 100 each 3    Sig: USE AS DIRECTED   glucose blood (TRUE METRIX BLOOD GLUCOSE TEST) test strip 100 each 3    Sig:  Use as instructed   Dulaglutide (TRULICITY) 0.75 MG/0.5ML SOAJ 2 mL 2    Sig: Inject 0.75 mg into the skin once a week.    Return in about 2 months (around 06/21/2023).  Jonah Blue, MD, FACP

## 2023-04-21 ENCOUNTER — Other Ambulatory Visit: Payer: Self-pay | Admitting: Pharmacist

## 2023-04-21 ENCOUNTER — Other Ambulatory Visit: Payer: Self-pay

## 2023-04-21 MED ORDER — ACCU-CHEK GUIDE W/DEVICE KIT
PACK | 0 refills | Status: AC
  Filled 2023-04-21 (×2): qty 1, 30d supply, fill #0

## 2023-04-21 MED ORDER — ACCU-CHEK GUIDE TEST VI STRP
ORAL_STRIP | 6 refills | Status: DC
  Filled 2023-04-21: qty 100, fill #0
  Filled 2023-04-21: qty 100, 33d supply, fill #0
  Filled 2023-05-22: qty 100, 33d supply, fill #1
  Filled 2023-07-29: qty 100, 33d supply, fill #2

## 2023-04-21 MED ORDER — ACCU-CHEK SOFTCLIX LANCETS MISC
6 refills | Status: AC
  Filled 2023-04-21: qty 100, 33d supply, fill #0
  Filled 2023-05-22: qty 100, 33d supply, fill #1
  Filled 2023-07-29: qty 100, 33d supply, fill #2
  Filled 2023-12-28: qty 100, 33d supply, fill #3

## 2023-04-22 ENCOUNTER — Other Ambulatory Visit: Payer: Self-pay

## 2023-04-22 LAB — MICROALBUMIN / CREATININE URINE RATIO
Creatinine, Urine: 87.7 mg/dL
Microalb/Creat Ratio: 12 mg/g{creat} (ref 0–29)
Microalbumin, Urine: 10.8 ug/mL

## 2023-04-24 ENCOUNTER — Other Ambulatory Visit: Payer: Self-pay

## 2023-05-07 DIAGNOSIS — K4020 Bilateral inguinal hernia, without obstruction or gangrene, not specified as recurrent: Secondary | ICD-10-CM | POA: Diagnosis not present

## 2023-05-22 ENCOUNTER — Other Ambulatory Visit: Payer: Self-pay

## 2023-06-02 DIAGNOSIS — R972 Elevated prostate specific antigen [PSA]: Secondary | ICD-10-CM | POA: Diagnosis not present

## 2023-06-05 ENCOUNTER — Encounter (HOSPITAL_COMMUNITY): Payer: Self-pay

## 2023-06-05 ENCOUNTER — Ambulatory Visit: Payer: Self-pay | Admitting: General Surgery

## 2023-06-05 NOTE — Pre-Procedure Instructions (Signed)
Surgical Instructions   Your procedure is scheduled on Wednesday, February 5th. Report to [facility] Main Entrance "A" at 10:00 A.M., then check in with the Admitting office. Any questions or running late day of surgery: call [phone]  Questions prior to your surgery date: call [phone], Monday-Friday, 8am-4pm. If you experience any cold or flu symptoms such as cough, fever, chills, shortness of breath, etc. between now and your scheduled surgery, please notify us at the above number.     Remember:  Do not eat after midnight the night before your surgery   You may drink clear liquids until 09:00 AM the morning of your surgery.   Clear liquids allowed are: Water, Non-Citrus Juices (without pulp), Carbonated Beverages, Clear Tea (no milk, honey, etc.), Black Coffee Only (NO MILK, CREAM OR POWDERED CREAMER of any kind), and Gatorade.    Take these medicines the morning of surgery with A SIP OF WATER  amLODipine (NORVASC)  carvedilol (COREG)  pravastatin (PRAVACHOL)    Follow your surgeon's instructions on when to stop Aspirin.  If no instructions were given by your surgeon then you will need to call the office to get those instructions.    One week prior to surgery, STOP taking any Aleve, Naproxen, Ibuprofen, Motrin, Advil, Goody's, BC's, all herbal medications, fish oil, and non-prescription vitamins.   WHAT DO I DO ABOUT MY DIABETES MEDICATION?   STOP taking Dulaglutide (TRULICITY) 7 days prior to surgery. Last dose on or before 1/28.    HOW TO MANAGE YOUR DIABETES BEFORE AND AFTER SURGERY  Why is it important to control my blood sugar before and after surgery? Improving blood sugar levels before and after surgery helps healing and can limit problems. A way of improving blood sugar control is eating a healthy diet by:  Eating less sugar and carbohydrates  Increasing activity/exercise  Talking with your doctor about reaching your blood sugar goals High blood sugars  (greater than 180 mg/dL) can raise your risk of infections and slow your recovery, so you will need to focus on controlling your diabetes during the weeks before surgery. Make sure that the doctor who takes care of your diabetes knows about your planned surgery including the date and location.  How do I manage my blood sugar before surgery? Check your blood sugar at least 4 times a day, starting 2 days before surgery, to make sure that the level is not too high or low.  Check your blood sugar the morning of your surgery when you wake up and every 2 hours until you get to the Short Stay unit.  If your blood sugar is less than 70 mg/dL, you will need to treat for low blood sugar: Do not take insulin. Treat a low blood sugar (less than 70 mg/dL) with  cup of clear juice (cranberry or apple), 4 glucose tablets, OR glucose gel. Recheck blood sugar in 15 minutes after treatment (to make sure it is greater than 70 mg/dL). If your blood sugar is not greater than 70 mg/dL on recheck, call [phone] for further instructions. Report your blood sugar to the short stay nurse when you get to Short Stay.  If you are admitted to the hospital after surgery: Your blood sugar will be checked by the staff and you will probably be given insulin after surgery (instead of oral diabetes medicines) to make sure you have good blood sugar levels. The goal for blood sugar control after surgery is 80-180 mg/dL.  Do NOT Smoke (Tobacco/Vaping) for 24 hours prior to your procedure.  If you use a CPAP at night, you may bring your mask/headgear for your overnight stay.   You will be asked to remove any contacts, glasses, piercing's, hearing aid's, dentures/partials prior to surgery. Please bring cases for these items if needed.    Patients discharged the day of surgery will not be allowed to drive home, and someone needs to stay with them for 24 hours.  SURGICAL WAITING ROOM VISITATION Patients may  have no more than 2 support people in the waiting area - these visitors may rotate.   Pre-op nurse will coordinate an appropriate time for 1 ADULT support person, who may not rotate, to accompany patient in pre-op.  Children under the age of 42 must have an adult with them who is not the patient and must remain in the main waiting area with an adult.  If the patient needs to stay at the hospital during part of their recovery, the visitor guidelines for inpatient rooms apply.  Please refer to the [facility] website for the visitor guidelines for any additional information.   If you received a COVID test during your pre-op visit  it is requested that you wear a mask when out in public, stay away from anyone that may not be feeling well and notify your surgeon if you develop symptoms. If you have been in contact with anyone that has tested positive in the last 10 days please notify you surgeon.      Pre-operative CHG Bathing Instructions   You can play a key role in reducing the risk of infection after surgery. Your skin needs to be as free of germs as possible. You can reduce the number of germs on your skin by washing with CHG (chlorhexidine gluconate) soap before surgery. CHG is an antiseptic soap that kills germs and continues to kill germs even after washing.   DO NOT use if you have an allergy to chlorhexidine/CHG or antibacterial soaps. If your skin becomes reddened or irritated, stop using the CHG and notify one of our RNs at [phone].              TAKE A SHOWER THE NIGHT BEFORE SURGERY AND THE DAY OF SURGERY    Please keep in mind the following:  DO NOT shave, including legs and underarms, 48 hours prior to surgery.   You may shave your face before/day of surgery.  Place clean sheets on your bed the night before surgery Use a clean washcloth (not used since being washed) for each shower. DO NOT sleep with pet's night before surgery.  CHG Shower Instructions:  Wash your face  and private area with normal soap. If you choose to wash your hair, wash first with your normal shampoo.  After you use shampoo/soap, rinse your hair and body thoroughly to remove shampoo/soap residue.  Turn the water OFF and apply half the bottle of CHG soap to a CLEAN washcloth.  Apply CHG soap ONLY FROM YOUR NECK DOWN TO YOUR TOES (washing for 3-5 minutes)  DO NOT use CHG soap on face, private areas, open wounds, or sores.  Pay special attention to the area where your surgery is being performed.  If you are having back surgery, having someone wash your back for you may be helpful. Wait 2 minutes after CHG soap is applied, then you may rinse off the CHG soap.  Pat dry with a clean towel  Put on clean pajamas  Additional instructions for the day of surgery: DO NOT APPLY any lotions, deodorants, cologne, or perfumes.   Do not wear jewelry or makeup Do not wear nail polish, gel polish, artificial nails, or any other type of covering on natural nails (fingers and toes) Do not bring valuables to the hospital. [facility] is not responsible for valuables/personal belongings. Put on clean/comfortable clothes.  Please brush your teeth.  Ask your nurse before applying any prescription medications to the skin.

## 2023-06-08 ENCOUNTER — Other Ambulatory Visit: Payer: Self-pay | Admitting: Urology

## 2023-06-08 ENCOUNTER — Other Ambulatory Visit: Payer: Self-pay

## 2023-06-08 ENCOUNTER — Encounter (HOSPITAL_COMMUNITY)
Admission: RE | Admit: 2023-06-08 | Discharge: 2023-06-08 | Disposition: A | Discharge status: Home / Self Care | Payer: Medicaid Other | Source: Ambulatory Visit | Attending: General Surgery | Admitting: General Surgery

## 2023-06-08 ENCOUNTER — Encounter (HOSPITAL_COMMUNITY): Payer: Self-pay

## 2023-06-08 VITALS — BP 125/85 | HR 86 | Temp 98.5°F | Resp 20 | Ht 67.0 in | Wt 303.8 lb

## 2023-06-08 DIAGNOSIS — E119 Type 2 diabetes mellitus without complications: Secondary | ICD-10-CM | POA: Diagnosis not present

## 2023-06-08 DIAGNOSIS — I4510 Unspecified right bundle-branch block: Secondary | ICD-10-CM | POA: Diagnosis not present

## 2023-06-08 DIAGNOSIS — Z87442 Personal history of urinary calculi: Secondary | ICD-10-CM | POA: Insufficient documentation

## 2023-06-08 DIAGNOSIS — R9431 Abnormal electrocardiogram [ECG] [EKG]: Secondary | ICD-10-CM | POA: Diagnosis not present

## 2023-06-08 DIAGNOSIS — Z01810 Encounter for preprocedural cardiovascular examination: Secondary | ICD-10-CM | POA: Diagnosis present

## 2023-06-08 DIAGNOSIS — I10 Essential (primary) hypertension: Secondary | ICD-10-CM | POA: Insufficient documentation

## 2023-06-08 DIAGNOSIS — E785 Hyperlipidemia, unspecified: Secondary | ICD-10-CM | POA: Diagnosis not present

## 2023-06-08 DIAGNOSIS — Z9889 Other specified postprocedural states: Secondary | ICD-10-CM | POA: Diagnosis not present

## 2023-06-08 DIAGNOSIS — Z7985 Long-term (current) use of injectable non-insulin antidiabetic drugs: Secondary | ICD-10-CM | POA: Insufficient documentation

## 2023-06-08 DIAGNOSIS — K219 Gastro-esophageal reflux disease without esophagitis: Secondary | ICD-10-CM | POA: Insufficient documentation

## 2023-06-08 DIAGNOSIS — Z01818 Encounter for other preprocedural examination: Secondary | ICD-10-CM | POA: Diagnosis not present

## 2023-06-08 DIAGNOSIS — Z01812 Encounter for preprocedural laboratory examination: Secondary | ICD-10-CM | POA: Diagnosis present

## 2023-06-08 DIAGNOSIS — K4020 Bilateral inguinal hernia, without obstruction or gangrene, not specified as recurrent: Secondary | ICD-10-CM | POA: Insufficient documentation

## 2023-06-08 DIAGNOSIS — R972 Elevated prostate specific antigen [PSA]: Secondary | ICD-10-CM

## 2023-06-08 HISTORY — DX: Personal history of urinary calculi: Z87.442

## 2023-06-08 HISTORY — DX: Cardiac arrhythmia, unspecified: I49.9

## 2023-06-08 LAB — BASIC METABOLIC PANEL
Anion gap: 11 (ref 5–15)
BUN: 10 mg/dL (ref 8–23)
CO2: 31 mmol/L (ref 22–32)
Calcium: 9.8 mg/dL (ref 8.9–10.3)
Chloride: 98 mmol/L (ref 98–111)
Creatinine, Ser: 1.07 mg/dL (ref 0.61–1.24)
GFR, Estimated: >60 mL/min (ref >60)
Glucose, Bld: 141 mg/dL — ABNORMAL HIGH (ref 70–99)
Potassium: 3.7 mmol/L (ref 3.5–5.1)
Sodium: 140 mmol/L (ref 135–145)

## 2023-06-08 LAB — CBC
HCT: 47.5 % (ref 39.0–52.0)
Hemoglobin: 15.9 g/dL (ref 13.0–17.0)
MCH: 30.8 pg (ref 26.0–34.0)
MCHC: 33.5 g/dL (ref 30.0–36.0)
MCV: 91.9 fL (ref 80.0–100.0)
Platelets: 210 10*3/uL (ref 150–400)
RBC: 5.17 MIL/uL (ref 4.22–5.81)
RDW: 12.8 % (ref 11.5–15.5)
WBC: 8.7 10*3/uL (ref 4.0–10.5)
nRBC: 0 % (ref 0.0–0.2)

## 2023-06-08 LAB — GLUCOSE, CAPILLARY: Glucose-Capillary: 151 mg/dL — ABNORMAL HIGH (ref 70–99)

## 2023-06-08 NOTE — Progress Notes (Signed)
PCP - Dr. Jonah Blue Cardiologist - Per pt, saw someone at [facility] 20+ years ago for CP. All work-up normal  PPM/ICD - Denies Device Orders - n/a Rep Notified - n/a  Chest x-ray - n/a EKG - 06/08/2023 Stress Test - Per pt, 20+ years ago. Result normal ECHO - Denies Cardiac Cath - Denies  Sleep Study - Denies - STOPBANG score 7. PCP made aware of score.  Pt is DM2. He checks his blood sugar daily. Normal fasting range is 120-150. CBG at pre-op appointment 151. Last A1c 8.3 on 04/20/23.  Last dose of GLP1 agonist- Last dose of Trulicity 1/31. GLP1 instructions: Pt instructed to not take any additional doses prior to surgery  Blood Thinner Instructions: n/a Aspirin Instructions: Pt ran out of ASA last week and last dose was 1/29. Pt instructed to NOT take anymore prior to surgery  ERAS Protcol - Clear liquids until 0900 morning of surgery PRE-SURGERY Ensure or G2- n/a  COVID TEST- n/a   Anesthesia review: Yes. Hx of HTN, DM with abnormal A1c result, high STOPBANG score and abnormal EKG review  Patient denies shortness of breath, fever, cough and chest pain at PAT appointment. Pt denies any respiratory illness/infection in the last two months.   All instructions explained to the patient, with a verbal understanding of the material. Patient agrees to go over the instructions while at home for a better understanding. Patient also instructed to self quarantine after being tested for COVID-19. The opportunity to ask questions was provided.

## 2023-06-08 NOTE — Progress Notes (Signed)
Dr. Jonah Blue, patients PCP, made aware of STOPBANG score of 7 at pre-surgical appointment.

## 2023-06-09 NOTE — Anesthesia Preprocedure Evaluation (Signed)
Anesthesia Evaluation  Patient identified by MRN, date of birth, ID band Patient awake    Reviewed: Allergy & Precautions, H&P , NPO status , Patient's Chart, lab work & pertinent test results  Airway Mallampati: IV  TM Distance: >3 FB Neck ROM: Full    Dental  (+) Poor Dentition, Dental Advisory Given   Pulmonary  Has never had sleep study, snores at night   Pulmonary exam normal breath sounds clear to auscultation       Cardiovascular hypertension (146/99 preop), Pt. on medications and Pt. on home beta blockers Normal cardiovascular exam Rhythm:Regular Rate:Normal     Neuro/Psych negative neurological ROS  negative psych ROS   GI/Hepatic Neg liver ROS,GERD  Controlled,,  Endo/Other  diabetes, Well Controlled, Type 2  Class 4 obesityBMI 47  Renal/GU negative Renal ROS  negative genitourinary   Musculoskeletal negative musculoskeletal ROS (+)    Abdominal  (+) + obese  Peds negative pediatric ROS (+)  Hematology negative hematology ROS (+)   Anesthesia Other Findings Trulicity 5d ago  Reproductive/Obstetrics negative OB ROS                             Anesthesia Physical Anesthesia Plan  ASA: 3  Anesthesia Plan: General and Regional   Post-op Pain Management: Tylenol PO (pre-op)*, Regional block* and Toradol IV (intra-op)*   Induction: Intravenous and Rapid sequence  PONV Risk Score and Plan: 3 and Ondansetron, Dexamethasone, Treatment may vary due to age or medical condition and Midazolam  Airway Management Planned: Oral ETT and Video Laryngoscope Planned  Additional Equipment: None  Intra-op Plan:   Post-operative Plan: Extubation in OR  Informed Consent: I have reviewed the patients History and Physical, chart, labs and discussed the procedure including the risks, benefits and alternatives for the proposed anesthesia with the patient or authorized representative who has  indicated his/her understanding and acceptance.     Dental advisory given  Plan Discussed with: CRNA  Anesthesia Plan Comments: (  )       Anesthesia Quick Evaluation

## 2023-06-09 NOTE — Progress Notes (Signed)
Anesthesia Chart Review:  Case: [id number] Date/Time: 06/10/23 1145   Procedure: XI ROBOTIC ASSISTED BILATERAL INGUINAL HERNIA WITH MESH (Bilateral) - GEN AND TAP BLOCK   Anesthesia type: General   Pre-op diagnosis: BILATERAL INGUINAL HERNIA   Location: [facility] ROOM 10 / [facility]   Surgeons: Moise Boring, MD       DISCUSSION: Patient is a 63 year old male scheduled for the above procedure. Referred by his PCP Marcine Matar, MD.  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 BMI is consistent with morbid obesity.  Preoperative EKG and labs noted. Known RBBB. A1c 8.3% on 04/20/23 in setting of weight gain due to dietary indiscretion. Metformin discontinued to to diarrhea side effect. Trulicity prescribed.  Last Trulicity 06/05/23 and advised to hold until after surgery. General anesthesia is planned. Last ASA reported as 06/03/23.   Anesthesia team to evaluate on the day of surgery.   VS: BP 125/85   Pulse 86   Temp 36.9 C   Resp 20   Ht 5\' 7"  (1.702 m)   Wt (!) 137.8 kg   SpO2 95%   BMI 47.58 kg/m   PROVIDERS: Marcine Matar, MD is PCP    LABS: Preoperative labs noted. A1c 8.3% on 04/20/23. LFTs normal on 12/19/22.  (all labs ordered are listed, but only abnormal results are displayed)  Labs Reviewed  GLUCOSE, CAPILLARY - Abnormal; Notable for the following components:      Result Value   Glucose-Capillary 151 (*)    All other components within normal limits  BASIC METABOLIC PANEL - Abnormal; Notable for the following components:   Glucose, Bld 141 (*)    All other components within normal limits  CBC     EKG: 06/08/23: Normal sinus rhythm Right bundle branch block Left anterior fascicular block Bifascicular block Abnormal ECG No significant change since last tracing Confirmed by Chilton Si ([id number]) on 06/08/2023  10:45:16 PM   CV: Reported a remote history of a normal stress test > 20 years ago with [facility].  Past Medical History:  Diagnosis Date   Bilateral inguinal hernia    per 09-28-2027 ct abdomen with contrast epic   Diabetes (HCC) Type 2    Dysrhythmia    RBBB   GERD (gastroesophageal reflux disease)    Hemorrhoids    History of kidney stones    Hyperlipidemia    Hypertension     Past Surgical History:  Procedure Laterality Date   COLONOSCOPY  05/2017   FISTULOTOMY N/A 12/12/2021   Procedure: SETON PLACEMENT;  Surgeon: Romie Levee, MD;  Location: [facility];  Service: General;  Laterality: N/A;   LIGATION OF INTERNAL FISTULA TRACT N/A 05/22/2022   Procedure: LIGATION OF INTERNAL FISTULA TRACT;  Surgeon: Romie Levee, MD;  Location: [facility];  Service: General;  Laterality: N/A;    MEDICATIONS:  Accu-Chek Softclix Lancets lancets   amLODipine (NORVASC) 10 MG tablet   aspirin EC 81 MG tablet   Blood Glucose Monitoring Suppl (ACCU-CHEK GUIDE) w/Device KIT   carvedilol (COREG) 12.5 MG tablet   Docusate Sodium (STOOL SOFTENER LAXATIVE PO)   Dulaglutide (TRULICITY) 0.75 MG/0.5ML SOAJ   glucose blood (ACCU-CHEK GUIDE TEST) test strip   hydrochlorothiazide (HYDRODIURIL) 25 MG tablet   Hydrocortisone (PREPARATION H EX)   ibuprofen (ADVIL) 200 MG tablet   lisinopril (ZESTRIL) 40 MG tablet  oxyCODONE (OXY IR/ROXICODONE) 5 MG immediate release tablet   pravastatin (PRAVACHOL) 40 MG tablet   No current facility-administered medications for this encounter.     Shonna Chock, PA-C Surgical Short Stay/Anesthesiology [facility] Phone [phone] [facility] Phone [phone] 06/09/2023 10:37 AM

## 2023-06-10 ENCOUNTER — Encounter (HOSPITAL_COMMUNITY)
Admission: RE | Disposition: A | Discharge status: Home / Self Care | Payer: Self-pay | Source: Home / Self Care | Attending: General Surgery

## 2023-06-10 ENCOUNTER — Other Ambulatory Visit: Payer: Self-pay

## 2023-06-10 ENCOUNTER — Ambulatory Visit (HOSPITAL_COMMUNITY): Payer: Self-pay | Admitting: Physician Assistant

## 2023-06-10 ENCOUNTER — Ambulatory Visit (HOSPITAL_COMMUNITY): Payer: Medicaid Other

## 2023-06-10 ENCOUNTER — Ambulatory Visit (HOSPITAL_COMMUNITY)
Admission: RE | Admit: 2023-06-10 | Discharge: 2023-06-10 | Disposition: A | Discharge status: Home / Self Care | Payer: Medicaid Other | Attending: General Surgery | Admitting: General Surgery

## 2023-06-10 ENCOUNTER — Encounter (HOSPITAL_COMMUNITY): Payer: Self-pay | Admitting: General Surgery

## 2023-06-10 DIAGNOSIS — Z539 Procedure and treatment not carried out, unspecified reason: Secondary | ICD-10-CM | POA: Insufficient documentation

## 2023-06-10 DIAGNOSIS — E119 Type 2 diabetes mellitus without complications: Secondary | ICD-10-CM | POA: Diagnosis not present

## 2023-06-10 DIAGNOSIS — K4020 Bilateral inguinal hernia, without obstruction or gangrene, not specified as recurrent: Secondary | ICD-10-CM

## 2023-06-10 DIAGNOSIS — E785 Hyperlipidemia, unspecified: Secondary | ICD-10-CM

## 2023-06-10 DIAGNOSIS — I10 Essential (primary) hypertension: Secondary | ICD-10-CM | POA: Diagnosis not present

## 2023-06-10 DIAGNOSIS — G8918 Other acute postprocedural pain: Secondary | ICD-10-CM | POA: Diagnosis not present

## 2023-06-10 LAB — GLUCOSE, CAPILLARY
Glucose-Capillary: 160 mg/dL — ABNORMAL HIGH (ref 70–99)
Glucose-Capillary: 217 mg/dL — ABNORMAL HIGH (ref 70–99)
Glucose-Capillary: 207 mg/dL — ABNORMAL HIGH (ref 70–99)

## 2023-06-10 SURGERY — REPAIR, HERNIA, INGUINAL, BILATERAL, ROBOT-ASSISTED
Anesthesia: Regional | Site: Groin | Laterality: Bilateral

## 2023-06-10 MED ORDER — CHLORHEXIDINE GLUCONATE CLOTH 2 % EX PADS: 6.0000 | MEDICATED_PAD | Freq: Once | CUTANEOUS | Status: DC

## 2023-06-10 MED ORDER — MIDAZOLAM HCL 2 MG/2ML IJ SOLN
INTRAMUSCULAR | Status: AC
Start: 2023-06-10 — End: ?
  Filled 2023-06-10: qty 2

## 2023-06-10 MED ORDER — ROPIVACAINE HCL 5 MG/ML IJ SOLN
INTRAMUSCULAR | Status: DC | PRN
  Administered 2023-06-10: 30 mL via PERINEURAL

## 2023-06-10 MED ORDER — BUPIVACAINE HCL 0.25 % IJ SOLN
INTRAMUSCULAR | Status: DC | PRN
  Administered 2023-06-10: 11 mL

## 2023-06-10 MED ORDER — ONDANSETRON HCL 4 MG/2ML IJ SOLN
INTRAMUSCULAR | Status: AC
  Filled 2023-06-10: qty 2

## 2023-06-10 MED ORDER — FENTANYL CITRATE (PF) 100 MCG/2ML IJ SOLN
INTRAMUSCULAR | Status: AC
  Administered 2023-06-10: 100 ug via INTRAVENOUS
  Filled 2023-06-10: qty 2

## 2023-06-10 MED ORDER — ONDANSETRON HCL 4 MG/2ML IJ SOLN
INTRAMUSCULAR | Status: DC | PRN
  Administered 2023-06-10: 4 mg via INTRAVENOUS

## 2023-06-10 MED ORDER — INSULIN ASPART 100 UNIT/ML IJ SOLN
0.0000 [IU] | INTRAMUSCULAR | Status: DC | PRN
  Administered 2023-06-10: 2 [IU] via SUBCUTANEOUS

## 2023-06-10 MED ORDER — LIDOCAINE 2% (20 MG/ML) 5 ML SYRINGE
INTRAMUSCULAR | Status: AC
  Filled 2023-06-10: qty 5

## 2023-06-10 MED ORDER — BUPIVACAINE-EPINEPHRINE (PF) 0.25% -1:200000 IJ SOLN
INTRAMUSCULAR | Status: AC
  Filled 2023-06-10: qty 30

## 2023-06-10 MED ORDER — INSULIN ASPART 100 UNIT/ML IJ SOLN
INTRAMUSCULAR | Status: AC
  Filled 2023-06-10: qty 1

## 2023-06-10 MED ORDER — AMISULPRIDE (ANTIEMETIC) 5 MG/2ML IV SOLN: 10.0000 mg | Freq: Once | INTRAVENOUS | Status: DC | PRN

## 2023-06-10 MED ORDER — ACETAMINOPHEN 325 MG PO TABS
650.0000 mg | ORAL_TABLET | Freq: Four times a day (QID) | ORAL | 0 refills | Status: AC
  Filled 2023-06-10: qty 50, 7d supply, fill #0

## 2023-06-10 MED ORDER — ACETAMINOPHEN 500 MG PO TABS
1000.0000 mg | ORAL_TABLET | Freq: Once | ORAL | Status: AC
  Administered 2023-06-10: 1000 mg via ORAL
  Filled 2023-06-10: qty 2

## 2023-06-10 MED ORDER — HYDROMORPHONE HCL 1 MG/ML IJ SOLN
INTRAMUSCULAR | Status: AC
  Filled 2023-06-10: qty 1

## 2023-06-10 MED ORDER — 0.9 % SODIUM CHLORIDE (POUR BTL) OPTIME
TOPICAL | Status: DC | PRN
  Administered 2023-06-10: 1000 mL

## 2023-06-10 MED ORDER — FENTANYL CITRATE (PF) 250 MCG/5ML IJ SOLN
INTRAMUSCULAR | Status: AC
  Filled 2023-06-10: qty 5

## 2023-06-10 MED ORDER — DEXAMETHASONE SODIUM PHOSPHATE 10 MG/ML IJ SOLN
INTRAMUSCULAR | Status: DC | PRN
  Administered 2023-06-10: 5 mg via INTRAVENOUS

## 2023-06-10 MED ORDER — CHLORHEXIDINE GLUCONATE 0.12 % MT SOLN
15.0000 mL | Freq: Once | OROMUCOSAL | Status: AC
  Administered 2023-06-10: 15 mL via OROMUCOSAL
  Filled 2023-06-10: qty 15

## 2023-06-10 MED ORDER — INSULIN ASPART 100 UNIT/ML IJ SOLN
3.0000 [IU] | Freq: Once | INTRAMUSCULAR | Status: AC
  Administered 2023-06-10: 3 [IU] via SUBCUTANEOUS

## 2023-06-10 MED ORDER — LACTATED RINGERS IV SOLN
INTRAVENOUS | Status: DC
Start: 2023-06-10 — End: 2023-06-10

## 2023-06-10 MED ORDER — ROCURONIUM BROMIDE 10 MG/ML (PF) SYRINGE
PREFILLED_SYRINGE | INTRAVENOUS | Status: DC | PRN
  Administered 2023-06-10: 20 mg via INTRAVENOUS
  Administered 2023-06-10: 30 mg via INTRAVENOUS
  Administered 2023-06-10: 20 mg via INTRAVENOUS

## 2023-06-10 MED ORDER — ORAL CARE MOUTH RINSE: 15.0000 mL | Freq: Once | OROMUCOSAL | Status: AC

## 2023-06-10 MED ORDER — BUPIVACAINE LIPOSOME 1.3 % IJ SUSP
INTRAMUSCULAR | Status: DC | PRN
  Administered 2023-06-10: 10 mL via PERINEURAL

## 2023-06-10 MED ORDER — OXYCODONE HCL 5 MG PO TABS
5.0000 mg | ORAL_TABLET | Freq: Three times a day (TID) | ORAL | 0 refills | Status: AC | PRN
  Filled 2023-06-10: qty 12, 4d supply, fill #0

## 2023-06-10 MED ORDER — BUPIVACAINE LIPOSOME 1.3 % IJ SUSP
INTRAMUSCULAR | Status: AC
  Filled 2023-06-10: qty 20

## 2023-06-10 MED ORDER — HYDROMORPHONE HCL 1 MG/ML IJ SOLN
0.2500 mg | INTRAMUSCULAR | Status: DC | PRN
  Administered 2023-06-10 (×2): 0.5 mg via INTRAVENOUS

## 2023-06-10 MED ORDER — MIDAZOLAM HCL 2 MG/2ML IJ SOLN
INTRAMUSCULAR | Status: AC
  Administered 2023-06-10: 2 mg via INTRAVENOUS
  Filled 2023-06-10: qty 2

## 2023-06-10 MED ORDER — PHENYLEPHRINE HCL-NACL 20-0.9 MG/250ML-% IV SOLN
INTRAVENOUS | Status: DC | PRN
  Administered 2023-06-10: 25 ug/min via INTRAVENOUS

## 2023-06-10 MED ORDER — LIDOCAINE 2% (20 MG/ML) 5 ML SYRINGE
INTRAMUSCULAR | Status: DC | PRN
  Administered 2023-06-10: 40 mg via INTRAVENOUS

## 2023-06-10 MED ORDER — FENTANYL CITRATE (PF) 100 MCG/2ML IJ SOLN: 100.0000 ug | Freq: Once | INTRAMUSCULAR | Status: AC

## 2023-06-10 MED ORDER — PROPOFOL 10 MG/ML IV BOLUS
INTRAVENOUS | Status: AC
  Filled 2023-06-10: qty 20

## 2023-06-10 MED ORDER — OXYCODONE HCL 5 MG PO TABS: 5.0000 mg | ORAL_TABLET | Freq: Once | ORAL | Status: DC | PRN

## 2023-06-10 MED ORDER — SUCCINYLCHOLINE CHLORIDE 200 MG/10ML IV SOSY
PREFILLED_SYRINGE | INTRAVENOUS | Status: DC | PRN
  Administered 2023-06-10: 200 mg via INTRAVENOUS

## 2023-06-10 MED ORDER — OXYCODONE HCL 5 MG/5ML PO SOLN: 5.0000 mg | Freq: Once | ORAL | Status: DC | PRN

## 2023-06-10 MED ORDER — PROPOFOL 10 MG/ML IV BOLUS
INTRAVENOUS | Status: DC | PRN
  Administered 2023-06-10: 300 mg via INTRAVENOUS

## 2023-06-10 MED ORDER — IBUPROFEN 200 MG PO TABS
600.0000 mg | ORAL_TABLET | Freq: Four times a day (QID) | ORAL | 0 refills | Status: AC
  Filled 2023-06-10: qty 75, 7d supply, fill #0

## 2023-06-10 MED ORDER — MIDAZOLAM HCL 2 MG/2ML IJ SOLN: 2.0000 mg | Freq: Once | INTRAMUSCULAR | Status: AC

## 2023-06-10 MED ORDER — BUPIVACAINE LIPOSOME 1.3 % IJ SUSP: 20.0000 mL | Freq: Once | INTRAMUSCULAR | Status: DC

## 2023-06-10 MED ORDER — CEFAZOLIN SODIUM-DEXTROSE 3-4 GM/150ML-% IV SOLN
3.0000 g | INTRAVENOUS | Status: AC
  Administered 2023-06-10: 3 g via INTRAVENOUS
  Filled 2023-06-10: qty 150

## 2023-06-10 MED ORDER — DEXAMETHASONE SODIUM PHOSPHATE 10 MG/ML IJ SOLN
INTRAMUSCULAR | Status: AC
  Filled 2023-06-10: qty 1

## 2023-06-10 MED ORDER — ONDANSETRON HCL 4 MG/2ML IJ SOLN: 4.0000 mg | Freq: Once | INTRAMUSCULAR | Status: DC | PRN

## 2023-06-10 SURGICAL SUPPLY — 41 items
BAG COUNTER SPONGE SURGICOUNT (BAG) IMPLANT
CHLORAPREP W/TINT 26 (MISCELLANEOUS) ×1 IMPLANT
COVER MAYO STAND STRL (DRAPES) ×1 IMPLANT
COVER SURGICAL LIGHT HANDLE (MISCELLANEOUS) ×1 IMPLANT
COVER TIP SHEARS 8 DVNC (MISCELLANEOUS) ×1 IMPLANT
DEFOGGER SCOPE WARMER CLEARIFY (MISCELLANEOUS) IMPLANT
DERMABOND ADVANCED .7 DNX12 (GAUZE/BANDAGES/DRESSINGS) ×1 IMPLANT
DRAPE ARM DVNC X/XI (DISPOSABLE) ×4 IMPLANT
DRAPE COLUMN DVNC XI (DISPOSABLE) ×1 IMPLANT
DRAPE CV SPLIT W-CLR ANES SCRN (DRAPES) ×1 IMPLANT
DRAPE SURG ORHT 6 SPLT 77X108 (DRAPES) ×1 IMPLANT
DRIVER NDL MEGA SUTCUT DVNCXI (INSTRUMENTS) ×1 IMPLANT
DRIVER NDLE MEGA SUTCUT DVNCXI (INSTRUMENTS) ×1
ELECT REM PT RETURN 9FT ADLT (ELECTROSURGICAL) ×1
ELECTRODE REM PT RTRN 9FT ADLT (ELECTROSURGICAL) ×1 IMPLANT
FORCEPS BPLR 8 MD DVNC XI (FORCEP) IMPLANT
FORCEPS BPLR FENES DVNC XI (FORCEP) ×1 IMPLANT
GLOVE BIO SURGEON STRL SZ7 (GLOVE) ×1 IMPLANT
GOWN STRL REUS W/ TWL LRG LVL3 (GOWN DISPOSABLE) ×2 IMPLANT
GOWN STRL REUS W/ TWL XL LVL3 (GOWN DISPOSABLE) ×2 IMPLANT
GOWN STRL REUS W/TWL 2XL LVL3 (GOWN DISPOSABLE) ×1 IMPLANT
KIT BASIN OR (CUSTOM PROCEDURE TRAY) ×1 IMPLANT
KIT TURNOVER KIT B (KITS) ×1 IMPLANT
MARKER SKIN DUAL TIP RULER LAB (MISCELLANEOUS) ×1 IMPLANT
NDL HYPO 22X1.5 SAFETY MO (MISCELLANEOUS) ×1 IMPLANT
NDL INSUFFLATION 14GA 120MM (NEEDLE) ×1 IMPLANT
NEEDLE HYPO 22X1.5 SAFETY MO (MISCELLANEOUS) ×1
NEEDLE INSUFFLATION 14GA 120MM (NEEDLE) ×1
OBTURATOR OPTICAL STND 8 DVNC (TROCAR) ×1
OBTURATOR OPTICALSTD 8 DVNC (TROCAR) ×1 IMPLANT
PAD ARMBOARD 7.5X6 YLW CONV (MISCELLANEOUS) ×2 IMPLANT
SCISSORS MNPLR CVD DVNC XI (INSTRUMENTS) ×1 IMPLANT
SEAL UNIV 5-12 XI (MISCELLANEOUS) ×3 IMPLANT
SET TUBE SMOKE EVAC HIGH FLOW (TUBING) ×1 IMPLANT
STOPCOCK 4 WAY LG BORE MALE ST (IV SETS) ×1 IMPLANT
SUT MNCRL AB 4-0 PS2 18 (SUTURE) ×1 IMPLANT
SUT VIC AB 3-0 SH 27X BRD (SUTURE) IMPLANT
SUT VIC AB 3-0 SH 27XBRD (SUTURE) ×1 IMPLANT
SUT VLOC 180 0 6IN GS21 (SUTURE) IMPLANT
TOWEL GREEN STERILE FF (TOWEL DISPOSABLE) ×1 IMPLANT
TRAY LAPAROSCOPIC MC (CUSTOM PROCEDURE TRAY) ×1 IMPLANT

## 2023-06-10 NOTE — Discharge Instructions (Signed)
 After Surgery Home Care Instruction  Activity  The effects of anesthesia are still present and drowsiness may result.  Limit activity for the first 24 hours, then you may return to normal daily activities. Returning to normal daily activities as soon as you can following surgery will enhance recovery time.  Do not drive or operate heavy machinery within 24 hours of taking narcotic pain medications.   Do not mow the lawn, use a vacuum cleaner, or do any other strenuous activities without first consulting your surgical team.   Diet Drink plenty of fluids and eat light meals today, then resume regular diet. Some patients may find their appetite is poor for a week or two after surgery. This is a normal result of the stress of surgery-your appetite will return in time.   There are no specific diet restrictions after surgery.   Dressing and Wound Care  Keep your wound or incision site clean and dry.  You may have different types of dressings covering your incisions depending on your operation and your surgeon: o Dermabond/Durabond (skin glue): This will usually remain in place for 10-14 days, then naturally fall off your skin. You may take a shower 24 hrs after surgery, carefully wash, not scrub the incision site with a mild non-scented soap. Pat dry with a soft towel.  Do not pick or peel skin glue off.   You can shower and let the water fall on the dressings above. Do not soak or submerge your incision(s) in a bath tub, hot tub, or swimming pool, until your doctor says it is ok to do so or the incision(s) have completely healed, usually about 2-4 weeks.  Do not use creams, powder, salves or balms on your incision(s).  What to Expect After Surgery   Moderate discomfort controlled with medications  Minimal drainage from incision  Feeling fatigue and weak  Constipation after surgery is common. Drink plenty fluids and eat a high fiber diet.   Pain Control: Prescribed Non-Narcotic Pain  Medication  You will be given three prescriptions.  Two of them will be for prescription strength ibuprofen (i.e. Advil) and prescription strength acetaminophen (i.e. Tylenol).  The vast majority of patients will just need these two medications.  One prescription will be for a 'rescue' prescription of an oral narcotic (oxycodone).  You may fill this if needed.  You will alternate taking the ibuprofen (600mg ) every 6 hours and also the acetaminophen (650mg ) every 6 hours so that you are taking one of those medications every 3 hours.  For example: o 0800 - take ibuprofen 600mg  o 1100 - take acetaminophen 650mg  o 1400 - take ibuprofen 600mg  o 1700 - take acetaminophen 650mg  o Etc.  Continue taking this alternating pattern of ibuprofen and acetaminophen for 3 days  If you cannot take one or the other of these medications, just take the one you can every 6 hours.  If you are comfortable at night, you Khiyan't have to wake up and take a medication.  If you are still uncomfortable after taking either ibuprofen or acetaminophen, try gentle stretching exercise and ice packs (a bag of frozen vegetables works great).  If you are still uncomfortable, you may fill the narcotic prescription of Oxycodone and take as directed.  Once you have completed these prescriptions, your pain level should be low enough to stop taking medications altogether or just use an over the counter medication (ibuprofen or acetaminophen) as needed.    Pain Control: Over the Counter Medications to take  as needed  Colace/Docusate: May be prescribed by your surgeon to prevent constipation caused by the combination of narcotics, effects of anesthesia, and decreased ambulation.  Hold for loose stools or diarrhea. Take 100 mg 1-2 times a day starting tonight.   Fiber: High fiber foods, extra liquids (water 9-13 cups/day) can also assist with constipation. Examples of high fiber foods are fruit, bran. Prune juice and water are also good liquids  to drink.  Milk of Magnesia/Miralax:  If constipated despite takeing the over the counter stool softeners, you may take Milk of Magnesia or Miralax as directed on bottle to assist with constipation.     Pepcid/Famotidine: May be prescribed while taking naproxen (Aleve) or other NSAIDs such as ibuprofen (Motrin/Advil) to prevent stomach upset or Acid-reflux symptoms. Take 1 tablet 1-2 times a day.   **Constipation: The first bowel movement may occur anywhere between 1-5 days after surgery.  As long as you are not nauseated or not having significant abdominal pain this variation is acceptable. Narcotic pain medications can cause constipation increasing discomfort; early discontinuation will assist with bowel management. If constipated despite taking stool softeners, you may take Milk of Magnesia or Miralax as directed on the bottle.     **Home medications: You may restart your home medications as directed by your respective Primary Care Physician or Surgeon.   When to notify your Doctor or Healthcare Team   Sign of Wound Infection   Fever over 100 degrees.  Wound becomes extremely swollen, shows red streaks, warm to the touch, and/or drainage from the incision site or foul-smelling drainage.  Wound edges separate or opens up  Bleeding or bruising   If you have bleeding, apply pressure to the site and hold the pressure firmly for 5 minutes. If the bleeding continues, apply pressure again and call 911. If the bleeding stopped, call your doctor to report it.   Call your doctor or nurse if you have increased bleeding from your site and increased bruising or a lump forms or gets larger under your skin at the site. Unrelieved Pain   Call your doctor or nurse if your pain gets worse or is not eased 1 hour after taking your pain medicine, or if it is severe and uncontrolled. Nausea and Vomiting   Call your doctor or nurse if you have nausea and vomiting that continues more than 24 hours, will not let you  keep medicine down and will not let you keep fluids down  Fever, Flu-like symptoms   Fever over 100 degrees and/or chills  Gastrointestinal Bleeding Symptoms    Black tarry bowel movements.  This can be normal after surgery on the stomach, but should resolve in a day or two.    Call 911 if you suddenly have signs of blood loss such as:  Vomiting blood  Fast heart rate  Feeling faint, sweaty, or blacking out  Passing bright red blood from your rectum  Blood Clot Symptoms   Tender, swollen or reddened areas in your calf muscle or thighs.  Numbness or tingling in your lower leg or calf, or at the top of your leg or groin  Skin on your leg looks pale or blue or feels cold to touch  Chest pain or have trouble breathing, lightheadedness, fast heart rate  Sudden Onset of Symptoms    Call 911 if you suddenly have:  Leg weakness and spasm  Loss of bladder or bowel function  Seizure  Confusion, severe headache, dizziness or feeling unsteady, problems talking,  difficulty swallowing, and/or numbness or muscle weakness as these could be signs of a stroke.  Follow up Appointment Your follow up appointment should be scheduled 2-3 weeks after your surgery date.  If you have not previously scheduled for a follow-up visit you can be scheduled by contacting [phone].

## 2023-06-10 NOTE — Anesthesia Procedure Notes (Signed)
 Procedure Name: Intubation Date/Time: 06/10/2023 11:32 AM  Performed by: Crisoforo Burnard KIDD, CRNAPre-anesthesia Checklist: Patient identified, Emergency Drugs available, Suction available, Patient being monitored and Timeout performed Patient Re-evaluated:Patient Re-evaluated prior to induction Oxygen Delivery Method: Circle system utilized Preoxygenation: Pre-oxygenation with 100% oxygen Induction Type: IV induction and Rapid sequence Laryngoscope Size: Glidescope and 4 Grade View: Grade I Tube type: Oral Tube size: 7.0 mm Number of attempts: 1 Airway Equipment and Method: Stylet and Video-laryngoscopy Placement Confirmation: ETT inserted through vocal cords under direct vision, positive ETCO2 and breath sounds checked- equal and bilateral Secured at: 22 cm Tube secured with: Tape Dental Injury: Teeth and Oropharynx as per pre-operative assessment

## 2023-06-10 NOTE — Anesthesia Procedure Notes (Signed)
 Anesthesia Regional Block: TAP block   Pre-Anesthetic Checklist: , timeout performed,  Correct Patient, Correct Site, Correct Laterality,  Correct Procedure, Correct Position, site marked,  Risks and benefits discussed,  Surgical consent,  Pre-op evaluation,  At surgeon's request and post-op pain management  Laterality: Left and Right  Prep: Maximum Sterile Barrier Precautions used, chloraprep       Needles:  Injection technique: Single-shot  Needle Type: Echogenic Stimulator Needle     Needle Length: 9cm  Needle Gauge: 22     Additional Needles:   Procedures:,,,, ultrasound used (permanent image in chart),,    Narrative:  Start time: 06/10/2023 10:35 AM End time: 06/10/2023 10:40 AM Injection made incrementally with aspirations every 5 mL.  Performed by: Personally  Anesthesiologist: Merla Almarie HERO, DO  Additional Notes: Monitors applied. No increased pain on injection. No increased resistance to injection. Injection made in 5cc increments. Good needle visualization. Patient tolerated procedure well.

## 2023-06-10 NOTE — H&P (Signed)
 Scott Mcmillan 1961/02/02  [id number].    HPI:  63 y/o M with CT evidence of bilateral inguinal hernias who presents for elective repair.  He reports intermittent pain on both sides of the groin. He denies recent changes in medication and is in his usual state of health.   ROS: Review of Systems  Constitutional: Negative.   HENT: Negative.    Eyes: Negative.   Respiratory: Negative.    Cardiovascular: Negative.   Gastrointestinal: Negative.   Genitourinary: Negative.   Musculoskeletal: Negative.   Skin: Negative.   Neurological: Negative.   Endo/Heme/Allergies: Negative.   Psychiatric/Behavioral: Negative.      Family History  Problem Relation Age of Onset   Heart disease Mother    Lung cancer Father    Colon cancer Neg Hx    Esophageal cancer Neg Hx    Liver cancer Neg Hx    Pancreatic cancer Neg Hx    Rectal cancer Neg Hx    Stomach cancer Neg Hx     Past Medical History:  Diagnosis Date   Bilateral inguinal hernia    per 09-28-2027 ct abdomen with contrast epic   Diabetes (HCC) Type 2    Dysrhythmia    RBBB   GERD (gastroesophageal reflux disease)    Hemorrhoids    History of kidney stones    Hyperlipidemia    Hypertension     Past Surgical History:  Procedure Laterality Date   COLONOSCOPY  05/2017   FISTULOTOMY N/A 12/12/2021   Procedure: SETON PLACEMENT;  Surgeon: Debby Hila, MD;  Location: [facility] [place];  Service: General;  Laterality: N/A;   LIGATION OF INTERNAL FISTULA TRACT N/A 05/22/2022   Procedure: LIGATION OF INTERNAL FISTULA TRACT;  Surgeon: Debby Hila, MD;  Location: [facility] ;  Service: General;  Laterality: N/A;    Social History:  reports that he has never smoked. He has never used smokeless tobacco. He reports that he does not drink alcohol and does not use drugs.  Allergies: No Known Allergies  Medications Prior to Admission  Medication Sig Dispense Refill   amLODipine  (NORVASC ) 10 MG tablet  Take 1 tablet (10 mg total) by mouth daily. 90 tablet 1   carvedilol  (COREG ) 12.5 MG tablet Take 1 tablet (12.5 mg total) by mouth 2 (two) times daily with a meal. 180 tablet 1   Dulaglutide  (TRULICITY ) 0.75 MG/0.5ML SOAJ Inject 0.75 mg into the skin once a week. 2 mL 2   hydrochlorothiazide  (HYDRODIURIL ) 25 MG tablet Take 1 tablet (25 mg total) by mouth daily. 90 tablet 1   Hydrocortisone  (PREPARATION H EX) Apply 1 Application topically daily as needed (hemmorhoids).     lisinopril  (ZESTRIL ) 40 MG tablet Take 1 tablet (40 mg total) by mouth daily. 90 tablet 1   pravastatin  (PRAVACHOL ) 40 MG tablet Take 1 tablet (40 mg total) by mouth daily. 90 tablet 1   Accu-Chek Softclix Lancets lancets Use to check blood sugar 3 times daily. 100 each 6   aspirin  EC 81 MG tablet Take 1 tablet (81 mg total) by mouth daily. 90 tablet 3   Blood Glucose Monitoring Suppl (ACCU-CHEK GUIDE) w/Device KIT Use to check blood sugar 3 times daily. 1 kit 0   Docusate Sodium  (STOOL SOFTENER LAXATIVE PO) Take 100 mg by mouth. Takes 2 to 4 capsules daily as needed.     glucose blood (ACCU-CHEK GUIDE TEST) test strip Use to check blood sugar 3 times daily. 100 each 6  ibuprofen  (ADVIL ) 200 MG tablet Take 200 mg by mouth every 6 (six) hours as needed for moderate pain (pain score 4-6).     oxyCODONE  (OXY IR/ROXICODONE ) 5 MG immediate release tablet Take 1-2 tablets (5-10 mg total) by mouth every 6 (six) hours as needed for severe pain. (Patient not taking: Reported on 04/20/2023) 30 tablet 0    Physical Exam: Blood pressure (!) 146/99, pulse 90, temperature 98 F (36.7 C), temperature source Oral, resp. rate 20, height 5' 7 (1.702 m), weight 136.1 kg, SpO2 93%. Gen: male, NAD Abd: soft, non-distended  Results for orders placed or performed during the hospital encounter of 06/10/23 (from the past 48 hours)  Glucose, capillary     Status: Abnormal   Collection Time: 06/10/23 10:13 AM  Result Value Ref Range    Glucose-Capillary 160 (H) 70 - 99 mg/dL    Comment: Glucose reference range applies only to samples taken after fasting for at least 8 hours.   No results found.  Assessment/Plan 63 y/o M w/ bilateral inguinal hernias  - Will proceed to the OR.  We discussed the alternatives and potential risks of surgery, including but not limited to: bleeding, infection, damage to bowel or surrounding structures, mesh complications, chronic pain, recurrent hernia, and need for additional procedures. All questions were addressed and consent was obtained.    Cordella DELENA Polly Marlis Cheron Surgery 06/10/2023, 10:29 AM Please see Amion for pager number during day hours 7:00am-4:30pm or 7:00am -11:30am on weekends

## 2023-06-10 NOTE — Anesthesia Postprocedure Evaluation (Signed)
 Anesthesia Post Note  Patient: Scott Mcmillan  Procedure(s) Performed: ATTEMPTED XI ROBOTIC ASSISTED BILATERAL INGUINAL HERNIA (Bilateral: Groin)     Patient location during evaluation: PACU Anesthesia Type: Regional and General Level of consciousness: awake and alert, oriented and patient cooperative Pain management: pain level controlled Vital Signs Assessment: post-procedure vital signs reviewed and stable Respiratory status: spontaneous breathing, nonlabored ventilation and respiratory function stable Cardiovascular status: blood pressure returned to baseline and stable Postop Assessment: no apparent nausea or vomiting Anesthetic complications: no   No notable events documented.  Last Vitals:  Vitals:   06/10/23 1400 06/10/23 1415  BP: 103/68 106/80  Pulse: 73 71  Resp: 18 15  Temp:    SpO2: 91% 96%    Last Pain:  Vitals:   06/10/23 1009  TempSrc: Oral  PainSc: 0-No pain                 Almarie CHRISTELLA Marchi

## 2023-06-10 NOTE — Op Note (Signed)
 Scott Mcmillan ([id number])  Operative Report   Date 06/10/2023  PREOPERATIVE DIAGNOSIS: Bilateral Inguinal hernia  POSTOPERATIVE DIAGNOSIS: Same  PROCEDURE:  Diagnostic Laparoscopy Robotic groin exploration  SURGEON: Cordella Idler, MD  ANESTHESIA: General Anesthesia    INTRAOPERATIVE FINDINGS: Large amount of intra-peritoneal fat and within the pre-peritoneal and retrorectus spaces, making identification of anatomy difficult. Patient did not tolerate trendelenburg beyond 15 degrees. Decision was made to abort the planned procedure for safety reasons.   IMPLANTS: None  ESTIMATED BLOOD LOSS: Minimal  COMPLICATIONS: None  SPECIMENS: None  OPERATIVE INDICATIONS: Pt is a 63 y.o. male who had bilateral groin pain that was present on both sides. His symptoms had worsened overtime and based on his history I was concerned for hernias.  There was no bulge present on exam, but he did have a CT from 2019 that indicated bilateral fat-containing inguinal hernias. Given the CT, I offered to proceed to the OR for hernia repair. The procedure's risks, benefits, and alternatives were explained to the patient.  Risks, including the risks of bleeding, infection, need for mesh removal, and potential for hernia recurrence, were discussed.  The patient agreed to proceed and signed informed consent in front of a witness.   DESCRIPTION OF PROCEDURE: After preoperatively identifying the patient in holding, the patient was brought to the operating room and placed supine on the operating room table.  Both arms were tucked and padded to avoid potential nerve injury.  Sequential Compression Devices were placed bilaterally.  After induction of general anesthesia, the patient was given the appropriate perioperative antibiotics.  The abdomen was prepped and draped in a typical sterile fashion.  A JACHO approved time out, where the name of the patient, the operation, and the intended site were confirmed. The abdomen was  accessed with a Veress needle via the standard drop technique in the LUQ at Palmer's point.  Insufflation was established.  An 8 mm optical port was placed under direct visualization in the RLQ.  A camera was introduced into the abdomen, and a thorough inspection of the abdomen was performed to confirm there was no additional pathology.  Two additional 8 mm robotic ports were placed laterally under direct visualization, one superior to the umbilicus and another in the RLQ. The patient was then placed into 15-degree Trendelenburg position to facilitate examination of the bilateral inguinal spaces.  A thorough inspection of the abdomen was undertaken.  There was a large amount of intra-peritoneal fat, including fat along the anterior abdominal wall and bilateral umbilical folds that obscured the view of the groin.      The [employer] robot was brought into the field and docked.  An 8 mm 30-degree scope was placed in the mid-abdominal port.  The peritoneum was taken down 6 cm superior to the area of the internal ring on the right and dissection was taken inferiorly towards the hernia.  I was able to get down to Cooper's medially, but as I attempted to develop the dissection plane to identify the internal ring and important structures, my view was obscured by a large amount of fat.  I decided to enter into the retrorectus space to determine if this would make the dissection possible.  Unfortunately, I encountered the same problem.  I was able to dissect laterally to the abdominal wall and cleared off Cooper's, but attempts at following the epigastric vessels toward the internal ring were obscured by large amounts of fat. We attempted to place him in further trendelenburg position, however, he  did not tolerate this from a hemodynamic standpoint. I decided to attempt dissection on the left side to see if the planes would be more amenable to identify anatomy. I encountered the same problem on the left side. I was able to  completely clear off Cooper's ligament and dissected laterally to the abdominal wall. However, the area in between was obscured by large amounts of fat.  I attempted several more times to bluntly clear the fat from the flap and from the vessels and cord structures, however, I could not safely identify the appropriate anatomy.  Given the difficulty of his anatomy and his inability to tolerate repositioning, I felt that the safest thing to do was to abort the planned hernia repair.  I considered converting to an open procedure, however, he reported pain equally on both sides and I did not want to commit him to bilateral groin wounds without further discussion. The bilateral flaps were closed with a running 3-0 Stratafix barbed suture.  Hemostasis was assured in the entirety of the abdomen.  The two lateral ports were removed under direct visualization.  The abdomen was desufflated under direct visualization.  The port sites were closed, local anesthetic was infiltrated, and Dermabond was applied.   After confirming twice that the sponge, needle, and instrument counts were correct, the procedure was terminated, the patient was extubated and transferred to the recovery room in stable condition.     DISPOSITION: Stable to PACU.   Electronically Signed By: Cordella DELENA Idler 06/10/2023 10:39 AM

## 2023-06-10 NOTE — Transfer of Care (Signed)
 Immediate Anesthesia Transfer of Care Note  Patient: Scott Mcmillan  Procedure(s) Performed: ATTEMPTED XI ROBOTIC ASSISTED BILATERAL INGUINAL HERNIA (Bilateral: Groin)  Patient Location: PACU  Anesthesia Type:MAC and General  Level of Consciousness: awake and alert   Airway & Oxygen Therapy: Patient Spontanous Breathing  Post-op Assessment: Report given to RN  Post vital signs: Reviewed and stable  Last Vitals:  Vitals Value Taken Time  BP 106/80 06/10/23 1415  Temp 36.4 C 06/10/23 1340  Pulse 72 06/10/23 1425  Resp 16 06/10/23 1425  SpO2 89 % 06/10/23 1425  Vitals shown include unfiled device data.  Last Pain:  Vitals:   06/10/23 1009  TempSrc: Oral  PainSc: 0-No pain      Patients Stated Pain Goal: 0 (06/10/23 1009)  Complications: No notable events documented.

## 2023-06-22 ENCOUNTER — Telehealth: Payer: Self-pay

## 2023-06-22 MED ORDER — TRULICITY 0.75 MG/0.5ML ~~LOC~~ SOAJ
0.7500 mg | SUBCUTANEOUS | 0 refills | Status: DC
  Filled 2023-06-22: qty 2, 28d supply, fill #0

## 2023-06-22 NOTE — Telephone Encounter (Signed)
Can patient be scheduled for a video visit?     Copied from CRM [id number]. Topic: Clinical - Medical Advice >> Jun 22, 2023 10:02 AM [name] wrote: Reason for CRM: patient is calling in because he has an appointment with Dr.Johnson on 2/21 regarding his prescription for Dulaglutide (TRULICITY) 0.75 MG/0.5ML SOAJ . Patient said it maybe sleeting/snowing on Wednesday or Thursday. Patients medication runs out on Friday and he may not be able to go to his appointments. Patient is requesting a callback at [phone].

## 2023-06-22 NOTE — Addendum Note (Signed)
Addended by: Jonah Blue B on: 06/22/2023 10:16 PM   Modules accepted: Orders

## 2023-06-22 NOTE — Telephone Encounter (Signed)
1 month refill sent on Trulicity.  He should try to keep his appointment with [place] on Friday.

## 2023-06-23 ENCOUNTER — Other Ambulatory Visit: Payer: Self-pay

## 2023-06-23 ENCOUNTER — Encounter (HOSPITAL_COMMUNITY): Payer: Self-pay | Admitting: Emergency Medicine

## 2023-06-23 ENCOUNTER — Inpatient Hospital Stay (HOSPITAL_COMMUNITY)
Admission: EM | Admit: 2023-06-23 | Discharge: 2023-06-25 | DRG: 175 | Disposition: A | Discharge status: Home / Self Care | Payer: Medicaid Other | Attending: Family Medicine | Admitting: Family Medicine

## 2023-06-23 ENCOUNTER — Emergency Department (HOSPITAL_COMMUNITY): Payer: Medicaid Other

## 2023-06-23 ENCOUNTER — Emergency Department (HOSPITAL_BASED_OUTPATIENT_CLINIC_OR_DEPARTMENT_OTHER): Payer: Medicaid Other

## 2023-06-23 DIAGNOSIS — Z79899 Other long term (current) drug therapy: Secondary | ICD-10-CM

## 2023-06-23 DIAGNOSIS — J9601 Acute respiratory failure with hypoxia: Secondary | ICD-10-CM | POA: Diagnosis present

## 2023-06-23 DIAGNOSIS — Z6841 Body Mass Index (BMI) 40.0 and over, adult: Secondary | ICD-10-CM

## 2023-06-23 DIAGNOSIS — E785 Hyperlipidemia, unspecified: Secondary | ICD-10-CM | POA: Diagnosis present

## 2023-06-23 DIAGNOSIS — M7989 Other specified soft tissue disorders: Secondary | ICD-10-CM

## 2023-06-23 DIAGNOSIS — K219 Gastro-esophageal reflux disease without esophagitis: Secondary | ICD-10-CM | POA: Diagnosis not present

## 2023-06-23 DIAGNOSIS — E669 Obesity, unspecified: Secondary | ICD-10-CM | POA: Diagnosis present

## 2023-06-23 DIAGNOSIS — I10 Essential (primary) hypertension: Secondary | ICD-10-CM | POA: Diagnosis not present

## 2023-06-23 DIAGNOSIS — J189 Pneumonia, unspecified organism: Principal | ICD-10-CM

## 2023-06-23 DIAGNOSIS — E8770 Fluid overload, unspecified: Secondary | ICD-10-CM | POA: Diagnosis present

## 2023-06-23 DIAGNOSIS — J168 Pneumonia due to other specified infectious organisms: Secondary | ICD-10-CM | POA: Diagnosis not present

## 2023-06-23 DIAGNOSIS — E860 Dehydration: Secondary | ICD-10-CM | POA: Diagnosis not present

## 2023-06-23 DIAGNOSIS — I2699 Other pulmonary embolism without acute cor pulmonale: Principal | ICD-10-CM | POA: Diagnosis present

## 2023-06-23 DIAGNOSIS — E119 Type 2 diabetes mellitus without complications: Secondary | ICD-10-CM | POA: Diagnosis not present

## 2023-06-23 DIAGNOSIS — R0902 Hypoxemia: Secondary | ICD-10-CM | POA: Diagnosis not present

## 2023-06-23 DIAGNOSIS — Z7985 Long-term (current) use of injectable non-insulin antidiabetic drugs: Secondary | ICD-10-CM | POA: Diagnosis not present

## 2023-06-23 DIAGNOSIS — N179 Acute kidney failure, unspecified: Secondary | ICD-10-CM | POA: Diagnosis not present

## 2023-06-23 DIAGNOSIS — I2609 Other pulmonary embolism with acute cor pulmonale: Secondary | ICD-10-CM | POA: Diagnosis not present

## 2023-06-23 DIAGNOSIS — Z86711 Personal history of pulmonary embolism: Secondary | ICD-10-CM | POA: Diagnosis not present

## 2023-06-23 DIAGNOSIS — R9389 Abnormal findings on diagnostic imaging of other specified body structures: Secondary | ICD-10-CM | POA: Diagnosis present

## 2023-06-23 DIAGNOSIS — Z7982 Long term (current) use of aspirin: Secondary | ICD-10-CM | POA: Diagnosis not present

## 2023-06-23 LAB — BASIC METABOLIC PANEL
Anion gap: 16 — ABNORMAL HIGH (ref 5–15)
BUN: 17 mg/dL (ref 8–23)
CO2: 21 mmol/L — ABNORMAL LOW (ref 22–32)
Calcium: 9.4 mg/dL (ref 8.9–10.3)
Chloride: 96 mmol/L — ABNORMAL LOW (ref 98–111)
Creatinine, Ser: 1.39 mg/dL — ABNORMAL HIGH (ref 0.61–1.24)
GFR, Estimated: 57 mL/min — ABNORMAL LOW (ref >60)
Glucose, Bld: 159 mg/dL — ABNORMAL HIGH (ref 70–99)
Potassium: 3.5 mmol/L (ref 3.5–5.1)
Sodium: 133 mmol/L — ABNORMAL LOW (ref 135–145)

## 2023-06-23 LAB — I-STAT CG4 LACTIC ACID, ED
Lactic Acid, Venous: 2.1 mmol/L (ref 0.5–1.9)
Lactic Acid, Venous: 2.4 mmol/L (ref 0.5–1.9)
Lactic Acid, Venous: 1.1 mmol/L (ref 0.5–1.9)

## 2023-06-23 LAB — CBC
HCT: 47.6 % (ref 39.0–52.0)
Hemoglobin: 16.3 g/dL (ref 13.0–17.0)
MCH: 30.5 pg (ref 26.0–34.0)
MCHC: 34.2 g/dL (ref 30.0–36.0)
MCV: 89.1 fL (ref 80.0–100.0)
Platelets: 213 10*3/uL (ref 150–400)
RBC: 5.34 MIL/uL (ref 4.22–5.81)
RDW: 12.7 % (ref 11.5–15.5)
WBC: 17 10*3/uL — ABNORMAL HIGH (ref 4.0–10.5)
nRBC: 0 % (ref 0.0–0.2)

## 2023-06-23 LAB — RESP PANEL BY RT-PCR (RSV, FLU A&B, COVID)  RVPGX2
Influenza A by PCR: NEGATIVE
Influenza B by PCR: NEGATIVE
Resp Syncytial Virus by PCR: NEGATIVE
SARS Coronavirus 2 by RT PCR: NEGATIVE

## 2023-06-23 LAB — TROPONIN I (HIGH SENSITIVITY)
Troponin I (High Sensitivity): 41 ng/L — ABNORMAL HIGH (ref <18)
Troponin I (High Sensitivity): 38 ng/L — ABNORMAL HIGH (ref <18)

## 2023-06-23 LAB — BRAIN NATRIURETIC PEPTIDE: B Natriuretic Peptide: 119.8 pg/mL — ABNORMAL HIGH (ref 0.0–100.0)

## 2023-06-23 LAB — HIV ANTIBODY (ROUTINE TESTING W REFLEX): HIV Screen 4th Generation wRfx: NONREACTIVE

## 2023-06-23 MED ORDER — PRAVASTATIN SODIUM 40 MG PO TABS
40.0000 mg | ORAL_TABLET | Freq: Every day | ORAL | Status: DC
  Administered 2023-06-23 – 2023-06-25 (×3): 40 mg via ORAL
  Filled 2023-06-23 (×3): qty 1

## 2023-06-23 MED ORDER — CARVEDILOL 12.5 MG PO TABS
12.5000 mg | ORAL_TABLET | Freq: Two times a day (BID) | ORAL | Status: DC
  Administered 2023-06-24 – 2023-06-25 (×3): 12.5 mg via ORAL
  Filled 2023-06-23 (×3): qty 1

## 2023-06-23 MED ORDER — SODIUM CHLORIDE 0.9 % IV SOLN
500.0000 mg | Freq: Once | INTRAVENOUS | Status: AC
  Administered 2023-06-23: 500 mg via INTRAVENOUS
  Filled 2023-06-23: qty 5

## 2023-06-23 MED ORDER — HEPARIN BOLUS VIA INFUSION
6000.0000 [IU] | Freq: Once | INTRAVENOUS | Status: AC
  Administered 2023-06-23: 6000 [IU] via INTRAVENOUS
  Filled 2023-06-23: qty 6000

## 2023-06-23 MED ORDER — LACTATED RINGERS IV SOLN: INTRAVENOUS | Status: DC

## 2023-06-23 MED ORDER — INSULIN ASPART 100 UNIT/ML IJ SOLN
0.0000 [IU] | Freq: Three times a day (TID) | INTRAMUSCULAR | Status: DC
  Administered 2023-06-24 (×2): 2 [IU] via SUBCUTANEOUS
  Administered 2023-06-25: 5 [IU] via SUBCUTANEOUS

## 2023-06-23 MED ORDER — SODIUM CHLORIDE 0.9 % IV SOLN
1.0000 g | Freq: Once | INTRAVENOUS | Status: AC
  Administered 2023-06-23: 1 g via INTRAVENOUS
  Filled 2023-06-23: qty 10

## 2023-06-23 MED ORDER — IOHEXOL 350 MG/ML SOLN
75.0000 mL | Freq: Once | INTRAVENOUS | Status: AC | PRN
  Administered 2023-06-23: 75 mL via INTRAVENOUS

## 2023-06-23 MED ORDER — SODIUM CHLORIDE 0.9 % IV BOLUS
1000.0000 mL | Freq: Once | INTRAVENOUS | Status: AC
  Administered 2023-06-23: 1000 mL via INTRAVENOUS

## 2023-06-23 MED ORDER — HEPARIN (PORCINE) 25000 UT/250ML-% IV SOLN
1800.0000 [IU]/h | INTRAVENOUS | Status: DC
  Administered 2023-06-23: 1600 [IU]/h via INTRAVENOUS
  Administered 2023-06-24: 1800 [IU]/h via INTRAVENOUS
  Filled 2023-06-23 (×2): qty 250

## 2023-06-23 MED ORDER — POLYETHYLENE GLYCOL 3350 17 G PO PACK: 17.0000 g | PACK | Freq: Every day | ORAL | Status: DC | PRN

## 2023-06-23 NOTE — Assessment & Plan Note (Addendum)
Pts chest x-ray with bilateral opacities. Given azithromycin and ceftriaxone x1 once in the ED. Respiratory Panel Negative and CBC w/o leukocytosis. Vitals stable and patient denying upper respiratory symptoms. Patient asymptomatic for infectious process currently. Will hold off on re-dosing antibiotics.   - consider re-dosing antibiotics if clinically worsens  - CTM  - AM CBC  - Blood cultures

## 2023-06-23 NOTE — H&P (Cosign Needed Addendum)
Hospital Admission History and Physical Service Pager: [phone]  Patient name: Scott Mcmillan Medical record number: [id number] Date of Birth: 06/25/1960 Age: 63 y.o. Gender: male  Primary Care Provider: Marcine Matar, MD Consultants: CCM in ED  Code Status: FULL  Preferred Emergency Contact:   Contact Information     Name Relation Home Work Mobile   [place] Brother   [phone]   Ed[place]erto, Sens   [phone]      Other Contacts   None on File    Chief Complaint: SOB, fast heart rate  Assessment and Plan: Scott Mcmillan is a 63 y.o. male presenting with SOB and tachycardia. Differential for presentation of this includes:   Pulmonary embolism: Patient with tachycardia and shortness of breath worsened with exertion Sunday. Patient recently underwent surgery 2 weeks ago and sedentary due to post op pain. Patient sating comfortably on 3L oxygen in ED, intermittent tachy 104, CT notable with bilateral PE, with some right heart strain.  Pneumonia: Less suspicious, patient with increased oxygen requirement, however sating comfortably and non-labored breathing. Denies any ongoing respiratory symptoms. Afebrile. CT and CXR with bilateral opacities suspicious for pneumonia. Respiratory Panel negative, CBC w/o leukocytosis, s/p ceftriaxone and azithromycin x1.   Pneumothorax: Less suspicious, patient sating comfortably on 3L, denies any pain specifically with breathing.  No evidence of pneumothorax on chest imaging and presence of lung sounds on exam. CHF: Less suspicious, Pt has chronic lower extremity edema 1-2+ on physical exam. Otherwise, no signs of fluid overload (no crackles). BNP mildly elevated at 119.8.   Assessment & Plan Pulmonary embolism [facility]) Patient presented with tachycardia and shortness of breath worsening with exertion.  CT imaging notable with bilateral PEs and suggestive right heart strain. RLE DVT US negative. Patient reports prior surgery 2 weeks  ago and sedentary lifestyle postop. Pts PESI class III, intermediate risk. CCM evaluated images in ED. Determined no need for critical care at the moment. Pt with non-laborous breathing throughout interview. Vitals stable. Pharmacy dosing Heparin for resolution of clots.   - Admit to FMTS, Dr. Lum Babe  - ECHO, await results before initiating IV fluids given evidence of right heart strain - RLE DVT US, previously negative left lower extremity DVT ultrasound - Heparin per pharmacy  - Vitals per floor  - Continue Telemetry  - Continuous Pulse Ox - PT/OT  - Low threshold to transfer to ICU if worsening hemodynamic status in the setting of right heart strain Leg swelling Pt with chronic bilateral lower extremity edema, L > R 2+. L DVT US negative. Mildly elevated Trops 41 >> 38 down trending. EKG unremarkable. Patient appears mildly hypervolemic on exam. No crackles or appreciate JVD on exam.  Can consider switching off amlodipine in the future for hypertension management if concern for worsening swelling. - RLE DVT US  - CTM  Type 2 diabetes mellitus without complications (HCC) Pt with prior history, most recent A1c 6.7 (12/19/2022)Home meds: Trulicity once weekly - A1c    - CBG  - mSSI given insulin naivety  Abnormal chest x-ray Pts chest x-ray with bilateral opacities. Given azithromycin and ceftriaxone x1 once in the ED. Respiratory Panel Negative and CBC w/o leukocytosis. Vitals stable and patient denying upper respiratory symptoms. Patient asymptomatic for infectious process currently. Will hold off on re-dosing antibiotics.   - consider re-dosing antibiotics if clinically worsens  - CTM  - AM CBC  - Blood cultures  Acute kidney injury (HCC) Pt with increased Cr 1.39 on admission, baseline appears  to be 0.7-1.18. Given 1L NS bolus in the ED. Tolerating PO without difficulty.  - Encourage PO intake  - Consider bolus or maintenance fluids, pending echo results  - AM BMP  - Avoid  nephrotoxic agents   Chronic and Stable Problems:  HTN: ContinueHome Carvedilol, holding other home medications due to soft BPS HLD: Continue Home Pravastatin  FEN/GI: Heart/ Carb modified diet  VTE Prophylaxis: Heparin   Disposition: Progressive  History of Present Illness:  Scott Mcmillan is a 63 y.o. male presenting with shortness of breath 4-5 days ago.   SOB started on Sunday, and continued throughout the day and Monday. He also had some pain in the lower right lung/ribs. No real chest pain for him. He also checked his BP and heart rate at home and noted that the numbers were high. The heart rate also seemed to get fast and go slow which worried him. The SOB was mostly with exertion when going to the couch or the bathroom.  He has never had any blood clots before. No family history. Never has been on blood thinners. Never had a stroke or heart attack. He did recently have bilateral inguinal hernia surgery on 06/10/2023, though they were unsuccessful at full repair given difficulty visualizing the hernias well. No recent travel because he has been not moving as much due to the postop pain. The pain is mostly around the incision sites, and it feels like a tugging pain.  He has had no sick contacts. No fevers or chills. No coughing, sneezing, n/v, constipation, or diarrhea. He did have diarrhea with metformin, but he is now on trulicity. He checks his sugars every day in the mornings, and they have been around the 120-170s recently.  In the ED, started on cap coverage Azithromycin and Ceftriaxone. 1 L NS bolus. Heparin per pharmacy. CCM evaluated and determined no need for ICU. Satting well on [place].  Mildly elevated trops 41 >> 38 WBC 17 LA 2.1 >> 1.1 Cr 1.39, Na 133 BNP 119.8 Respiratory Panel negative  Blood culture pending  CT PE w/ bilateral PE w/ R heart strain   Review Of Systems: Per HPI.  Pertinent Past Medical History: HTN  HLD Obesity  Type 2 DM  Remainder reviewed in  history tab.   Pertinent Past Surgical History: Colonoscopy 2019  Fistulotomy 2023 Ligation of Internal fistula tract  2024 Remainder reviewed in history tab.   Pertinent Social History: Tobacco use: No Alcohol use: Socially, once yearly Other Substance use: No Lives with himself  Pertinent Family History: Mother: Heart Disease  Father: Lung Cancer Younger brother: DM, heart disease, afib Older brother: Heart disease, pacemaker dependent Remainder reviewed in history tab.   Important Outpatient Medications: Amlodipine 10 mg daily  Carvedilol 12.5 mg BID  Hydrochlorothiazide 25 mg daily Lisinopril 40 mg daily  ASA 81 mg daily  Pravastatin 40 mg daily  Dulaglutide 0.75 mg once weekly Remainder reviewed in medication history.   Objective: BP 105/74   Pulse 86   Temp 98.2 F (36.8 C) (Oral)   Resp (!) 21   Ht 5\' 7"  (1.702 m)   Wt 136.1 kg   SpO2 92%   BMI 46.99 kg/m  Exam: General: Sitting up in chair in room, NAD, conversant Eyes: Non scleral icterus  Neck: Normal range of motion  Cardiovascular: Regular rate, no murmurs appreciated Respiratory: NWOB, CTAB in posterior lung fields, no wheezes, crackles or ronchi noted  Gastrointestinal: Normoactive bowel sounds, soft, non tender to palpation  MSK:  Full ROM  EXT: Bilateral lower extremity edema 2+, L > R chronically per patient  Neuro: Alert and Oriented to person, place and situation  Psych: Pleasant and appropriate affect   Labs:  CBC BMET  Recent Labs  Lab 06/23/23 1204  WBC 17.0*  HGB 16.3  HCT 47.6  PLT 213   Recent Labs  Lab 06/23/23 1204  NA 133*  K 3.5  CL 96*  CO2 21*  BUN 17  CREATININE 1.39*  GLUCOSE 159*  CALCIUM 9.4    Pertinent additional labs: Mildly elevated trops 41 >> 38 LA 2.1 >> 1.1 BNP 119.8 Respiratory Panel negative  .  EKG:  NSR, RBB , no ST elevations noted   Imaging Studies Performed:  CXR:  Impression:  Patchy right greater than left lower lung airspace  disease suspicious for pneumonia. Radiographic follow-up to resolution is recommended.  CT PE:  Impression:  1. Positive for acute bilateral pulmonary embol with CT evidence of right heart strain (RV/LV Ratio = 1.4) consistent with at least submassive (intermediate risk) PE. The presence of right heart strain has been associated with an increased risk of morbidity and mortality. Please refer to the "Code PE Focused" order set in EPIC. 2. Heterogeneous consolidation and ground-glass density in the right lower and middle lobes, with mild ground-glass density in the left upper lobe, suspect for pneumonia. Difficult to ex  Peterson Ao, MD 06/23/2023, 7:51 PM PGY-1, [facility] Medicine  FPTS Intern pager: [phone], text pages welcome Secure chat group [facility] Teaching Service   I agree with the assessment and plan as documented above.  Janeal Holmes, MD PGY-2, [facility] Medicine

## 2023-06-23 NOTE — Assessment & Plan Note (Addendum)
Pt with chronic bilateral lower extremity edema, L > R 2+. L DVT US negative. Mildly elevated Trops 41 >> 38 down trending. EKG unremarkable. Patient appears mildly hypervolemic on exam. No crackles or appreciate JVD on exam.  Can consider switching off amlodipine in the future for hypertension management if concern for worsening swelling. - RLE DVT US  - CTM

## 2023-06-23 NOTE — Progress Notes (Signed)
VASCULAR LAB    Left lower extremity venous duplex has been performed.  See CV proc for preliminary results.   Lucah Petta, RVT 06/23/2023, 1:19 PM

## 2023-06-23 NOTE — ED Provider Triage Note (Cosign Needed Addendum)
Emergency Medicine Provider Triage Evaluation Note  Scott Mcmillan , a 63 y.o. male  was evaluated in triage.  Pt complains of shortness of breath.  Started last night while walking.  Denies associated chest pain.  Had a heart monitor at home and noticed his heart rate was fluctuating from fast to slow.  His mother had A-fib he thought it might be that.  Also reports some swelling in the left leg that he states has been there 10 to 15 years.  Believes it to be an "arterial problem" per his medical provider.  Does not have an oxygen requirement at home.  Review of Systems  Positive: See above Negative: See above  Physical Exam  BP (!) 140/101 (BP Location: Right Arm)   Pulse (!) 107   Temp 98.6 F (37 C)   Resp 16   Ht 5\' 7"  (1.702 m)   Wt 136.1 kg   SpO2 93%   BMI 46.99 kg/m  Gen:   Awake, no distress   Resp:  Normal effort  MSK:   Moves extremities without difficulty  Other:    Medical Decision Making  Medically screening exam initiated at 12:09 PM.  Appropriate orders placed.  Scott Mcmillan was informed that the remainder of the evaluation will be completed by another provider, this initial triage assessment does not replace that evaluation, and the importance of remaining in the ED until their evaluation is complete.  Work up started   Gareth Eagle, PA-C 06/23/23 1210    Gareth Eagle, PA-C 06/23/23 [phone]

## 2023-06-23 NOTE — Hospital Course (Signed)
Scott Mcmillan is a 63 y.o. male who was admitted to the [facility] Medicine Teaching Service at [facility] for SOB and found to have bilateral submassive PE, PNA, AKI. Hospital course is outlined below by problem.   Bilateral submassive PE ***  CAP Presented with associated findings on CTA.  AKI Cr on admission 1.39 up from baseline around 1.  Other conditions that were chronic and stable: ***  Issues for follow up: ***

## 2023-06-23 NOTE — Progress Notes (Addendum)
ANTICOAGULATION CONSULT NOTE  Pharmacy Consult for Heparin Indication: pulmonary embolus  No Known Allergies  Patient Measurements: Height: 5\' 7"  (170.2 cm) Weight: 136.1 kg (300 lb) IBW/kg (Calculated) : 66.1 Heparin Dosing Weight: 98.7 kg  Vital Signs: Temp: 98.2 F (36.8 C) (02/18 1807) Temp Source: Oral (02/18 1807) BP: 105/74 (02/18 1700) Pulse Rate: 86 (02/18 1700)  Labs: Recent Labs    06/23/23 1204 06/23/23 1456  HGB 16.3  --   HCT 47.6  --   PLT 213  --   CREATININE 1.39*  --   TROPONINIHS 41* 38*    Estimated Creatinine Clearance: 71.5 mL/min (A) (by C-G formula based on SCr of 1.39 mg/dL (H)).   Medical History: Past Medical History:  Diagnosis Date   Bilateral inguinal hernia    per 09-28-2027 ct abdomen with contrast epic   Diabetes (HCC) Type 2    Dysrhythmia    RBBB   GERD (gastroesophageal reflux disease)    Hemorrhoids    History of kidney stones    Hyperlipidemia    Hypertension     Medications:  (Not in a hospital admission)  Scheduled:  Infusions:  PRN:   Assessment: 38 yom with a history of Bilateral inguinal hernia s/p repair 06/10/23, T2DM, GERD, HLD, HTN. Patient is presenting with SOB. Heparin per pharmacy consult placed for pulmonary embolus.  CTA w/ bilateral PE w/ evidence of RHS  Patient is not on anticoagulation prior to arrival.  Hgb 16.3; plt 213  Goal of Therapy:  Heparin level 0.3-0.7 units/ml Monitor platelets by anticoagulation protocol: Yes   Plan:  Give IV heparin 6000 units bolus x 1 Start heparin infusion at 1600 units/hr Check anti-Xa level at 0500 and daily while on heparin Continue to monitor H&H and platelets  Delmar Landau, PharmD, BCPS 06/23/2023 7:42 PM ED Clinical Pharmacist -  [phone]

## 2023-06-23 NOTE — Telephone Encounter (Signed)
Called & spoke to the patient. Verified name & DOB. Informed that the requested refill has been sent to the pharmacy. Patient expressed verbal understanding.

## 2023-06-23 NOTE — Assessment & Plan Note (Addendum)
Pt with increased Cr 1.39 on admission, baseline appears to be 0.7-1.18. Given 1L NS bolus in the ED. Tolerating PO without difficulty.  - Encourage PO intake  - Consider bolus or maintenance fluids, pending echo results  - AM BMP  - Avoid nephrotoxic agents

## 2023-06-23 NOTE — H&P (Signed)
FMTS ATTENDING ADMISSION NOTE Scott Hauser,MD I  have seen and examined this patient, reviewed their chart. I have discussed this patient with the resident. I agree with the resident's findings, assessment and care plan.   63 Y/O M with PMHx of DM2, dysrhythmias, GERD, HLD, and HTN presents with hx of SOB on exertion, which started about 2 days ago. He denies coughing. However, he mentioned sinus drainage 2 days ago, which he coughed out sporadically. No fever, no wheezing, no sick contact. The patient endorsed the use of a pillow to sleep at night for many years for confort but does not specifically describe orthopnea or PND. He denies any chest pain. He feels better with his breathing at the time of the exam.   Exam: Gen: Calm seated upright. No distress HEENT: EOMI, PERRLA, no JV distention Heart: S1 S2 normal, no murmurs. RRR Lungs: Air entry equal and CTA B/L Abd: Obese, soft, NT, normoactive BS Ext: Dry, scaly skin, fat leg with no pitting edema. No calf swelling or tenderness.   A/P: Acute Hypoxic respiratory failure: Now O2 requirement, now on 3 L O2 Secondary to submassive acute pulmonary embolism Chest x-ray suggestive of PNA - less likely given no cough or fever CTA chest shows submassive PE with suspicion for underlying PNA - Again, no symptoms of PNA S/P IV A/B in the ED - may hold for now unless if symptoms worsen with cough and fever Respiratory viral panel negative Other lab reports reviewed Duplex US of his B/L LL negative for PE Obtain ECHO to assess his LVEF status Continue O2 supplementation and wean as tolerated Heparin drip was initiated with the goal of transitioning to DOAC Monitor his hemodynamic status closely with a low threshold to transition to CCM if he becomes unstable  Acute renal impairment: Likely prerenal from dehydration Gap mildly elevated He is otherwise well-appearing Oral hydration encouraged Monitor I/Os closely Avoid nephrotoxic  agent Repeat Bmet tomorrow IVF if function worsens  For other chronic problems, review the home regimen and resume when appropriate.

## 2023-06-23 NOTE — ED Provider Notes (Signed)
63 yo male presenting with shortness of breath Xray concerning for PNA - given Rocephin and azithromycin Hypoxic requiring 2L [place] WBC 17.0 BNP 119 Covid/flu negative  Pending CT PE study  Physical Exam  BP 103/70   Pulse 94   Temp 97.9 F (36.6 C)   Resp (!) 24   Ht 5\' 7"  (1.702 m)   Wt 136.1 kg   SpO2 92%   BMI 46.99 kg/m   Physical Exam  Procedures  .Critical Care  Performed by: Terald Sleeper, MD Authorized by: Terald Sleeper, MD   Critical care provider statement:    Critical care time (minutes):  30   Critical care time was exclusive of:  Separately billable procedures and treating other patients   Critical care was necessary to treat or prevent imminent or life-threatening deterioration of the following conditions:  Respiratory failure and circulatory failure   Critical care was time spent personally by me on the following activities:  Ordering and performing treatments and interventions, ordering and review of laboratory studies, ordering and review of radiographic studies, pulse oximetry, review of old charts, examination of patient and evaluation of patient's response to treatment   Care discussed with: admitting provider   Comments:     IV heparin for PE   ED Course / MDM   Clinical Course as of 06/23/23 2051  Tue Jun 23, 2023  1925 Dr Sallye Lat Critical care -reviewed the patient's workup and CT imaging, okay with progressive bed admission, not requiring emergent intervention at this time. [MT]  1952 Admitted to [facility] medicine service [MT]    Clinical Course User Index [MT] Terald Sleeper, MD   Medical Decision Making Amount and/or Complexity of Data Reviewed Labs: ordered. Radiology: ordered.  Risk Prescription drug management. Decision regarding hospitalization.   CT pe study reviewed, concerning for large PE with right heart strain, likely PNA.  Pt reassessed comfortable on 3L [place] at 92% sitting, BP stable, no labored breathing.  I have  ordered IV heparin as anticoagulation.  I will discuss the case with critical care but I am anticipating stable for hospitalist admission.  Patient is ready received antibiotics.      Terald Sleeper, MD 06/23/23 2051

## 2023-06-23 NOTE — Assessment & Plan Note (Addendum)
Pt with prior history, most recent A1c 6.7 (12/19/2022)Home meds: Trulicity once weekly - A1c    - CBG  - mSSI given insulin naivety

## 2023-06-23 NOTE — ED Provider Notes (Signed)
[place] EMERGENCY DEPARTMENT AT [facility] Provider Note   CSN: [id number] Arrival date & time: 06/23/23  1142     History {Add pertinent medical, surgical, social history, OB history to HPI:1} Chief Complaint  Patient presents with   Shortness of Breath    Scott Mcmillan is a 63 y.o. male.   Shortness of Breath      Home Medications Prior to Admission medications   Medication Sig Start Date End Date Taking? Authorizing Provider  Accu-Chek Softclix Lancets lancets Use to check blood sugar 3 times daily. 04/21/23   Marcine Matar, MD  amLODipine (NORVASC) 10 MG tablet Take 1 tablet (10 mg total) by mouth daily. 02/05/23   Marcine Matar, MD  aspirin EC 81 MG tablet Take 1 tablet (81 mg total) by mouth daily. 01/02/17   Lizbeth Bark, FNP  Blood Glucose Monitoring Suppl (ACCU-CHEK GUIDE) w/Device KIT Use to check blood sugar 3 times daily. 04/21/23   Marcine Matar, MD  carvedilol (COREG) 12.5 MG tablet Take 1 tablet (12.5 mg total) by mouth 2 (two) times daily with a meal. 12/19/22   Marcine Matar, MD  Docusate Sodium (STOOL SOFTENER LAXATIVE PO) Take 100 mg by mouth. Takes 2 to 4 capsules daily as needed.    [provider]  Dulaglutide (TRULICITY) 0.75 MG/0.5ML SOAJ Inject 0.75 mg into the skin once a week. 06/22/23   Marcine Matar, MD  glucose blood (ACCU-CHEK GUIDE TEST) test strip Use to check blood sugar 3 times daily. 04/21/23   Marcine Matar, MD  hydrochlorothiazide (HYDRODIURIL) 25 MG tablet Take 1 tablet (25 mg total) by mouth daily. 12/19/22   Marcine Matar, MD  Hydrocortisone (PREPARATION H EX) Apply 1 Application topically daily as needed (hemmorhoids).    [provider]  ibuprofen (ADVIL) 200 MG tablet Take 200 mg by mouth every 6 (six) hours as needed for moderate pain (pain score 4-6).    [provider]  lisinopril (ZESTRIL) 40 MG tablet Take 1 tablet (40 mg total) by mouth daily. 02/05/23    Marcine Matar, MD  pravastatin (PRAVACHOL) 40 MG tablet Take 1 tablet (40 mg total) by mouth daily. 02/05/23   Marcine Matar, MD      Allergies    Patient has no known allergies.    Review of Systems   Review of Systems  Respiratory:  Positive for shortness of breath.     Physical Exam Updated Vital Signs BP (!) 140/101 (BP Location: Right Arm)   Pulse (!) 107   Temp 98.6 F (37 C)   Resp 16   Ht 5\' 7"  (1.702 m)   Wt 136.1 kg   SpO2 93%   BMI 46.99 kg/m  Physical Exam  ED Results / Procedures / Treatments   Labs (all labs ordered are listed, but only abnormal results are displayed) Labs Reviewed  BASIC METABOLIC PANEL - Abnormal; Notable for the following components:      Result Value   Sodium 133 (*)    Chloride 96 (*)    CO2 21 (*)    Glucose, Bld 159 (*)    Creatinine, Ser 1.39 (*)    GFR, Estimated 57 (*)    Anion gap 16 (*)    All other components within normal limits  CBC - Abnormal; Notable for the following components:   WBC 17.0 (*)    All other components within normal limits  BRAIN NATRIURETIC PEPTIDE - Abnormal; Notable  for the following components:   B Natriuretic Peptide 119.8 (*)    All other components within normal limits  TROPONIN I (HIGH SENSITIVITY) - Abnormal; Notable for the following components:   Troponin I (High Sensitivity) 41 (*)    All other components within normal limits    EKG None  Radiology VAS [place] LOWER EXTREMITY VENOUS (DVT) (7a-7p) Result Date: 06/23/2023  Lower Venous DVT Study Patient Name:  Scott Mcmillan  Date of Exam:   06/23/2023 Medical Rec #: [id number]      Accession #:    [id number] Date of Birth: 1960/11/22      Patient Gender: M Patient Age:   59 years Exam Location:  [facility] Procedure:      VAS [place] LOWER EXTREMITY VENOUS (DVT) Referring Phys: Riki Sheer --------------------------------------------------------------------------------  Indications: Left lower extremity edema X 14 years. Patient  states he thinks he remembers being diagnosed with lymphedema. Other Indications: New shortness of breath with activity. Comparison Study: No prior study on file Performing Technologist: Sherren Kerns RVS  Examination Guidelines: A complete evaluation includes B-mode imaging, spectral Doppler, color Doppler, and power Doppler as needed of all accessible portions of each vessel. Bilateral testing is considered an integral part of a complete examination. Limited examinations for reoccurring indications may be performed as noted. The reflux portion of the exam is performed with the patient in reverse Trendelenburg.  +-----+---------------+---------+-----------+----------+--------------+ RIGHTCompressibilityPhasicitySpontaneityPropertiesThrombus Aging +-----+---------------+---------+-----------+----------+--------------+ CFV  Full           Yes      Yes                                 +-----+---------------+---------+-----------+----------+--------------+   +---------+---------------+---------+-----------+----------+--------------+ LEFT     CompressibilityPhasicitySpontaneityPropertiesThrombus Aging +---------+---------------+---------+-----------+----------+--------------+ CFV      Full           Yes      Yes                                 +---------+---------------+---------+-----------+----------+--------------+ SFJ      Full                                                        +---------+---------------+---------+-----------+----------+--------------+ FV Prox  Full                                                        +---------+---------------+---------+-----------+----------+--------------+ FV Mid   Full                                                        +---------+---------------+---------+-----------+----------+--------------+ FV DistalFull           Yes      Yes                                  +---------+---------------+---------+-----------+----------+--------------+  PFV      Full                                                        +---------+---------------+---------+-----------+----------+--------------+ POP      Full           No       Yes                                 +---------+---------------+---------+-----------+----------+--------------+ PTV      Full                                                        +---------+---------------+---------+-----------+----------+--------------+ PERO     Full                                                        +---------+---------------+---------+-----------+----------+--------------+ Gastroc  Full                                                        +---------+---------------+---------+-----------+----------+--------------+    Summary: RIGHT: - No evidence of common femoral vein obstruction.   LEFT: - There is no evidence of deep vein thrombosis in the lower extremity.  - No cystic structure found in the popliteal fossa.  *See table(s) above for measurements and observations.    Preliminary     Procedures Procedures  {Document cardiac monitor, telemetry assessment procedure when appropriate:1}  Medications Ordered in ED Medications - No data to display  ED Course/ Medical Decision Making/ A&P   {   Click here for ABCD2, HEART and other calculatorsREFRESH Note before signing :1}                              Medical Decision Making Amount and/or Complexity of Data Reviewed Labs: ordered. Radiology: ordered.   ***  {Document critical care time when appropriate:1} {Document review of labs and clinical decision tools ie heart score, Chads2Vasc2 etc:1}  {Document your independent review of radiology images, and any outside records:1} {Document your discussion with family members, caretakers, and with consultants:1} {Document social determinants of health affecting pt's care:1} {Document your  decision making why or why not admission, treatments were needed:1} Final Clinical Impression(s) / ED Diagnoses Final diagnoses:  None    Rx / DC Orders ED Discharge Orders     None

## 2023-06-23 NOTE — Assessment & Plan Note (Addendum)
Patient presented with tachycardia and shortness of breath worsening with exertion.  CT imaging notable with bilateral PEs and suggestive right heart strain. RLE DVT US negative. Patient reports prior surgery 2 weeks ago and sedentary lifestyle postop. Pts PESI class III, intermediate risk. CCM evaluated images in ED. Determined no need for critical care at the moment. Pt with non-laborous breathing throughout interview. Vitals stable. Pharmacy dosing Heparin for resolution of clots.   - Admit to FMTS, Dr. Lum Babe  - ECHO, await results before initiating IV fluids given evidence of right heart strain - RLE DVT US, previously negative left lower extremity DVT ultrasound - Heparin per pharmacy  - Vitals per floor  - Continue Telemetry  - Continuous Pulse Ox - PT/OT  - Low threshold to transfer to ICU if worsening hemodynamic status in the setting of right heart strain

## 2023-06-23 NOTE — ED Notes (Signed)
Ran 1st lactic acid sample twice; 1st result was 2.10, 2nd result on same sample was 2.37(ran 17 minutes after first result) Test will be credited 06/24/23.

## 2023-06-23 NOTE — ED Triage Notes (Signed)
Pt complains of SOB with walking that started last night. Pt denies any cough, chest pain or fever. Pt states noticed some phlegm in throat on Sunday. Pain to right rib no injury. Hernia surgery 2 weeks .

## 2023-06-24 ENCOUNTER — Inpatient Hospital Stay (HOSPITAL_COMMUNITY): Payer: Medicaid Other

## 2023-06-24 ENCOUNTER — Telehealth (HOSPITAL_COMMUNITY): Payer: Self-pay | Admitting: Pharmacy Technician

## 2023-06-24 ENCOUNTER — Other Ambulatory Visit (HOSPITAL_COMMUNITY): Payer: Self-pay

## 2023-06-24 DIAGNOSIS — R0902 Hypoxemia: Secondary | ICD-10-CM | POA: Diagnosis not present

## 2023-06-24 DIAGNOSIS — Z86711 Personal history of pulmonary embolism: Secondary | ICD-10-CM

## 2023-06-24 DIAGNOSIS — E119 Type 2 diabetes mellitus without complications: Secondary | ICD-10-CM

## 2023-06-24 DIAGNOSIS — N179 Acute kidney failure, unspecified: Secondary | ICD-10-CM | POA: Diagnosis not present

## 2023-06-24 DIAGNOSIS — I2699 Other pulmonary embolism without acute cor pulmonale: Secondary | ICD-10-CM | POA: Diagnosis not present

## 2023-06-24 DIAGNOSIS — I2609 Other pulmonary embolism with acute cor pulmonale: Secondary | ICD-10-CM | POA: Diagnosis not present

## 2023-06-24 LAB — CBC
HCT: 43.8 % (ref 39.0–52.0)
Hemoglobin: 14.8 g/dL (ref 13.0–17.0)
MCH: 31 pg (ref 26.0–34.0)
MCHC: 33.8 g/dL (ref 30.0–36.0)
MCV: 91.6 fL (ref 80.0–100.0)
Platelets: 189 10*3/uL (ref 150–400)
RBC: 4.78 MIL/uL (ref 4.22–5.81)
RDW: 12.8 % (ref 11.5–15.5)
WBC: 12 10*3/uL — ABNORMAL HIGH (ref 4.0–10.5)
nRBC: 0 % (ref 0.0–0.2)

## 2023-06-24 LAB — ECHOCARDIOGRAM COMPLETE
AR max vel: 2.62 cm2
AV Area VTI: 2.73 cm2
AV Area mean vel: 2.79 cm2
AV Mean grad: 4 mm[Hg]
AV Peak grad: 7 mm[Hg]
Ao pk vel: 1.32 m/s
Area-P 1/2: 2.93 cm2
Height: 67 in
S' Lateral: 4 cm
Weight: 4691.2 [oz_av]

## 2023-06-24 LAB — BASIC METABOLIC PANEL
Anion gap: 14 (ref 5–15)
BUN: 19 mg/dL (ref 8–23)
CO2: 22 mmol/L (ref 22–32)
Calcium: 8.8 mg/dL — ABNORMAL LOW (ref 8.9–10.3)
Chloride: 97 mmol/L — ABNORMAL LOW (ref 98–111)
Creatinine, Ser: 1.22 mg/dL (ref 0.61–1.24)
GFR, Estimated: >60 mL/min (ref >60)
Glucose, Bld: 134 mg/dL — ABNORMAL HIGH (ref 70–99)
Potassium: 3 mmol/L — ABNORMAL LOW (ref 3.5–5.1)
Sodium: 133 mmol/L — ABNORMAL LOW (ref 135–145)

## 2023-06-24 LAB — CBG MONITORING, ED: Glucose-Capillary: 119 mg/dL — ABNORMAL HIGH (ref 70–99)

## 2023-06-24 LAB — GLUCOSE, CAPILLARY
Glucose-Capillary: 142 mg/dL — ABNORMAL HIGH (ref 70–99)
Glucose-Capillary: 131 mg/dL — ABNORMAL HIGH (ref 70–99)
Glucose-Capillary: 141 mg/dL — ABNORMAL HIGH (ref 70–99)

## 2023-06-24 LAB — HEPARIN LEVEL (UNFRACTIONATED)
Heparin Unfractionated: 0.25 [IU]/mL — ABNORMAL LOW (ref 0.30–0.70)
Heparin Unfractionated: <0.1 [IU]/mL — ABNORMAL LOW (ref 0.30–0.70)

## 2023-06-24 LAB — HEMOGLOBIN A1C
Hgb A1c MFr Bld: 6.9 % — ABNORMAL HIGH (ref 4.8–5.6)
Mean Plasma Glucose: 151.33 mg/dL

## 2023-06-24 MED ORDER — APIXABAN 5 MG PO TABS: 5.0000 mg | ORAL_TABLET | Freq: Two times a day (BID) | ORAL | Status: DC

## 2023-06-24 MED ORDER — APIXABAN 5 MG PO TABS
10.0000 mg | ORAL_TABLET | Freq: Two times a day (BID) | ORAL | Status: DC
  Administered 2023-06-24 – 2023-06-25 (×4): 10 mg via ORAL
  Filled 2023-06-24 (×2): qty 2
  Filled 2023-06-24: qty 4

## 2023-06-24 MED ORDER — POTASSIUM CHLORIDE CRYS ER 20 MEQ PO TBCR
60.0000 meq | EXTENDED_RELEASE_TABLET | Freq: Once | ORAL | Status: AC
  Administered 2023-06-24: 60 meq via ORAL
  Filled 2023-06-24: qty 3

## 2023-06-24 MED ORDER — PERFLUTREN LIPID MICROSPHERE
1.0000 mL | INTRAVENOUS | Status: AC | PRN
  Administered 2023-06-24: 2 mL via INTRAVENOUS

## 2023-06-24 MED ORDER — HEPARIN BOLUS VIA INFUSION
2000.0000 [IU] | Freq: Once | INTRAVENOUS | Status: AC
  Administered 2023-06-24: 2000 [IU] via INTRAVENOUS
  Filled 2023-06-24: qty 2000

## 2023-06-24 NOTE — Evaluation (Signed)
PT Cancellation Note  Patient Details Name: Scott Mcmillan MRN: [id number] DOB: 1960-10-08   Cancelled Treatment:    Reason Eval/Treat Not Completed: Patient not medically ready PT orders received, chart reviewed. Pt presenting with c/o SOB, found to have bilateral PE's & suggested R heart strain. Pt started on IV heparin <12 hours ago & currently subtherapeutic on heparin. Will hold PT evaluation/exertional activity at this time & f/u as able.  Aleda Grana, PT, DPT 06/24/23, 7:19 AM   Sandi Mariscal 06/24/2023, 7:17 AM

## 2023-06-24 NOTE — Assessment & Plan Note (Addendum)
-   subcutaneous heparin per pharmacy, consider transition to DOAC today  - DVT US pending  - Echo pending - CBC, BMP, PT/INR -PT OT -CCM

## 2023-06-24 NOTE — ED Notes (Signed)
 Pt transported to vascular.

## 2023-06-24 NOTE — Assessment & Plan Note (Addendum)
Patient does have bilateral leg swelling which he says is chronic for over 10 years.  Left greater than right with associated venous stasis changes. - consider small dose of lasix for swelling  - RLE DVT US  - CTM

## 2023-06-24 NOTE — Progress Notes (Signed)
RLE venous duplex has been completed.   Results can be found under chart review under CV PROC. 06/24/2023 10:13 AM Lelend Heinecke RVT, RDMS

## 2023-06-24 NOTE — Assessment & Plan Note (Deleted)
Pts chest x-ray with bilateral opacities. Given azithromycin and ceftriaxone x1 once in the ED. Respiratory Panel Negative and CBC w/o leukocytosis. Vitals stable and patient denying upper respiratory symptoms. Patient asymptomatic for infectious process currently. Will hold off on re-dosing antibiotics.   - consider re-dosing antibiotics if clinically worsens  - CTM  - AM CBC  - Blood cultures

## 2023-06-24 NOTE — ED Notes (Signed)
Patient moved to recliner due to discomfort in bed

## 2023-06-24 NOTE — Plan of Care (Signed)

## 2023-06-24 NOTE — Progress Notes (Signed)
PHARMACY - ANTICOAGULATION CONSULT NOTE  Pharmacy Consult for heparin Indication: pulmonary embolus  Labs: Recent Labs    06/23/23 1204 06/23/23 1456 06/24/23 0455  HGB 16.3  --  14.8  HCT 47.6  --  43.8  PLT 213  --  189  HEPARINUNFRC  --   --  0.25*  CREATININE 1.39*  --  1.22  TROPONINIHS 41* 38*  --    Assessment: 62yo male subtherapeutic on heparin with initial dosing for PE; no infusion issues or signs of bleeding per RN.  Goal of Therapy:  Heparin level 0.3-0.7 units/ml   Plan:  2000 units heparin bolus. Increase heparin infusion by 2 units/kgABW/hr to 1800 units/hr. Check level in 6 hours.   Vernard Gambles, PharmD, BCPS 06/24/2023 5:31 AM

## 2023-06-24 NOTE — Assessment & Plan Note (Addendum)
A1c 6.9 on admission. - CBG  -Continue mSSI

## 2023-06-24 NOTE — Progress Notes (Addendum)
Daily Progress Note Intern Pager: [phone]  Patient name: Scott Mcmillan Medical record number: [id number] Date of birth: Mar 14, 1961 Age: 63 y.o. Gender: male  Primary Care Provider: Marcine Matar, MD Consultants: CCM Code Status: Full  Pt Overview and Major Events to Date:  2/18: Admitted  Assessment and Plan: Patient is a 63 year old male with past medical history of type 2 diabetes, dysrhythmias, GERD, HLD and HTN presenting for shortness of breath on exertion and found to have bilateral submassive PE with right heart strain.  CCM was consulted who did not believe there was need for acute intervention at this time.  Patient is currently on heparin drip and supplemental oxygen.  Awaiting left lower extremity DVT ultrasound.  Patient to receive echo today for concern of right heart strain.  Lung exam continues to be benign, less clinical concern for pneumonia.  Assessment & Plan Pulmonary embolism (HCC) - subcutaneous heparin per pharmacy, consider transition to DOAC today  - DVT US pending  - Echo pending - CBC, BMP, PT/INR -PT OT -CCM  Leg swelling Patient does have bilateral leg swelling which he says is chronic for over 10 years.  Left greater than right with associated venous stasis changes. - consider small dose of lasix for swelling  - RLE DVT US  - CTM  Type 2 diabetes mellitus without complications (HCC) A1c 6.9 on admission. - CBG  -Continue mSSI AKI (acute kidney injury) (HCC) Pt with increased Cr 1.39 on admission, baseline appears to be 0.7-1.18. Improving after NS bolus.  - Encourage PO intake  - AM BMP  - Avoid nephrotoxic agents    Chronic and Stable Issues: HTN: ContinueHome Carvedilol, holding other home medications due to soft BPS HLD: Continue Home Pravastatin  FEN/GI: Heart/carb modified PPx: Subcutaneous heparin Dispo: Pending clinical improvement  Subjective:  Patient reports he is feeling much better than yesterday.  He reports he  has less shortness of breath and no chest pain.  Objective: Temp:  [97.8 F (36.6 C)-98.6 F (37 C)] 97.8 F (36.6 C) (02/19 0801) Pulse Rate:  [68-107] 79 (02/19 0800) Resp:  [15-28] 17 (02/19 0800) BP: (97-142)/(64-101) 142/101 (02/19 0800) SpO2:  [84 %-98 %] 97 % (02/19 0800) Weight:  [136.1 kg] 136.1 kg (02/18 1201) Physical Exam: General: Sitting in chair, NAD Cardiovascular: Regular rate and rhythm, no murmurs rubs or gallops Respiratory: Normal work of breathing on 2 L nasal cannula, clear to auscultation bilaterally Abdomen: Obese, nontender Extremities: Bilateral lower extremity edema 2+ with associated chronic venous stasis changes  Laboratory: Most recent CBC Lab Results  Component Value Date   WBC 12.0 (H) 06/24/2023   HGB 14.8 06/24/2023   HCT 43.8 06/24/2023   MCV 91.6 06/24/2023   PLT 189 06/24/2023   Most recent BMP    Latest Ref Rng & Units 06/24/2023    4:55 AM  BMP  Glucose 70 - 99 mg/dL 308   BUN 8 - 23 mg/dL 19   Creatinine 6.57 - 1.24 mg/dL 8.46   Sodium 962 - 952 mmol/L 133   Potassium 3.5 - 5.1 mmol/L 3.0   Chloride 98 - 111 mmol/L 97   CO2 22 - 32 mmol/L 22   Calcium 8.9 - 10.3 mg/dL 8.8     Other pertinent labs   Bcx: NG<24 hours A1C: 6.9%    Imaging/Diagnostic Tests: No new imaging  Penne Lash, MD 06/24/2023, 8:14 AM  PGY-1, [facility] FPTS Intern pager: [phone], text pages welcome Secure chat  group [facility] Teaching Service

## 2023-06-24 NOTE — Assessment & Plan Note (Addendum)
Pt with increased Cr 1.39 on admission, baseline appears to be 0.7-1.18. Improving after NS bolus.  - Encourage PO intake  - AM BMP  - Avoid nephrotoxic agents

## 2023-06-24 NOTE — ED Notes (Signed)
GOT PATIENT STRAIGHTEN UP IN THE BED HOOK PATIENT BACK TO MONITOR PATIENT HAS CALL BELL IN REACH

## 2023-06-24 NOTE — Telephone Encounter (Signed)
Patient Product/process development scientist completed.    The patient is insured through [facility] MEDICAID.     Ran test claim for Eliquis 5 mg and the current 30 day co-pay is $4.00.  Ran test claim for Xarelto 20 mg and the current 30 day co-pay is $4.00.  This test claim was processed through [facility]- copay amounts may vary at other pharmacies due to pharmacy/plan contracts, or as the patient moves through the different stages of their insurance plan.     Roland Earl, CPHT Pharmacy Technician III Certified Patient Advocate [facility] Patient Advocate Team Direct Number: [phone]  Fax: [phone]

## 2023-06-24 NOTE — Progress Notes (Signed)
OT Cancellation Note  Patient Details Name: Scott Mcmillan MRN: [id number] DOB: 1961-03-08   Cancelled Treatment:    Reason Eval/Treat Not Completed: Medical issues which prohibited therapy. Pt with B PEs, heparin initiated <12 hours ago. Will follow.   Evern Bio 06/24/2023, 7:57 AM Berna Spare, OTR/L Acute Rehabilitation Services Office: [phone]

## 2023-06-24 NOTE — Discharge Instructions (Signed)
Information on my medicine - ELIQUIS (apixaban)  This medication education was reviewed with me or my healthcare representative as part of my discharge preparation.    Why was Eliquis prescribed for you? Eliquis was prescribed to treat blood clots that may have been found in the veins of your legs (deep vein thrombosis) or in your lungs (pulmonary embolism) and to reduce the risk of them occurring again.  What do You need to know about Eliquis ? The starting dose is 10 mg (two 5 mg tablets) taken TWICE daily for the FIRST SEVEN (7) DAYS, then on (enter date)  07/01/2023  the dose is reduced to ONE 5 mg tablet taken TWICE daily.  Eliquis may be taken with or without food.   Try to take the dose about the same time in the morning and in the evening. If you have difficulty swallowing the tablet whole please discuss with your pharmacist how to take the medication safely.  Take Eliquis exactly as prescribed and DO NOT stop taking Eliquis without talking to the doctor who prescribed the medication.  Stopping may increase your risk of developing a new blood clot.  Refill your prescription before you run out.  After discharge, you should have regular check-up appointments with your healthcare provider that is prescribing your Eliquis.    What do you do if you miss a dose? If a dose of ELIQUIS is not taken at the scheduled time, take it as soon as possible on the same day and twice-daily administration should be resumed. The dose should not be doubled to make up for a missed dose.  Important Safety Information A possible side effect of Eliquis is bleeding. You should call your healthcare provider right away if you experience any of the following: Bleeding from an injury or your nose that does not stop. Unusual colored urine (red or dark brown) or unusual colored stools (red or black). Unusual bruising for unknown reasons. A serious fall or if you hit your head (even if there is no  bleeding).  Some medicines may interact with Eliquis and might increase your risk of bleeding or clotting while on Eliquis. To help avoid this, consult your healthcare provider or pharmacist prior to using any new prescription or non-prescription medications, including herbals, vitamins, non-steroidal anti-inflammatory drugs (NSAIDs) and supplements.  This website has more information on Eliquis (apixaban): http://www.eliquis.com/eliquis/home

## 2023-06-25 ENCOUNTER — Other Ambulatory Visit (HOSPITAL_COMMUNITY): Payer: Self-pay

## 2023-06-25 DIAGNOSIS — I2699 Other pulmonary embolism without acute cor pulmonale: Secondary | ICD-10-CM | POA: Diagnosis not present

## 2023-06-25 LAB — BASIC METABOLIC PANEL
Anion gap: 12 (ref 5–15)
BUN: 18 mg/dL (ref 8–23)
CO2: 21 mmol/L — ABNORMAL LOW (ref 22–32)
Calcium: 9.3 mg/dL (ref 8.9–10.3)
Chloride: 103 mmol/L (ref 98–111)
Creatinine, Ser: 1.1 mg/dL (ref 0.61–1.24)
GFR, Estimated: >60 mL/min (ref >60)
Glucose, Bld: 134 mg/dL — ABNORMAL HIGH (ref 70–99)
Potassium: 3.8 mmol/L (ref 3.5–5.1)
Sodium: 136 mmol/L (ref 135–145)

## 2023-06-25 LAB — CBC
HCT: 44.6 % (ref 39.0–52.0)
Hemoglobin: 15.3 g/dL (ref 13.0–17.0)
MCH: 30.9 pg (ref 26.0–34.0)
MCHC: 34.3 g/dL (ref 30.0–36.0)
MCV: 90.1 fL (ref 80.0–100.0)
Platelets: 194 10*3/uL (ref 150–400)
RBC: 4.95 MIL/uL (ref 4.22–5.81)
RDW: 12.9 % (ref 11.5–15.5)
WBC: 10.9 10*3/uL — ABNORMAL HIGH (ref 4.0–10.5)
nRBC: 0 % (ref 0.0–0.2)

## 2023-06-25 LAB — GLUCOSE, CAPILLARY: Glucose-Capillary: 237 mg/dL — ABNORMAL HIGH (ref 70–99)

## 2023-06-25 MED ORDER — POLYETHYLENE GLYCOL 3350 17 GM/SCOOP PO POWD
17.0000 g | Freq: Every day | ORAL | 0 refills | Status: AC | PRN
  Filled 2023-06-25: qty 238, 14d supply, fill #0

## 2023-06-25 MED ORDER — APIXABAN 5 MG PO TABS
ORAL_TABLET | ORAL | 0 refills | Status: DC
  Filled 2023-06-25: qty 60, 24d supply, fill #0

## 2023-06-25 NOTE — Assessment & Plan Note (Deleted)
Transitioned to Eliquis yesterday.   - DVT US pending  - CBC, BMP, PT/INR -PT OT -CCM

## 2023-06-25 NOTE — Assessment & Plan Note (Deleted)
A1c 6.9 on admission. - CBG  -Continue mSSI

## 2023-06-25 NOTE — Discharge Summary (Signed)
Family Medicine Teaching [facility] Discharge Summary  Patient name: Scott Mcmillan Medical record number: [id number] Date of birth: 11-20-1960 Age: 63 y.o. Gender: male Date of Admission: 06/23/2023  Date of Discharge: 06/25/2023 Admitting Physician: Peterson Ao, MD  Primary Care Provider: Marcine Matar, MD Consultants: None  Indication for Hospitalization: Dyspnea  Discharge Diagnoses/Problem List:  Principal Problem for Admission:  Other Problems addressed during stay:  Principal Problem:   Pulmonary embolism [facility]) Active Problems:   Leg swelling   Type 2 diabetes mellitus without complications (HCC)   Abnormal chest x-ray   AKI (acute kidney injury) [facility])   Hypoxia    Brief Hospital Course:  [name] is a 63 y.o. male who was admitted to the [facility] Medicine Teaching Service at [facility] for SOB and found to have bilateral submassive PE, PNA, AKI.  PMH significant for type 2 diabetes, GERD, hyperlipidemia, hypertension.  Hospital course is outlined below by problem.   Bilateral submassive PE Presented with shortness of breath on exertion in the setting of immobility with recent bilateral inguinal hernia surgery.  He had new oxygen requirement of 3 L O2. CT PE with submassive PE with concern for right heart strain.  CXR initially concerning for pneumonia but clinically well-appearing, antibiotics discontinued.  DVT ultrasound negative in the left and right.  Echo with EF 65 to 70% and G1 DD.  He was started on IV heparin and transitioned to Eliquis on 2/19 for provoked thrombus.  Plan for 3 to 46-month anticoagulation course.  AKI Cr on admission 1.39 up from baseline around 1.  Likely prerenal from dehydration.  Encouraged p.o. intake with improvement of creatinine to start for start by discharge.  Other conditions that were chronic and stable:  HTN: ContinueHome Carvedilol, held other home medications due to soft BPS HLD: Continue Home Pravastatin  Issues for  follow up: ASA stopped at discharge as it was for primary prevention  Amlodipine, hydrochlorothiazide, Lisinopril held during admission for soft BP, consider restarting     Disposition: Home  Discharge Condition: Improved  Discharge Exam:  Vitals:   06/25/23 0342 06/25/23 0758  BP: 136/79 127/79  Pulse: 79 88  Resp: 16 17  Temp: 97.6 F (36.4 C) 98.2 F (36.8 C)  SpO2: 95% 92%   Physical Exam: General: NAD, conversant, good mood, up walking Cardiovascular: RRR, NRMG Respiratory: CTABL, normal WOB Abdomen: Soft, nttp, non-distended Extremities: Moving all extremities  Significant Procedures: None  Significant Labs and Imaging:  Recent Labs  Lab 06/23/23 1204 06/24/23 0455 06/25/23 0324  WBC 17.0* 12.0* 10.9*  HGB 16.3 14.8 15.3  HCT 47.6 43.8 44.6  PLT 213 189 194   Recent Labs  Lab 06/23/23 1204 06/24/23 0455 06/25/23 0324  NA 133* 133* 136  K 3.5 3.0* 3.8  CL 96* 97* 103  CO2 21* 22 21*  GLUCOSE 159* 134* 134*  BUN 17 19 18   CREATININE 1.39* 1.22 1.10  CALCIUM 9.4 8.8* 9.3    ECHOCARDIOGRAM COMPLETE Result Date: 06/24/2023    ECHOCARDIOGRAM REPORT   Patient Name:   Scott Mcmillan Date of Exam: 06/24/2023 Medical Rec #:  [id number]     Height:       67.0 in Accession #:    [id number]    Weight:       300.0 lb Date of Birth:  June 03, 1960     BSA:          2.403 m Patient Age:    74 years  BP:           113/76 mmHg Patient Gender: M             HR:           80 bpm. Exam Location:  Inpatient Procedure: 2D Echo, Cardiac Doppler, Color Doppler and Intracardiac            Opacification Agent (Both Spectral and Color Flow Doppler were            utilized during procedure). Indications:    Pulmonary Embolus I26.09  History:        Patient has no prior history of Echocardiogram examinations.                 Risk Factors:Hypertension and Diabetes.  Sonographer:    Darlys Gales Referring Phys: Evette Georges IMPRESSIONS  1. Left ventricular ejection fraction, by estimation,  is 65 to 70%. The left ventricle has normal function. The left ventricle has no regional wall motion abnormalities. Left ventricular diastolic parameters are consistent with Grade I diastolic dysfunction (impaired relaxation).  2. Right ventricular systolic function was not well visualized. The right ventricular size is moderately enlarged.  3. The mitral valve is normal in structure. No evidence of mitral valve regurgitation. No evidence of mitral stenosis.  4. The aortic valve is normal in structure. Aortic valve regurgitation is not visualized. No aortic stenosis is present.  5. The inferior vena cava is normal in size with greater than 50% respiratory variability, suggesting right atrial pressure of 3 mmHg. FINDINGS  Left Ventricle: Left ventricular ejection fraction, by estimation, is 65 to 70%. The left ventricle has normal function. The left ventricle has no regional wall motion abnormalities. Strain imaging was not performed. The left ventricular internal cavity  size was normal in size. There is no left ventricular hypertrophy. Left ventricular diastolic parameters are consistent with Grade I diastolic dysfunction (impaired relaxation). Right Ventricle: The right ventricular size is moderately enlarged. No increase in right ventricular wall thickness. Right ventricular systolic function was not well visualized. Left Atrium: Left atrial size was normal in size. Right Atrium: Right atrial size was normal in size. Pericardium: There is no evidence of pericardial effusion. Mitral Valve: The mitral valve is normal in structure. No evidence of mitral valve regurgitation. No evidence of mitral valve stenosis. Tricuspid Valve: The tricuspid valve is normal in structure. Tricuspid valve regurgitation is not demonstrated. No evidence of tricuspid stenosis. Aortic Valve: The aortic valve is normal in structure. Aortic valve regurgitation is not visualized. No aortic stenosis is present. Aortic valve mean gradient  measures 4.0 mmHg. Aortic valve peak gradient measures 7.0 mmHg. Aortic valve area, by VTI measures 2.73 cm. Pulmonic Valve: The pulmonic valve was normal in structure. Pulmonic valve regurgitation is not visualized. No evidence of pulmonic stenosis. Aorta: The aortic root is normal in size and structure. Venous: The inferior vena cava is normal in size with greater than 50% respiratory variability, suggesting right atrial pressure of 3 mmHg. IAS/Shunts: No atrial level shunt detected by color flow Doppler. Additional Comments: 3D imaging was not performed.  LEFT VENTRICLE PLAX 2D LVIDd:         5.40 cm   Diastology LVIDs:         4.00 cm   LV e' medial:    7.40 cm/s LV PW:         0.90 cm   LV E/e' medial:  10.8 LV IVS:  1.00 cm   LV e' lateral:   11.30 cm/s LVOT diam:     1.90 cm   LV E/e' lateral: 7.1 LV SV:         61 LV SV Index:   25 LVOT Area:     2.84 cm  RIGHT VENTRICLE RV Basal diam:  4.80 cm RV Mid diam:    4.40 cm RV S prime:     11.90 cm/s TAPSE (M-mode): 2.2 cm LEFT ATRIUM             Index        RIGHT ATRIUM           Index LA Vol (A2C):   36.9 ml 15.36 ml/m  RA Area:     16.10 cm LA Vol (A4C):   50.2 ml 20.89 ml/m  RA Volume:   39.80 ml  16.57 ml/m LA Biplane Vol: 43.3 ml 18.02 ml/m  AORTIC VALVE AV Area (Vmax):    2.62 cm AV Area (Vmean):   2.79 cm AV Area (VTI):     2.73 cm AV Vmax:           132.00 cm/s AV Vmean:          94.700 cm/s AV VTI:            0.222 m AV Peak Grad:      7.0 mmHg AV Mean Grad:      4.0 mmHg LVOT Vmax:         122.00 cm/s LVOT Vmean:        93.200 cm/s LVOT VTI:          0.214 m LVOT/AV VTI ratio: 0.96  AORTA Ao Root diam: 3.40 cm MITRAL VALVE MV Area (PHT): 2.93 cm    SHUNTS MV Decel Time: 259 msec    Systemic VTI:  0.21 m MV E velocity: 80.20 cm/s  Systemic Diam: 1.90 cm MV A velocity: 80.20 cm/s MV E/A ratio:  1.00 Donato Schultz MD Electronically signed by Donato Schultz MD Signature Date/Time: 06/24/2023/3:36:28 PM    Final      Results/Tests Pending at  Time of Discharge: none  Discharge Medications:  Allergies as of 06/25/2023   No Known Allergies      Medication List     STOP taking these medications    amLODipine 10 MG tablet Commonly known as: NORVASC   aspirin EC 81 MG tablet   hydrochlorothiazide 25 MG tablet Commonly known as: HYDRODIURIL   ibuprofen 200 MG tablet Commonly known as: ADVIL   lisinopril 40 MG tablet Commonly known as: ZESTRIL       TAKE these medications    Accu-Chek Guide Test test strip Generic drug: glucose blood Use to check blood sugar 3 times daily.   Accu-Chek Guide w/Device Kit Use to check blood sugar 3 times daily.   Accu-Chek Softclix Lancets lancets Use to check blood sugar 3 times daily.   apixaban 5 MG Tabs tablet Commonly known as: ELIQUIS Take 2 tablets (10 mg total) by mouth 2 (two) times daily for 6 days, THEN 1 tablet (5 mg total) 2 (two) times daily. Start taking on: June 25, 2023   carvedilol 12.5 MG tablet Commonly known as: COREG Take 1 tablet (12.5 mg total) by mouth 2 (two) times daily with a meal.   polyethylene glycol powder 17 GM/SCOOP powder Commonly known as: GLYCOLAX/MIRALAX Mix as directed and take 1 capful (17 g) with water by mouth daily as needed for mild constipation.  pravastatin 40 MG tablet Commonly known as: PRAVACHOL Take 1 tablet (40 mg total) by mouth daily.   PREPARATION H EX Apply 1 Application topically daily as needed (hemmorhoids).   STOOL SOFTENER LAXATIVE PO Take 100 mg by mouth. Takes 2 to 4 capsules daily as needed.   Trulicity 0.75 MG/0.5ML Soaj Generic drug: Dulaglutide Inject 0.75 mg into the skin once a week.        Discharge Instructions: Please refer to Patient Instructions section of EMR for full details.  Patient was counseled important signs and symptoms that should prompt return to medical care, changes in medications, dietary instructions, activity restrictions, and follow up appointments.   Follow-Up  Appointments:  Follow-up Information     Marcine Matar, MD. Schedule an appointment as soon as possible for a visit in 1 week(s).   Specialty: Internal Medicine Contact information: [address] [place] [address] [place] [address] [phone]                 Bess Kinds, MD 06/25/2023, 11:54 AM PGY-3, [facility] Family Medicine

## 2023-06-25 NOTE — Assessment & Plan Note (Deleted)
Now resolved, Cr today 1.10 w/ baseline 0.7-1.18. - Encourage PO intake  - AM BMP  - Avoid nephrotoxic agents

## 2023-06-25 NOTE — Assessment & Plan Note (Deleted)
Patient does have bilateral leg swelling which he says is chronic for over 10 years.  Left greater than right with associated venous stasis changes. - consider small dose of lasix for swelling  - RLE DVT US  - CTM

## 2023-06-25 NOTE — Evaluation (Signed)
Physical Therapy Evaluation and Discharge Patient Details Name: Scott Mcmillan MRN: [id number] DOB: 05/11/1960 Today's Date: 06/25/2023  History of Present Illness  Pt is a 63 y.o. male presenting 06/23/23 with a 2 day hx of SOB on exertion. Admitted with pulmonary embolism. PMH: DM2, dysrhythmias, GERD, HLD, AKI, and HTN  Clinical Impression  Pt is a 63 y.o. male presenting on 06/23/23 with above conditions and deficits below, see PT Problem List. PTA pt lived alone and independently in a two story house with 14 STE. Currently, pt is independent in functional mobility and at his functional baseline. Given pt's PLOF, current condition, and prognosis, pt doesn't require additional acute or follow-up PT. Pt educated on alerting nurses if he doesn't feel he can safely perform functional mobility in his room/hall. Pt responded with verbal understanding. All education and questions answered. Will sign off acute PT care.          If plan is discharge home, recommend the following: Help with stairs or ramp for entrance   Can travel by private vehicle        Equipment Recommendations None recommended by PT  Recommendations for Other Services       Functional Status Assessment Patient has not had a recent decline in their functional status     Precautions / Restrictions Precautions Precautions: None Restrictions Weight Bearing Restrictions Per Provider Order: No      Mobility  Bed Mobility               General bed mobility comments: Pt in recliner upon arrival and returned to EOB. Pt appears capable of independent bed mobility    Transfers Overall transfer level: Independent Equipment used: None Transfers: Sit to/from Stand Sit to Stand: Independent           General transfer comment: Pt doesn't require additional time, able to raise from multiple heights w/o difficulty, doesn't require AD, or cueing.    Ambulation/Gait Ambulation/Gait assistance: Independent,  Supervision Gait Distance (Feet): 280 Feet (1st 260 ft gait; 2nd 20 ft for TUG) Assistive device: None Gait Pattern/deviations: Step-through pattern Gait velocity: normal Gait velocity interpretation: >4.37 ft/sec, indicative of normal walking speed   General Gait Details: Pt walks with slight increased heel strike and is normally a slower walker. Supervision for safety at beginning 50 ft, Independent thereafter  Stairs            Wheelchair Mobility     Tilt Bed    Modified Rankin (Stroke Patients Only)       Balance Overall balance assessment: Needs assistance Sitting-balance support: No upper extremity supported, Feet supported Sitting balance-Leahy Scale: Normal Sitting balance - Comments: Pt sits EOB and in recliner with no directional lean or LOB.   Standing balance support: No upper extremity supported, During functional activity Standing balance-Leahy Scale: Normal Standing balance comment: Pt stands with wide BOS with increased out-toeing. No LOB             High level balance activites: Side stepping, Backward walking, Turns, Sudden stops, Head turns High Level Balance Comments: Pt minimally reduces speed with head turns (horizontal > vertical), has equal stride length walking backwards and sideways. No LOB Standardized Balance Assessment Standardized Balance Assessment : TUG: Timed Up and Go Test     Timed Up and Go Test TUG: Normal TUG Normal TUG (seconds): 10     Pertinent Vitals/Pain Pain Assessment Pain Assessment: No/denies pain    Home Living Family/patient expects to be discharged to:: Private  residence Living Arrangements: Alone Available Help at Discharge: Friend(s);Available PRN/intermittently Type of Home: House Home Access: Stairs to enter Entrance Stairs-Rails: Left Entrance Stairs-Number of Steps: 14 (6 then a landing, around 8 after) Alternate Level Stairs-Number of Steps: flight. Home Layout: Two level;Able to live on main  level with bedroom/bathroom Home Equipment: Rolling Walker (2 wheels);Tub bench;Grab bars - toilet;Grab bars - tub/shower Additional Comments: Pt uses 2nd floor as storage and doesn't go up often    Prior Function Prior Level of Function : Independent/Modified Independent             Mobility Comments: independent without AD ADLs Comments: Drives. Doesn't work but looking to return once health improves     Extremity/Trunk Assessment   Upper Extremity Assessment Upper Extremity Assessment: Overall WFL for tasks assessed    Lower Extremity Assessment Lower Extremity Assessment: Overall WFL for tasks assessed    Cervical / Trunk Assessment Cervical / Trunk Assessment: Kyphotic  Communication   Communication Communication: No apparent difficulties    Cognition Arousal: Alert Behavior During Therapy: WFL for tasks assessed/performed   PT - Cognitive impairments: No apparent impairments                         Following commands: Intact       Cueing Cueing Techniques: Verbal cues     General Comments General comments (skin integrity, edema, etc.): VSS throughout session. No complaints of dizziness, fatigue, lightheadedness, etc.    Exercises     Assessment/Plan    PT Assessment Patient does not need any further PT services  PT Problem List         PT Treatment Interventions      PT Goals (Current goals can be found in the Care Plan section)  Acute Rehab PT Goals Patient Stated Goal: return home PT Goal Formulation: All assessment and education complete, DC therapy Time For Goal Achievement: 06/25/23 Potential to Achieve Goals: Good    Frequency       Co-evaluation               AM-PAC PT "6 Clicks" Mobility  Outcome Measure Help needed turning from your back to your side while in a flat bed without using bedrails?: None Help needed moving from lying on your back to sitting on the side of a flat bed without using bedrails?:  None Help needed moving to and from a bed to a chair (including a wheelchair)?: None Help needed standing up from a chair using your arms (e.g., wheelchair or bedside chair)?: None Help needed to walk in hospital room?: None Help needed climbing 3-5 steps with a railing? : None 6 Click Score: 24    End of Session Equipment Utilized During Treatment: Gait belt Activity Tolerance: Patient tolerated treatment well Patient left: Other (comment) (EOB) Nurse Communication: Mobility status PT Visit Diagnosis: Other (comment) (decreased activity tolerance)    Time: 1610-9604 PT Time Calculation (min) (ACUTE ONLY): 24 min   Charges:   PT Evaluation $PT Eval Low Complexity: 1 Low PT Treatments $Therapeutic Activity: 8-22 mins PT General Charges $$ ACUTE PT VISIT: 1 Visit       [address], SPT   [place] 06/25/2023, 10:21 AM

## 2023-06-26 ENCOUNTER — Encounter: Payer: Self-pay | Admitting: Internal Medicine

## 2023-06-26 ENCOUNTER — Other Ambulatory Visit: Payer: Self-pay

## 2023-06-26 ENCOUNTER — Ambulatory Visit: Payer: Medicaid Other | Attending: Internal Medicine | Admitting: Internal Medicine

## 2023-06-26 VITALS — BP 111/75 | HR 80 | Temp 97.5°F | Ht 67.0 in | Wt 295.0 lb

## 2023-06-26 DIAGNOSIS — E1169 Type 2 diabetes mellitus with other specified complication: Secondary | ICD-10-CM | POA: Diagnosis not present

## 2023-06-26 DIAGNOSIS — M25552 Pain in left hip: Secondary | ICD-10-CM

## 2023-06-26 DIAGNOSIS — I2699 Other pulmonary embolism without acute cor pulmonale: Secondary | ICD-10-CM | POA: Diagnosis not present

## 2023-06-26 DIAGNOSIS — I10 Essential (primary) hypertension: Secondary | ICD-10-CM | POA: Diagnosis not present

## 2023-06-26 DIAGNOSIS — Z09 Encounter for follow-up examination after completed treatment for conditions other than malignant neoplasm: Secondary | ICD-10-CM | POA: Diagnosis not present

## 2023-06-26 DIAGNOSIS — Z7985 Long-term (current) use of injectable non-insulin antidiabetic drugs: Secondary | ICD-10-CM

## 2023-06-26 DIAGNOSIS — G8929 Other chronic pain: Secondary | ICD-10-CM | POA: Diagnosis not present

## 2023-06-26 DIAGNOSIS — M25551 Pain in right hip: Secondary | ICD-10-CM

## 2023-06-26 DIAGNOSIS — E1159 Type 2 diabetes mellitus with other circulatory complications: Secondary | ICD-10-CM

## 2023-06-26 MED ORDER — TRULICITY 1.5 MG/0.5ML ~~LOC~~ SOAJ
1.5000 mg | SUBCUTANEOUS | 3 refills | Status: DC
  Filled 2023-06-26: qty 2, 28d supply, fill #0
  Filled 2023-07-17: qty 2, 28d supply, fill #1
  Filled 2023-08-14: qty 2, 28d supply, fill #2
  Filled 2023-09-14: qty 2, 28d supply, fill #3

## 2023-06-26 NOTE — Patient Instructions (Addendum)
Increase Trulicity to 1.5 mg once a week.  An updated prescription has been sent to your pharmacy.  Please let me know if you develop any vomiting, abdominal pain, severe diarrhea or constipation with the increased dose.  Continue to hold amlodipine, lisinopril and hydrochlorothiazide for now.  Continue to check your blood pressure daily.  If your blood pressure starts increasing above 130/80, restart the amlodipine 10 mg daily first.  If the blood pressure does not come under control with just the amlodipine after several days, you can then restart the lisinopril.  Hold off on the hydrochlorothiazide.  Please go to [facility] imaging at [address] to have the x-rays done on the hips.  Let me know once you are done with the current bottle of Eliquis as I will need to refill it.  Plan is to keep you on it for 3 to 6 months.

## 2023-06-26 NOTE — Progress Notes (Signed)
Patient ID: Scott Mcmillan, male    DOB: 05-11-1960  MRN: [id number]  CC: Hospitalization Follow-up (Hospitalization f/u. Franchot Erichsen eliquis dosage change & trulicity/Brought BS readings )   Subjective: Scott Mcmillan is a 63 y.o. male who presents for hospital follow-up His concerns today include:  Patient with history of HTN, HL, morbid obesity, DM.   Patient hospitalized 2/18-20/2024 with shortness of breath on exertion in the setting of immobility status post recent hernia surgery done 06/10/2023.  Found to have submassive bilateral PE.  There was also question of pneumonia and he was started on antibiotics.  Echo revealed EF of 65 to 70% with G1DD.  Discharged on Eliquis for plan of 3 to 6 months course of anticoagulation.  Had mild renal insufficiency with creatinine increased slightly from his baseline.  Blood pressure was soft.  HCTZ, amlodipine and lisinopril were held.  Carvedilol continued.  Today: Patient reports that shortness of breath and chest pains have resolved.  He did fill prescription for Eliquis today. -No prior history of DVT or PE.  No family history of blood clots. Endorses not being very mobile for about 9 to 10 days after recent attempt at surgery for inguinal hernias.  Laparoscopic surgery that was done by Dr. Hillery Hunter was aborted due to large amount of intraperitoneal fat and fat within the preperitoneal spaces that made identification of anatomy difficult. -He has a follow-up with the surgeon who plans to do a CAT scan to reevaluate the hernias before making any future surgical plans. -Patient tells me that he continues to have some pain in both groin areas especially when he moves the legs like elevating them to get in the car or to climb over something. Surgical incisions have been healing well.  DM: Started on Trulicity on last visit with me in December.  Metformin was discontinued.  Reports 1 episode of diarrhea since being on the Trulicity otherwise tolerating  well.  Reports decreased appetite on the medication.  He is down 14 pounds since last visit.  He would like to try higher dose of the medicine.  A1c decreased from 8.3 to 6.9 2 days ago  HTN: He is taking the carvedilol as prescribed.  He did fill prescriptions today at the pharmacy for the HCTZ 25 mg lisinopril 40 mg and amlodipine 10 mg but has not restarted them as yet.  These were held during hospitalization because blood pressure was soft.  Patient Active Problem List   Diagnosis Date Noted   Pulmonary embolism (HCC) 06/23/2023   Leg swelling 06/23/2023   Type 2 diabetes mellitus without complications (HCC) 06/23/2023   Abnormal chest x-ray 06/23/2023   AKI (acute kidney injury) (HCC) 06/23/2023   Hypoxia 06/23/2023   Hyperlipidemia associated with type 2 diabetes mellitus (HCC) 11/08/2019   Influenza vaccine needed 01/13/2019   Non-recurrent bilateral inguinal hernia without obstruction or gangrene 04/23/2018   Hemorrhoids 01/22/2018   Actinic keratosis due to exposure to sunlight 07/21/2017   Lower extremity edema 07/21/2017   Class 3 severe obesity due to excess calories without serious comorbidity with body mass index (BMI) of 50.0 to 59.9 in adult (HCC) 07/21/2017   Hyperlipidemia with target low density lipoprotein (LDL) cholesterol less than 100 mg/dL 81/19/1478   Hypertension 10/31/2016     Current Outpatient Medications on File Prior to Visit  Medication Sig Dispense Refill   Accu-Chek Softclix Lancets lancets Use to check blood sugar 3 times daily. 100 each 6   apixaban (ELIQUIS) 5 MG TABS tablet  Take 2 tablets (10 mg total) by mouth 2 (two) times daily for 6 days, THEN 1 tablet (5 mg total) 2 (two) times daily. 60 tablet 0   Blood Glucose Monitoring Suppl (ACCU-CHEK GUIDE) w/Device KIT Use to check blood sugar 3 times daily. 1 kit 0   carvedilol (COREG) 12.5 MG tablet Take 1 tablet (12.5 mg total) by mouth 2 (two) times daily with a meal. 180 tablet 1   Docusate Sodium  (STOOL SOFTENER LAXATIVE PO) Take 100 mg by mouth. Takes 2 to 4 capsules daily as needed.     glucose blood (ACCU-CHEK GUIDE TEST) test strip Use to check blood sugar 3 times daily. 100 each 6   Hydrocortisone (PREPARATION H EX) Apply 1 Application topically daily as needed (hemmorhoids).     polyethylene glycol powder (GLYCOLAX/MIRALAX) 17 GM/SCOOP powder Mix as directed and take 1 capful (17 g) with water by mouth daily as needed for mild constipation. 238 g 0   pravastatin (PRAVACHOL) 40 MG tablet Take 1 tablet (40 mg total) by mouth daily. 90 tablet 1   No current facility-administered medications on file prior to visit.    No Known Allergies  Social History   Socioeconomic History   Marital status: Single    Spouse name: Not on file   Number of children: Not on file   Years of education: Not on file   Highest education level: Not on file  Occupational History   Not on file  Tobacco Use   Smoking status: Never   Smokeless tobacco: Never  Vaping Use   Vaping status: Never Used  Substance and Sexual Activity   Alcohol use: No   Drug use: No   Sexual activity: Not Currently  Other Topics Concern   Not on file  Social History Narrative   Not on file   Social Drivers of Health   Financial Resource Strain: Medium Risk (04/20/2023)   Overall Financial Resource Strain (CARDIA)    Difficulty of Paying Living Expenses: Somewhat hard  Food Insecurity: No Food Insecurity (06/24/2023)   Hunger Vital Sign    Worried About Running Out of Food in the Last Year: Never true    Ran Out of Food in the Last Year: Never true  Transportation Needs: No Transportation Needs (06/24/2023)   PRAPARE - Administrator, Civil Service (Medical): No    Lack of Transportation (Non-Medical): No  Physical Activity: Inactive (04/20/2023)   Exercise Vital Sign    Days of Exercise per Week: 0 days    Minutes of Exercise per Session: 0 min  Stress: No Stress Concern Present (04/20/2023)    [employer] of Occupational Health - Occupational Stress Questionnaire    Feeling of Stress : Not at all  Social Connections: Moderately Isolated (04/20/2023)   Social Connection and Isolation Panel [NHANES]    Frequency of Communication with Friends and Family: More than three times a week    Frequency of Social Gatherings with Friends and Family: Once a week    Attends Religious Services: Never    Database administrator or Organizations: No    Attends Engineer, structural: 1 to 4 times per year    Marital Status: Never married  Intimate Partner Violence: Not At Risk (06/24/2023)   Humiliation, Afraid, Rape, and Kick questionnaire    Fear of Current or Ex-Partner: No    Emotionally Abused: No    Physically Abused: No    Sexually Abused: No  Family History  Problem Relation Age of Onset   Heart disease Mother    Lung cancer Father    Colon cancer Neg Hx    Esophageal cancer Neg Hx    Liver cancer Neg Hx    Pancreatic cancer Neg Hx    Rectal cancer Neg Hx    Stomach cancer Neg Hx     Past Surgical History:  Procedure Laterality Date   COLONOSCOPY  05/2017   FISTULOTOMY N/A 12/12/2021   Procedure: SETON PLACEMENT;  Surgeon: Romie Levee, MD;  Location: [facility] [place];  Service: General;  Laterality: N/A;   LIGATION OF INTERNAL FISTULA TRACT N/A 05/22/2022   Procedure: LIGATION OF INTERNAL FISTULA TRACT;  Surgeon: Romie Levee, MD;  Location: [facility];  Service: General;  Laterality: N/A;    ROS: Review of Systems Negative except as stated above  PHYSICAL EXAM: BP 111/75 (BP Location: Left Arm, Patient Position: Standing, Cuff Size: Large)   Pulse 80   Temp (!) 97.5 F (36.4 C) (Oral)   Ht 5\' 7"  (1.702 m)   Wt 295 lb (133.8 kg)   SpO2 94%   BMI 46.20 kg/m   Wt Readings from Last 3 Encounters:  06/26/23 295 lb (133.8 kg)  06/24/23 293 lb 3.2 oz (133 kg)  06/10/23 300 lb (136.1 kg)    Physical Exam   General  appearance - alert, well appearing, older Caucasian male and in no distress Mental status - normal mood, behavior, speech, dress, motor activity, and thought processes Chest - clear to auscultation, no wheezes, rales or rhonchi, symmetric air entry Heart - normal rate, regular rhythm, normal S1, S2, no murmurs, rubs, clicks or gallops Abdomen -patient noted to have 3 surgical incisions on the lower abdomen the highest 1 being above the umbilicus.  They appear slightly red but nontender and no drainage.  They appear to be healing well. Extremities - peripheral pulses normal, no pedal edema, no clubbing or cyanosis     Latest Ref Rng & Units 06/25/2023    3:24 AM 06/24/2023    4:55 AM 06/23/2023   12:04 PM  CMP  Glucose 70 - 99 mg/dL 161  096  045   BUN 8 - 23 mg/dL 18  19  17    Creatinine 0.61 - 1.24 mg/dL 4.09  8.11  9.14   Sodium 135 - 145 mmol/L 136  133  133   Potassium 3.5 - 5.1 mmol/L 3.8  3.0  3.5   Chloride 98 - 111 mmol/L 103  97  96   CO2 22 - 32 mmol/L 21  22  21    Calcium 8.9 - 10.3 mg/dL 9.3  8.8  9.4    Lipid Panel     Component Value Date/Time   CHOL 145 12/19/2022 1634   TRIG 144 12/19/2022 1634   HDL 37 (L) 12/19/2022 1634   CHOLHDL 3.9 12/19/2022 1634   LDLCALC 83 12/19/2022 1634    CBC    Component Value Date/Time   WBC 10.9 (H) 06/25/2023 0324   RBC 4.95 06/25/2023 0324   HGB 15.3 06/25/2023 0324   HGB 15.5 12/19/2022 1634   HCT 44.6 06/25/2023 0324   HCT 46.1 12/19/2022 1634   PLT 194 06/25/2023 0324   PLT 226 12/19/2022 1634   MCV 90.1 06/25/2023 0324   MCV 90 12/19/2022 1634   MCH 30.9 06/25/2023 0324   MCHC 34.3 06/25/2023 0324   RDW 12.9 06/25/2023 0324   RDW 12.8 12/19/2022 1634  ASSESSMENT AND PLAN: 1. Hospital discharge follow-up (Primary)  2. Pulmonary embolism, bilateral (HCC) Likely provoked from him being sedentary post surgical procedure that was done 06/10/2023.  Advised that usually with provoke DVT/PE we treat for 3 months with  the anticoagulant.  However given he has had submassive bilateral PE, I think we should extend this out to about 6 months.  Advised to report any bleeding including blood in the stools, urine or epistaxis.  Report any excessive bruising.  Advised to avoid falls -Cannot have any further elective surgery done at this time until he has been on anticoagulation for at least 3 months.  3. Type 2 diabetes mellitus with morbid obesity (HCC) Patient has lost 14 pounds within the past 2 months on the Trulicity.  He is tolerating the medication well.  We discussed increasing the dose to the 1.5 mg once a week.  Again advised patient to stop the medicine and come in if he develops any vomiting, abdominal pain, severe diarrhea or constipation. - Dulaglutide (TRULICITY) 1.5 MG/0.5ML SOAJ; Inject 1.5 mg into the skin once a week.  Dispense: 2 mL; Refill: 3  4. Long-term (current) use of injectable non-insulin antidiabetic drugs See #3 above  5. Hypertension associated with diabetes (HCC) Blood pressure today is still very good just on the carvedilol.  Advised to continue to hold amlodipine, HCTZ and lisinopril.  He is just 1 day postdischarge from the hospital.  Advised patient to check blood pressure daily.  If he finds that blood pressure starts increasing above 130/80, he should restart amlodipine 10 mg daily.  Instructions written out for him on his discharge summary.  Will have him follow-up in April but can be seen sooner if any concerns.  6. Chronic hip pain, bilateral Suspect he may have arthritis in the hip joints.  He is agreeable to having x-rays done. - DG Hip Unilat W OR W/O Pelvis Min 4 Views Left; Future - DG Hip Unilat W OR W/O Pelvis Min 4 Views Right; Future    Patient was given the opportunity to ask questions.  Patient verbalized understanding of the plan and was able to repeat key elements of the plan.   This documentation was completed using Paediatric nurse.  Any  transcriptional errors are unintentional.  Orders Placed This Encounter  Procedures   DG Hip Unilat W OR W/O Pelvis Min 4 Views Left   DG Hip Unilat W OR W/O Pelvis Min 4 Views Right     Requested Prescriptions   Signed Prescriptions Disp Refills   Dulaglutide (TRULICITY) 1.5 MG/0.5ML SOAJ 2 mL 3    Sig: Inject 1.5 mg into the skin once a week.    Return in about 2 months (around 08/24/2023).  Jonah Blue, MD, FACP

## 2023-06-28 LAB — CULTURE, BLOOD (ROUTINE X 2)
Culture: NO GROWTH
Culture: NO GROWTH

## 2023-07-02 ENCOUNTER — Other Ambulatory Visit: Payer: Self-pay

## 2023-07-11 ENCOUNTER — Other Ambulatory Visit: Payer: Medicaid Other

## 2023-07-15 ENCOUNTER — Other Ambulatory Visit: Payer: Self-pay

## 2023-07-15 ENCOUNTER — Other Ambulatory Visit: Payer: Self-pay | Admitting: Internal Medicine

## 2023-07-16 ENCOUNTER — Other Ambulatory Visit: Payer: Self-pay

## 2023-07-16 ENCOUNTER — Other Ambulatory Visit: Payer: Self-pay | Admitting: Internal Medicine

## 2023-07-16 MED ORDER — APIXABAN 5 MG PO TABS
5.0000 mg | ORAL_TABLET | Freq: Two times a day (BID) | ORAL | 2 refills | Status: DC
  Filled 2023-07-16 – 2023-07-17 (×2): qty 60, 30d supply, fill #0
  Filled 2023-08-14: qty 60, 30d supply, fill #1

## 2023-07-17 ENCOUNTER — Other Ambulatory Visit (HOSPITAL_COMMUNITY): Payer: Self-pay

## 2023-07-17 ENCOUNTER — Other Ambulatory Visit: Payer: Self-pay

## 2023-07-29 ENCOUNTER — Other Ambulatory Visit: Payer: Self-pay | Admitting: Pharmacist

## 2023-07-29 ENCOUNTER — Other Ambulatory Visit: Payer: Self-pay

## 2023-07-29 ENCOUNTER — Ambulatory Visit
Admission: RE | Admit: 2023-07-29 | Discharge: 2023-07-29 | Disposition: A | Discharge status: Home / Self Care | Source: Ambulatory Visit | Attending: Internal Medicine | Admitting: Internal Medicine

## 2023-07-29 ENCOUNTER — Other Ambulatory Visit: Payer: Self-pay | Admitting: Internal Medicine

## 2023-07-29 DIAGNOSIS — I2699 Other pulmonary embolism without acute cor pulmonale: Secondary | ICD-10-CM

## 2023-07-29 DIAGNOSIS — Z09 Encounter for follow-up examination after completed treatment for conditions other than malignant neoplasm: Secondary | ICD-10-CM

## 2023-07-29 DIAGNOSIS — Z7985 Long-term (current) use of injectable non-insulin antidiabetic drugs: Secondary | ICD-10-CM

## 2023-07-29 DIAGNOSIS — M25552 Pain in left hip: Secondary | ICD-10-CM | POA: Diagnosis not present

## 2023-07-29 DIAGNOSIS — G8929 Other chronic pain: Secondary | ICD-10-CM

## 2023-07-29 DIAGNOSIS — M25551 Pain in right hip: Secondary | ICD-10-CM | POA: Diagnosis not present

## 2023-07-29 DIAGNOSIS — E1159 Type 2 diabetes mellitus with other circulatory complications: Secondary | ICD-10-CM

## 2023-07-29 MED ORDER — ACCU-CHEK SOFTCLIX LANCET DEV KIT
PACK | 0 refills | Status: DC
  Filled 2023-07-29: qty 1, 1d supply, fill #0
  Filled 2023-07-29: qty 1, 30d supply, fill #0
  Filled 2023-07-29: qty 1, 1d supply, fill #0
  Filled 2023-07-29: qty 1, 30d supply, fill #0

## 2023-07-30 ENCOUNTER — Other Ambulatory Visit: Payer: Self-pay

## 2023-08-04 ENCOUNTER — Encounter: Payer: Self-pay | Admitting: Family Medicine

## 2023-08-14 ENCOUNTER — Other Ambulatory Visit: Payer: Self-pay

## 2023-08-14 ENCOUNTER — Other Ambulatory Visit: Payer: Self-pay | Admitting: Internal Medicine

## 2023-08-14 ENCOUNTER — Encounter: Payer: Self-pay | Admitting: Urology

## 2023-08-14 DIAGNOSIS — E785 Hyperlipidemia, unspecified: Secondary | ICD-10-CM

## 2023-08-14 DIAGNOSIS — E1159 Type 2 diabetes mellitus with other circulatory complications: Secondary | ICD-10-CM

## 2023-08-14 MED ORDER — PRAVASTATIN SODIUM 40 MG PO TABS
40.0000 mg | ORAL_TABLET | Freq: Every day | ORAL | 0 refills | Status: DC
  Filled 2023-08-14: qty 30, 30d supply, fill #0

## 2023-08-14 MED ORDER — CARVEDILOL 12.5 MG PO TABS
12.5000 mg | ORAL_TABLET | Freq: Two times a day (BID) | ORAL | 0 refills | Status: DC
  Filled 2023-08-14: qty 60, 30d supply, fill #0

## 2023-08-17 ENCOUNTER — Ambulatory Visit
Admission: RE | Admit: 2023-08-17 | Discharge: 2023-08-17 | Disposition: A | Discharge status: Home / Self Care | Payer: Medicaid Other | Source: Ambulatory Visit | Attending: Urology

## 2023-08-17 DIAGNOSIS — R972 Elevated prostate specific antigen [PSA]: Secondary | ICD-10-CM

## 2023-08-17 MED ORDER — GADOPICLENOL 0.5 MMOL/ML IV SOLN
10.0000 mL | Freq: Once | INTRAVENOUS | Status: AC | PRN
  Administered 2023-08-17: 10 mL via INTRAVENOUS

## 2023-08-25 DIAGNOSIS — R972 Elevated prostate specific antigen [PSA]: Secondary | ICD-10-CM | POA: Diagnosis not present

## 2023-08-28 ENCOUNTER — Encounter: Payer: Self-pay | Admitting: Internal Medicine

## 2023-08-28 ENCOUNTER — Ambulatory Visit: Attending: Internal Medicine | Admitting: Internal Medicine

## 2023-08-28 ENCOUNTER — Other Ambulatory Visit: Payer: Self-pay

## 2023-08-28 ENCOUNTER — Ambulatory Visit: Payer: Medicaid Other | Admitting: Internal Medicine

## 2023-08-28 VITALS — BP 108/74 | HR 92 | Temp 98.0°F | Ht 67.0 in | Wt 299.0 lb

## 2023-08-28 DIAGNOSIS — Z7985 Long-term (current) use of injectable non-insulin antidiabetic drugs: Secondary | ICD-10-CM | POA: Diagnosis not present

## 2023-08-28 DIAGNOSIS — N402 Nodular prostate without lower urinary tract symptoms: Secondary | ICD-10-CM

## 2023-08-28 DIAGNOSIS — E785 Hyperlipidemia, unspecified: Secondary | ICD-10-CM | POA: Diagnosis not present

## 2023-08-28 DIAGNOSIS — E1169 Type 2 diabetes mellitus with other specified complication: Secondary | ICD-10-CM

## 2023-08-28 DIAGNOSIS — Z23 Encounter for immunization: Secondary | ICD-10-CM

## 2023-08-28 DIAGNOSIS — I10 Essential (primary) hypertension: Secondary | ICD-10-CM

## 2023-08-28 DIAGNOSIS — Z6841 Body Mass Index (BMI) 40.0 and over, adult: Secondary | ICD-10-CM | POA: Diagnosis not present

## 2023-08-28 DIAGNOSIS — I2699 Other pulmonary embolism without acute cor pulmonale: Secondary | ICD-10-CM | POA: Diagnosis not present

## 2023-08-28 DIAGNOSIS — I152 Hypertension secondary to endocrine disorders: Secondary | ICD-10-CM

## 2023-08-28 MED ORDER — CARVEDILOL 12.5 MG PO TABS
12.5000 mg | ORAL_TABLET | Freq: Two times a day (BID) | ORAL | 3 refills | Status: AC
Start: 2023-08-28 — End: ?
  Filled 2023-08-28 – 2023-09-14 (×2): qty 180, 90d supply, fill #0
  Filled 2023-12-07: qty 60, 30d supply, fill #1
  Filled 2024-01-26 (×2): qty 60, 30d supply, fill #2
  Filled 2024-02-26: qty 60, 30d supply, fill #3
  Filled 2024-03-25: qty 60, 30d supply, fill #4
  Filled 2024-05-02: qty 60, 30d supply, fill #5
  Filled 2024-06-02: qty 60, 30d supply, fill #6

## 2023-08-28 MED ORDER — LISINOPRIL 40 MG PO TABS
40.0000 mg | ORAL_TABLET | Freq: Every day | ORAL | 3 refills | Status: AC
  Filled 2023-08-28: qty 90, 90d supply, fill #0
  Filled 2023-10-28 – 2023-11-09 (×2): qty 90, 90d supply, fill #1
  Filled 2024-02-26: qty 90, 90d supply, fill #2
  Filled 2024-06-02: qty 90, 90d supply, fill #3

## 2023-08-28 MED ORDER — HYDROCHLOROTHIAZIDE 25 MG PO TABS
25.0000 mg | ORAL_TABLET | Freq: Every day | ORAL | 2 refills | Status: DC
  Filled 2023-08-28: qty 90, 90d supply, fill #0
  Filled 2023-10-28 – 2023-11-09 (×2): qty 90, 90d supply, fill #1
  Filled 2024-02-26: qty 90, 90d supply, fill #2

## 2023-08-28 MED ORDER — AMLODIPINE BESYLATE 10 MG PO TABS
10.0000 mg | ORAL_TABLET | Freq: Every day | ORAL | 1 refills | Status: DC
  Filled 2023-08-28: qty 90, 90d supply, fill #0
  Filled 2023-10-28 – 2023-11-09 (×2): qty 90, 90d supply, fill #1

## 2023-08-28 MED ORDER — PRAVASTATIN SODIUM 40 MG PO TABS
40.0000 mg | ORAL_TABLET | Freq: Every day | ORAL | 3 refills | Status: AC
Start: 2023-08-28 — End: ?
  Filled 2023-08-28 – 2023-09-14 (×2): qty 90, 90d supply, fill #0
  Filled 2023-12-07: qty 30, 30d supply, fill #1
  Filled 2024-01-26: qty 30, 30d supply, fill #2
  Filled 2024-02-26: qty 30, 30d supply, fill #3
  Filled 2024-03-25: qty 30, 30d supply, fill #4
  Filled 2024-05-02: qty 30, 30d supply, fill #5
  Filled 2024-06-02: qty 30, 30d supply, fill #6

## 2023-08-28 MED ORDER — APIXABAN 5 MG PO TABS
5.0000 mg | ORAL_TABLET | Freq: Two times a day (BID) | ORAL | 2 refills | Status: DC
  Filled 2023-08-28 – 2023-09-11 (×2): qty 60, 30d supply, fill #0
  Filled 2023-10-28: qty 60, 30d supply, fill #1
  Filled 2023-12-07 (×2): qty 60, 30d supply, fill #2

## 2023-08-28 MED ORDER — TRULICITY 3 MG/0.5ML ~~LOC~~ SOAJ
3.0000 mg | SUBCUTANEOUS | 2 refills | Status: DC
  Filled 2023-08-28: qty 2, 28d supply, fill #0
  Filled 2023-09-23: qty 2, 28d supply, fill #1
  Filled 2023-10-28 – 2023-12-07 (×2): qty 2, 28d supply, fill #2

## 2023-08-28 NOTE — Progress Notes (Signed)
 Patient ID: Scott Mcmillan, male    DOB: Dec 18, 1960  MRN: [id number]  CC: Hypertension (HTN f/u. Alois Arnt hydrochlorothiazide , amlodipine , lisinopril  - currently not on med list /Yes to pneumonia vax)   Subjective: Scott Mcmillan is a 63 y.o. male who presents for 2 mth f/u BP and obesity. His concerns today include:  Patient with history of HTN, HL, morbid obesity, DM, submassive BL PE.   HTN: On last visit 2 months ago, patient was seen posthospitalization for bilateral submassive PE.  Blood pressure was low.  Patient was advised to continue to hold amlodipine , HCTZ and lisinopril .  He was continued on carvedilol .  Advised to restart amlodipine  if blood pressure started rising above 130/80. -reports after a few wks he had to restart all the other meds because BP was increasing. Checks BP 1-3x/day.  Recent readings 120s/70-80  Hx of PE:  No bruising or bleeding on Eliquis . Plan for 3-6 mths; more toward 6. This will take him to August. Had Prostate MRI done recently through his urology Dr. Chloe Counter; showed 3 lesions.  His urologist wanted to know when Eliquis  can be held for bx but states it can wait until he completes the course of Eliquis .  Morbid obesity/DM: For his obesity, on last visit patient was down 14 pounds after being on the Trulicity  for 2 months.  We increased the dose to 1.5 mg once a week. -He is up 6 pounds since last visit 2 months ago. Reports he is eating more since surgery 06/2023 Checking BS TID before meals. Before BF 120-140, afternoon 116-155  Needs RF on Pravastatin .   Patient Active Problem List   Diagnosis Date Noted   Prostate nodule 08/28/2023   Pulmonary embolism (HCC) 06/23/2023   Leg swelling 06/23/2023   Type 2 diabetes mellitus without complications (HCC) 06/23/2023   Abnormal chest x-ray 06/23/2023   AKI (acute kidney injury) (HCC) 06/23/2023   Hypoxia 06/23/2023   Hyperlipidemia associated with type 2 diabetes mellitus (HCC) 11/08/2019    Influenza vaccine needed 01/13/2019   Non-recurrent bilateral inguinal hernia without obstruction or gangrene 04/23/2018   Hemorrhoids 01/22/2018   Actinic keratosis due to exposure to sunlight 07/21/2017   Lower extremity edema 07/21/2017   Class 3 severe obesity due to excess calories without serious comorbidity with body mass index (BMI) of 50.0 to 59.9 in adult (HCC) 07/21/2017   Hyperlipidemia with target low density lipoprotein (LDL) cholesterol less than 100 mg/dL 40/98/1191   Hypertension 10/31/2016     Current Outpatient Medications on File Prior to Visit  Medication Sig Dispense Refill   Accu-Chek Softclix Lancets lancets Use to check blood sugar 3 times daily. 100 each 6   Blood Glucose Monitoring Suppl (ACCU-CHEK GUIDE) w/Device KIT Use to check blood sugar 3 times daily. 1 kit 0   Docusate Sodium  (STOOL SOFTENER LAXATIVE PO) Take 100 mg by mouth. Takes 2 to 4 capsules daily as needed.     Dulaglutide  (TRULICITY ) 1.5 MG/0.5ML SOAJ Inject 1.5 mg into the skin once a week. 2 mL 3   glucose blood (ACCU-CHEK GUIDE TEST) test strip Use to check blood sugar 3 times daily. 100 each 6   Hydrocortisone  (PREPARATION H EX) Apply 1 Application topically daily as needed (hemmorhoids).     Lancets Misc. (ACCU-CHEK SOFTCLIX LANCET DEV) KIT Use to check blood sugar. 1 kit 0   polyethylene glycol powder (GLYCOLAX /MIRALAX ) 17 GM/SCOOP powder Mix as directed and take 1 capful (17 g) with water by mouth daily as needed  for mild constipation. 238 g 0   No current facility-administered medications on file prior to visit.    No Known Allergies  Social History   Socioeconomic History   Marital status: Single    Spouse name: Not on file   Number of children: Not on file   Years of education: Not on file   Highest education level: Not on file  Occupational History   Not on file  Tobacco Use   Smoking status: Never   Smokeless tobacco: Never  Vaping Use   Vaping status: Never Used   Substance and Sexual Activity   Alcohol use: No   Drug use: No   Sexual activity: Not Currently  Other Topics Concern   Not on file  Social History Narrative   Not on file   Social Drivers of Health   Financial Resource Strain: Medium Risk (04/20/2023)   Overall Financial Resource Strain (CARDIA)    Difficulty of Paying Living Expenses: Somewhat hard  Food Insecurity: No Food Insecurity (06/24/2023)   Hunger Vital Sign    Worried About Running Out of Food in the Last Year: Never true    Ran Out of Food in the Last Year: Never true  Transportation Needs: No Transportation Needs (06/24/2023)   PRAPARE - Administrator, Civil Service (Medical): No    Lack of Transportation (Non-Medical): No  Physical Activity: Inactive (04/20/2023)   Exercise Vital Sign    Days of Exercise per Week: 0 days    Minutes of Exercise per Session: 0 min  Stress: No Stress Concern Present (04/20/2023)   [employer] of Occupational Health - Occupational Stress Questionnaire    Feeling of Stress : Not at all  Social Connections: Moderately Isolated (04/20/2023)   Social Connection and Isolation Panel [NHANES]    Frequency of Communication with Friends and Family: More than three times a week    Frequency of Social Gatherings with Friends and Family: Once a week    Attends Religious Services: Never    Database administrator or Organizations: No    Attends Banker Meetings: 1 to 4 times per year    Marital Status: Never married  Intimate Partner Violence: Not At Risk (06/24/2023)   Humiliation, Afraid, Rape, and Kick questionnaire    Fear of Current or Ex-Partner: No    Emotionally Abused: No    Physically Abused: No    Sexually Abused: No    Family History  Problem Relation Age of Onset   Heart disease Mother    Lung cancer Father    Colon cancer Neg Hx    Esophageal cancer Neg Hx    Liver cancer Neg Hx    Pancreatic cancer Neg Hx    Rectal cancer Neg Hx     Stomach cancer Neg Hx     Past Surgical History:  Procedure Laterality Date   COLONOSCOPY  05/2017   FISTULOTOMY N/A 12/12/2021   Procedure: SETON PLACEMENT;  Surgeon: Joyce Nixon, MD;  Location: [facility];  Service: General;  Laterality: N/A;   LIGATION OF INTERNAL FISTULA TRACT N/A 05/22/2022   Procedure: LIGATION OF INTERNAL FISTULA TRACT;  Surgeon: Joyce Nixon, MD;  Location: [facility];  Service: General;  Laterality: N/A;    ROS: Review of Systems Negative except as stated above  PHYSICAL EXAM: BP 108/74 (BP Location: Left Arm, Patient Position: Sitting, Cuff Size: Large)   Pulse 92   Temp 98 F (36.7 C) (Oral)  Ht 5\' 7"  (1.702 m)   Wt 299 lb (135.6 kg)   SpO2 95%   BMI 46.83 kg/m   Wt Readings from Last 3 Encounters:  08/28/23 299 lb (135.6 kg)  06/26/23 295 lb (133.8 kg)  06/24/23 293 lb 3.2 oz (133 kg)    Physical Exam   General appearance - alert, well appearing, morbidly obese older Caucasian male and in no distress Mental status - normal mood, behavior, speech, dress, motor activity, and thought processes Chest - clear to auscultation, no wheezes, rales or rhonchi, symmetric air entry Heart - normal rate, regular rhythm, normal S1, S2, no murmurs, rubs, clicks or gallops Extremities - peripheral pulses normal, no pedal edema, no clubbing or cyanosis Diabetic Foot Exam - Simple   Simple Foot Form Diabetic Foot exam was performed with the following findings: Yes 08/28/2023  2:30 PM  Visual Inspection See comments: Yes Sensation Testing Intact to touch and monofilament testing bilaterally: Yes Pulse Check Posterior Tibialis and Dorsalis pulse intact bilaterally: Yes Comments Toenails over grown. Skin dry.        Latest Ref Rng & Units 06/25/2023    3:24 AM 06/24/2023    4:55 AM 06/23/2023   12:04 PM  CMP  Glucose 70 - 99 mg/dL 161  096  045   BUN 8 - 23 mg/dL 18  19  17    Creatinine 0.61 - 1.24 mg/dL 4.09  8.11   9.14   Sodium 135 - 145 mmol/L 136  133  133   Potassium 3.5 - 5.1 mmol/L 3.8  3.0  3.5   Chloride 98 - 111 mmol/L 103  97  96   CO2 22 - 32 mmol/L 21  22  21    Calcium 8.9 - 10.3 mg/dL 9.3  8.8  9.4    Lipid Panel     Component Value Date/Time   CHOL 145 12/19/2022 1634   TRIG 144 12/19/2022 1634   HDL 37 (L) 12/19/2022 1634   CHOLHDL 3.9 12/19/2022 1634   LDLCALC 83 12/19/2022 1634    CBC    Component Value Date/Time   WBC 10.9 (H) 06/25/2023 0324   RBC 4.95 06/25/2023 0324   HGB 15.3 06/25/2023 0324   HGB 15.5 12/19/2022 1634   HCT 44.6 06/25/2023 0324   HCT 46.1 12/19/2022 1634   PLT 194 06/25/2023 0324   PLT 226 12/19/2022 1634   MCV 90.1 06/25/2023 0324   MCV 90 12/19/2022 1634   MCH 30.9 06/25/2023 0324   MCHC 34.3 06/25/2023 0324   RDW 12.9 06/25/2023 0324   RDW 12.8 12/19/2022 1634    ASSESSMENT AND PLAN: 1. Hypertension associated with diabetes (HCC) (Primary) At goal.  Patient had to restart all of his antihypertensives since last visit with me.  Refills sent on carvedilol , amlodipine , lisinopril  and hydrochlorothiazide .  Continue to monitor blood pressure at home with goal being 130/80 or lower - carvedilol  (COREG ) 12.5 MG tablet; Take 1 tablet (12.5 mg total) by mouth 2 (two) times daily with a meal.  Dispense: 180 tablet; Refill: 3 - amLODipine  (NORVASC ) 10 MG tablet; Take 1 tablet (10 mg total) by mouth daily.  Dispense: 90 tablet; Refill: 1 - lisinopril  (ZESTRIL ) 40 MG tablet; Take 1 tablet (40 mg total) by mouth daily.  Dispense: 90 tablet; Refill: 3 - hydrochlorothiazide  (HYDRODIURIL ) 25 MG tablet; Take 1 tablet (25 mg total) by mouth daily.  Dispense: 90 tablet; Refill: 2  2. Type 2 diabetes mellitus with morbid obesity (HCC) Blood sugar readings  are good.  However we discussed increasing the Trulicity  to 3 mg once a week to help with further weight loss efforts.  Advised to watch out for potential side effects with the increased dose.  Stop the  medicine and call me if he develops any vomiting, abdominal pain, severe diarrhea/constipation.  Discussed and encourage healthy eating habits. - Dulaglutide  (TRULICITY ) 3 MG/0.5ML SOAJ; Inject 3 mg as directed once a week. Start after completing the current box of 1.5 mg pens  Dispense: 2 mL; Refill: 2  3. Hyperlipidemia, unspecified hyperlipidemia type - pravastatin  (PRAVACHOL ) 40 MG tablet; Take 1 tablet (40 mg total) by mouth daily.  Dispense: 90 tablet; Refill: 3  4. Prostate nodules Plan for prostate biopsy.  Advised that he would need to complete at minimum 3 months of the Eliquis  before pausing therapy.  But even so, after biopsy he may have bleeding/hematuria.  He thinks that both he and the urologist are willing to wait for 6 months out.  5. Pulmonary embolism, bilateral (HCC) See #4 above. - apixaban  (ELIQUIS ) 5 MG TABS tablet; Take 1 tablet (5 mg total) by mouth 2 (two) times daily. Start this once he completes the starter pack  Dispense: 60 tablet; Refill: 2  6. Need for vaccination against Streptococcus pneumoniae - Pneumococcal conjugate vaccine 20-valent   Patient was given the opportunity to ask questions.  Patient verbalized understanding of the plan and was able to repeat key elements of the plan.   This documentation was completed using Paediatric nurse.  Any transcriptional errors are unintentional.  Orders Placed This Encounter  Procedures   Pneumococcal conjugate vaccine 20-valent     Requested Prescriptions   Signed Prescriptions Disp Refills   pravastatin  (PRAVACHOL ) 40 MG tablet 90 tablet 3    Sig: Take 1 tablet (40 mg total) by mouth daily.   carvedilol  (COREG ) 12.5 MG tablet 180 tablet 3    Sig: Take 1 tablet (12.5 mg total) by mouth 2 (two) times daily with a meal.   apixaban  (ELIQUIS ) 5 MG TABS tablet 60 tablet 2    Sig: Take 1 tablet (5 mg total) by mouth 2 (two) times daily. Start this once he completes the starter pack    amLODipine  (NORVASC ) 10 MG tablet 90 tablet 1    Sig: Take 1 tablet (10 mg total) by mouth daily.   lisinopril  (ZESTRIL ) 40 MG tablet 90 tablet 3    Sig: Take 1 tablet (40 mg total) by mouth daily.   hydrochlorothiazide  (HYDRODIURIL ) 25 MG tablet 90 tablet 2    Sig: Take 1 tablet (25 mg total) by mouth daily.   Dulaglutide  (TRULICITY ) 3 MG/0.5ML SOAJ 2 mL 2    Sig: Inject 3 mg as directed once a week. Start after completing the current box of 1.5 mg pens    Return in about 4 months (around 12/28/2023).  Concetta Dee, MD, FACP

## 2023-08-28 NOTE — Patient Instructions (Signed)
 I would advise that we wait until you have completed at least 4 to 6 months on Eliquis  before having prostate biopsy done.  Increase Trulicity  to 3 mg once a week.  Please stop the medicine and come in to be seen if you develop any vomiting, abdominal pain, severe diarrhea/constipation.

## 2023-08-31 ENCOUNTER — Other Ambulatory Visit: Payer: Self-pay

## 2023-09-11 ENCOUNTER — Other Ambulatory Visit: Payer: Self-pay

## 2023-09-14 ENCOUNTER — Other Ambulatory Visit: Payer: Self-pay

## 2023-09-22 ENCOUNTER — Other Ambulatory Visit: Payer: Self-pay

## 2023-09-23 ENCOUNTER — Other Ambulatory Visit: Payer: Self-pay

## 2023-09-23 ENCOUNTER — Other Ambulatory Visit (HOSPITAL_COMMUNITY): Payer: Self-pay

## 2023-10-28 ENCOUNTER — Other Ambulatory Visit: Payer: Self-pay

## 2023-10-29 ENCOUNTER — Other Ambulatory Visit: Payer: Self-pay

## 2023-11-09 ENCOUNTER — Other Ambulatory Visit: Payer: Self-pay

## 2023-12-07 ENCOUNTER — Other Ambulatory Visit: Payer: Self-pay

## 2023-12-07 ENCOUNTER — Other Ambulatory Visit: Payer: Self-pay | Admitting: Internal Medicine

## 2023-12-07 ENCOUNTER — Telehealth: Payer: Self-pay

## 2023-12-07 DIAGNOSIS — E1169 Type 2 diabetes mellitus with other specified complication: Secondary | ICD-10-CM

## 2023-12-07 DIAGNOSIS — E1159 Type 2 diabetes mellitus with other circulatory complications: Secondary | ICD-10-CM

## 2023-12-07 NOTE — Telephone Encounter (Signed)
 Noted! Thank you

## 2023-12-07 NOTE — Telephone Encounter (Signed)
 Patient came stating he no longer has medicaid and he's trying to get on Medicare but its taking more than 3 months. Patient states he is running out of apixaban  (ELIQUIS ) 5 MG and Dulaglutide  (TRULICITY ) 3 MG but he is wanting to switch from Trulicity  to Metformin  because of the cost. Patient also requesting more information about Patient Assistance application for the pharmacy.

## 2023-12-07 NOTE — Telephone Encounter (Signed)
 Patient came to the office, and stated he went to the pharmacy and was able to get refills for his medications, He reports he longer needs assistance at this time.

## 2023-12-08 NOTE — Telephone Encounter (Signed)
 Requested Prescriptions  Refused Prescriptions Disp Refills   metFORMIN  (GLUCOPHAGE -XR) 500 MG 24 hr tablet 180 tablet 1    Sig: Take 1 tablet (500 mg total) by mouth 2 (two) times daily.     Endocrinology:  Diabetes - Biguanides Failed - 12/08/2023  1:06 PM      Failed - B12 Level in normal range and within 720 days    No results found for: VITAMINB12       Failed - CBC within normal limits and completed in the last 12 months    WBC  Date Value Ref Range Status  06/25/2023 10.9 (H) 4.0 - 10.5 K/uL Final   RBC  Date Value Ref Range Status  06/25/2023 4.95 4.22 - 5.81 MIL/uL Final   Hemoglobin  Date Value Ref Range Status  06/25/2023 15.3 13.0 - 17.0 g/dL Final  91/83/7975 84.4 13.0 - 17.7 g/dL Final   HCT  Date Value Ref Range Status  06/25/2023 44.6 39.0 - 52.0 % Final   Hematocrit  Date Value Ref Range Status  12/19/2022 46.1 37.5 - 51.0 % Final   MCHC  Date Value Ref Range Status  06/25/2023 34.3 30.0 - 36.0 g/dL Final   [facility]  Date Value Ref Range Status  06/25/2023 30.9 26.0 - 34.0 pg Final   MCV  Date Value Ref Range Status  06/25/2023 90.1 80.0 - 100.0 fL Final  12/19/2022 90 79 - 97 fL Final   No results found for: PLTCOUNTKUC, LABPLAT, POCPLA RDW  Date Value Ref Range Status  06/25/2023 12.9 11.5 - 15.5 % Final  12/19/2022 12.8 11.6 - 15.4 % Final         Passed - Cr in normal range and within 360 days    Creatinine, Ser  Date Value Ref Range Status  06/25/2023 1.10 0.61 - 1.24 mg/dL Final   Creatinine, POC  Date Value Ref Range Status  10/31/2016 100 mg/dL Final         Passed - HBA1C is between 0 and 7.9 and within 180 days    HbA1c, POC (prediabetic range)  Date Value Ref Range Status  01/13/2019 6.4 5.7 - 6.4 % Final   HbA1c, POC (controlled diabetic range)  Date Value Ref Range Status  04/20/2023 8.3 (A) 0.0 - 7.0 % Final   Hgb A1c MFr Bld  Date Value Ref Range Status  06/24/2023 6.9 (H) 4.8 - 5.6 % Final    Comment:     (NOTE) Pre diabetes:          5.7%-6.4%  Diabetes:              >6.4%  Glycemic control for   <7.0% adults with diabetes          Passed - eGFR in normal range and within 360 days    GFR calc Af Amer  Date Value Ref Range Status  11/11/2019 96 >59 mL/min/1.73 Final    Comment:    **[facility] currently reports eGFR in compliance with the current**   recommendations of the [employer]. [facility] will   update reporting as new guidelines are published from the NKF-ASN   Task force.    GFR, Estimated  Date Value Ref Range Status  06/25/2023 >60 >60 mL/min Final    Comment:    (NOTE) Calculated using the CKD-EPI Creatinine Equation (2021)    eGFR  Date Value Ref Range Status  12/19/2022 77 >59 mL/min/1.73 Final         Passed -  Valid encounter within last 6 months    Recent Outpatient Visits           3 months ago Hypertension associated with diabetes [facility])   [place] [facility] Dept Of [place]. [facility] Barnie NOVAK, MD   5 months ago Hospital discharge follow-up   [facility] Of [place]. [facility] Vic[facility] Barnie B, MD   7 months ago Type 2 diabetes mellitus with morbid obesity [facility])   [place] [facility] Dept Of [place]. [facility] Barnie B, MD   11 months ago Type 2 diabetes mellitus with morbid obesity [facility])   [place] [facility] - [facility] Dept Of [place]. [facility] Barnie B, MD   1 year ago Type 2 diabetes mellitus with morbid obesity [facility])   [place] [facility] - [facility] Dept Of [place]. [facility] Vic[facility] Barnie B, MD               amLODipine  (NORVASC ) 10 MG tablet 90 tablet 1    Sig: Take 1 tablet (10 mg total) by mouth daily.     Cardiovascular: Calcium Channel Blockers 2 Passed - 12/08/2023  1:06 PM      Passed - Last BP in normal range    BP Readings from Last 1 Encounters:   08/28/23 108/74         Passed - Last Heart Rate in normal range    Pulse Readings from Last 1 Encounters:  08/28/23 92         Passed - Valid encounter within last 6 months    Recent Outpatient Visits           3 months ago Hypertension associated with diabetes (HCC)   [place] [facility] Dept Of [place]. [facility] Barnie NOVAK, MD   5 months ago Hospital discharge follow-up   [facility] - A Dept Of [place]. [facility] Barnie B, MD   7 months ago Type 2 diabetes mellitus with morbid obesity [facility])   [place] [facility] Dept Of [place]. [facility] Barnie B, MD   11 months ago Type 2 diabetes mellitus with morbid obesity [facility])    [facility] - [facility] Dept Of [place]. [facility] Barnie B, MD   1 year ago Type 2 diabetes mellitus with morbid obesity [facility])    [facility] - [facility] Dept Of [place]. [facility] Barnie NOVAK, MD

## 2023-12-09 ENCOUNTER — Other Ambulatory Visit: Payer: Self-pay

## 2023-12-25 ENCOUNTER — Telehealth: Payer: Self-pay | Admitting: Internal Medicine

## 2023-12-25 NOTE — Telephone Encounter (Signed)
 Confirmed appt for 8/25

## 2023-12-28 ENCOUNTER — Encounter: Payer: Self-pay | Admitting: Internal Medicine

## 2023-12-28 ENCOUNTER — Ambulatory Visit: Payer: Self-pay | Attending: Internal Medicine | Admitting: Internal Medicine

## 2023-12-28 ENCOUNTER — Other Ambulatory Visit: Payer: Self-pay

## 2023-12-28 DIAGNOSIS — E785 Hyperlipidemia, unspecified: Secondary | ICD-10-CM

## 2023-12-28 DIAGNOSIS — E1159 Type 2 diabetes mellitus with other circulatory complications: Secondary | ICD-10-CM | POA: Diagnosis not present

## 2023-12-28 DIAGNOSIS — Z86711 Personal history of pulmonary embolism: Secondary | ICD-10-CM | POA: Diagnosis not present

## 2023-12-28 DIAGNOSIS — I152 Hypertension secondary to endocrine disorders: Secondary | ICD-10-CM | POA: Insufficient documentation

## 2023-12-28 DIAGNOSIS — N402 Nodular prostate without lower urinary tract symptoms: Secondary | ICD-10-CM

## 2023-12-28 DIAGNOSIS — Z6841 Body Mass Index (BMI) 40.0 and over, adult: Secondary | ICD-10-CM | POA: Diagnosis not present

## 2023-12-28 DIAGNOSIS — Z8249 Family history of ischemic heart disease and other diseases of the circulatory system: Secondary | ICD-10-CM | POA: Insufficient documentation

## 2023-12-28 DIAGNOSIS — Z79899 Other long term (current) drug therapy: Secondary | ICD-10-CM | POA: Insufficient documentation

## 2023-12-28 DIAGNOSIS — E1169 Type 2 diabetes mellitus with other specified complication: Secondary | ICD-10-CM | POA: Diagnosis present

## 2023-12-28 DIAGNOSIS — Z7901 Long term (current) use of anticoagulants: Secondary | ICD-10-CM | POA: Diagnosis not present

## 2023-12-28 DIAGNOSIS — Z5986 Financial insecurity: Secondary | ICD-10-CM | POA: Diagnosis not present

## 2023-12-28 DIAGNOSIS — Z7985 Long-term (current) use of injectable non-insulin antidiabetic drugs: Secondary | ICD-10-CM

## 2023-12-28 DIAGNOSIS — I10 Essential (primary) hypertension: Secondary | ICD-10-CM

## 2023-12-28 LAB — POCT GLYCOSYLATED HEMOGLOBIN (HGB A1C): HbA1c, POC (controlled diabetic range): 6.1 % (ref 0.0–7.0)

## 2023-12-28 LAB — GLUCOSE, POCT (MANUAL RESULT ENTRY): POC Glucose: 126 mg/dL — AB (ref 70–99)

## 2023-12-28 MED ORDER — ACCU-CHEK GUIDE TEST VI STRP
ORAL_STRIP | 6 refills | Status: AC
  Filled 2023-12-28 (×2): qty 100, 33d supply, fill #0

## 2023-12-28 MED ORDER — OZEMPIC (0.25 OR 0.5 MG/DOSE) 2 MG/3ML ~~LOC~~ SOPN
0.5000 mg | PEN_INJECTOR | SUBCUTANEOUS | 1 refills | Status: DC
  Filled 2023-12-28: qty 3, 28d supply, fill #0
  Filled 2024-01-26: qty 3, 28d supply, fill #1

## 2023-12-28 MED ORDER — AMLODIPINE BESYLATE 10 MG PO TABS
10.0000 mg | ORAL_TABLET | Freq: Every day | ORAL | 3 refills | Status: AC
  Filled 2023-12-28 – 2024-02-26 (×2): qty 90, 90d supply, fill #0
  Filled 2024-06-02: qty 90, 90d supply, fill #1

## 2023-12-28 MED ORDER — ACCU-CHEK SOFTCLIX LANCET DEV KIT
PACK | 0 refills | Status: AC
  Filled 2023-12-28: qty 1, fill #0

## 2023-12-28 NOTE — Patient Instructions (Signed)
 VISIT SUMMARY:  Scott Mcmillan, you had a follow-up visit today to review your diabetes, hypertension, and other health concerns. Your blood sugar levels have improved, and your A1c is now 6.1%. We discussed changes to your diabetes medication and reviewed your current health status, including your recent weight loss and physical activity.  YOUR PLAN:  -TYPE 2 DIABETES MELLITUS: Type 2 diabetes is a condition where your body does not use insulin  properly, leading to high blood sugar levels. Your A1c has improved to 6.1%, but we will switch your medication to Ozempic  0.5 mg once weekly to help with appetite control and weight loss. Please have the pharmacy show you how to use the Ozempic  pen. Monitor for side effects like vomiting, abdominal pain, severe diarrhea, and heart racing. If tolerated, we will increase the dose to 1 mg after one month.  -OBESITY: Obesity is a condition where excess body fat may affect your health. You have lost 5 pounds recently. Continue to increase your physical activity, such as walking several times a week.  -ESSENTIAL HYPERTENSION: Essential hypertension is high blood pressure with no identifiable cause. Your blood pressure is well-controlled with your current medications.  -HYPERLIPIDEMIA: Hyperlipidemia is having high levels of fats in your blood. You are managing this with pravastatin . You are due for cholesterol and liver function tests, which we will order.  -HISTORY OF VENOUS THROMBOEMBOLISM: Venous thromboembolism is a condition where blood clots form in the veins. You have completed your anticoagulation treatment. It is important to stay physically active to reduce the risk of recurrence. We will discontinue Eliquis .  -PROSTATE NODULE: A prostate nodule is a small lump on the prostate gland. You have a biopsy scheduled with your urologist to assess this further. 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  -LOWER EXTREMITY EDEMA: Lower extremity edema is swelling in the  lower legs. You have had trace swelling over the past year.  -GENERAL HEALTH MAINTENANCE: Once your insurance is active, please schedule a diabetic eye exam to check for any complications related to diabetes.  INSTRUCTIONS:  Please follow up with your urologist for your prostate biopsy on October 4th and 21st.

## 2023-12-28 NOTE — Progress Notes (Signed)
 Patient ID: Scott Mcmillan, male    DOB: January 01, 1961  MRN: [id number]  CC: Diabetes (DM f/u. Med refills. /No questions / concerns)   Subjective: Scott Mcmillan is a 63 y.o. male who presents for chronic ds management. His concerns today include:  Patient with history of HTN, HL, morbid obesity, DM, submassive BL PE.   Discussed the use of AI scribe software for clinical note transcription with the patient, who gave verbal consent to proceed.  History of Present Illness Scott Mcmillan is a 63 year old male with diabetes and hypertension who presents for a four-month follow-up visit.  DM: Results for orders placed or performed in visit on 12/28/23  POCT glucose (manual entry)   Collection Time: 12/28/23  3:12 PM  Result Value Ref Range   POC Glucose 126 (A) 70 - 99 mg/dl  POCT glycosylated hemoglobin (Hb A1C)   Collection Time: 12/28/23  3:19 PM  Result Value Ref Range   Hemoglobin A1C     HbA1c POC (<> result, manual entry)     HbA1c, POC (prediabetic range)     HbA1c, POC (controlled diabetic range) 6.1 0.0 - 7.0 %   His blood sugar levels have not decreased as expected despite an increase in Trulicity  to 3 mg once a week. Morning blood sugar levels, which were in the 90s a year ago, are now typically in the 120s, varying with diet. He has been checking his blood sugar less frequently over the past two months due to having relatives stay with him, but continues to check it in the mornings. His A1c has improved from 6.9% in February to 6.1% currently. He has experienced a weight loss of five pounds since the increase in Trulicity , although he sometimes feels hungrier and occasionally overeats. He initially lost twelve pounds after starting Trulicity , partly due to reduced appetite following abdominal surgery.  He has not been very active since his abdominal surgery, which has affected his ability to walk and exercise. Recently, he has started walking more, visiting parks like [place]. He is currently on Eliquis , which he has been taking for six months following a blood clot after his abdominal surgery.  Prostate nodule: He is currently without insurance as his expanded Medicaid ended in June, and he is awaiting paperwork for Medicare. He plans to see a urologist in October for a prostate biopsy, which was scheduled due to concerns about taking Eliquis  and a low-grade prostate nodule.  HTN: He continues to take carvedilol , amlodipine , lisinopril , and hydrochlorothiazide  for blood pressure management, and pravastatin  40 mg for cholesterol. He limits his salt intake and does not use table salt. No shortness of breath or chest pain. He reports occasional diarrhea with metformin  in the past, but not with Trulicity .  HM: Due for diabetic eye exam.  He plans to get this done once he gets Medicare.  He will be due for flu vaccine this fall.    Patient Active Problem List   Diagnosis Date Noted   Prostate nodule 08/28/2023   Pulmonary embolism (HCC) 06/23/2023   Leg swelling 06/23/2023   Type 2 diabetes mellitus without complications (HCC) 06/23/2023   Abnormal chest x-ray 06/23/2023   AKI (acute kidney injury) (HCC) 06/23/2023   Hypoxia 06/23/2023   Hyperlipidemia associated with type 2 diabetes mellitus (HCC) 11/08/2019   Influenza vaccine needed 01/13/2019   Non-recurrent bilateral inguinal hernia without obstruction or gangrene 04/23/2018   Hemorrhoids 01/22/2018   Actinic keratosis due to exposure to sunlight 07/21/2017  Lower extremity edema 07/21/2017   Class 3 severe obesity due to excess calories without serious comorbidity with body mass index (BMI) of 50.0 to 59.9 in adult 07/21/2017   Hyperlipidemia with target low density lipoprotein (LDL) cholesterol less than 100 mg/dL 92/83/7981   Hypertension 10/31/2016     Current Outpatient Medications on File Prior to Visit  Medication Sig Dispense Refill   Accu-Chek Softclix Lancets lancets Use to check blood  sugar 3 times daily. 100 each 6   Blood Glucose Monitoring Suppl (ACCU-CHEK GUIDE) w/Device KIT Use to check blood sugar 3 times daily. 1 kit 0   carvedilol  (COREG ) 12.5 MG tablet Take 1 tablet (12.5 mg total) by mouth 2 (two) times daily with a meal. 180 tablet 3   hydrochlorothiazide  (HYDRODIURIL ) 25 MG tablet Take 1 tablet (25 mg total) by mouth daily. 90 tablet 2   Hydrocortisone  (PREPARATION H EX) Apply 1 Application topically daily as needed (hemmorhoids).     lisinopril  (ZESTRIL ) 40 MG tablet Take 1 tablet (40 mg total) by mouth daily. 90 tablet 3   polyethylene glycol powder (GLYCOLAX /MIRALAX ) 17 GM/SCOOP powder Mix as directed and take 1 capful (17 g) with water by mouth daily as needed for mild constipation. 238 g 0   pravastatin  (PRAVACHOL ) 40 MG tablet Take 1 tablet (40 mg total) by mouth daily. 90 tablet 3   Docusate Sodium  (STOOL SOFTENER LAXATIVE PO) Take 100 mg by mouth. Takes 2 to 4 capsules daily as needed. (Patient not taking: Reported on 12/28/2023)     No current facility-administered medications on file prior to visit.    No Known Allergies  Social History   Socioeconomic History   Marital status: Single    Spouse name: Not on file   Number of children: Not on file   Years of education: Not on file   Highest education level: Not on file  Occupational History   Not on file  Tobacco Use   Smoking status: Never   Smokeless tobacco: Never  Vaping Use   Vaping status: Never Used  Substance and Sexual Activity   Alcohol use: No   Drug use: No   Sexual activity: Not Currently  Other Topics Concern   Not on file  Social History Narrative   Not on file   Social Drivers of Health   Financial Resource Strain: Medium Risk (04/20/2023)   Overall Financial Resource Strain (CARDIA)    Difficulty of Paying Living Expenses: Somewhat hard  Food Insecurity: No Food Insecurity (06/24/2023)   Hunger Vital Sign    Worried About Running Out of Food in the Last Year: Never  true    Ran Out of Food in the Last Year: Never true  Transportation Needs: No Transportation Needs (06/24/2023)   PRAPARE - Administrator, Civil Service (Medical): No    Lack of Transportation (Non-Medical): No  Physical Activity: Inactive (04/20/2023)   Exercise Vital Sign    Days of Exercise per Week: 0 days    Minutes of Exercise per Session: 0 min  Stress: No Stress Concern Present (04/20/2023)   [employer] of Occupational Health - Occupational Stress Questionnaire    Feeling of Stress : Not at all  Social Connections: Moderately Isolated (04/20/2023)   Social Connection and Isolation Panel    Frequency of Communication with Friends and Family: More than three times a week    Frequency of Social Gatherings with Friends and Family: Once a week    Attends Religious Services: Never  Active Member of Clubs or Organizations: No    Attends Banker Meetings: 1 to 4 times per year    Marital Status: Never married  Intimate Partner Violence: Not At Risk (06/24/2023)   Humiliation, Afraid, Rape, and Kick questionnaire    Fear of Current or Ex-Partner: No    Emotionally Abused: No    Physically Abused: No    Sexually Abused: No    Family History  Problem Relation Age of Onset   Heart disease Mother    Lung cancer Father    Colon cancer Neg Hx    Esophageal cancer Neg Hx    Liver cancer Neg Hx    Pancreatic cancer Neg Hx    Rectal cancer Neg Hx    Stomach cancer Neg Hx     Past Surgical History:  Procedure Laterality Date   COLONOSCOPY  05/2017   FISTULOTOMY N/A 12/12/2021   Procedure: SETON PLACEMENT;  Surgeon: Debby Hila, MD;  Location: [facility];  Service: General;  Laterality: N/A;   LIGATION OF INTERNAL FISTULA TRACT N/A 05/22/2022   Procedure: LIGATION OF INTERNAL FISTULA TRACT;  Surgeon: Debby Hila, MD;  Location: [facility];  Service: General;  Laterality: N/A;    ROS: Review of  Systems Negative except as stated above  PHYSICAL EXAM: BP 114/75 (BP Location: Left Arm, Patient Position: Sitting, Cuff Size: Large)   Pulse 82   Temp 98.1 F (36.7 C) (Oral)   Ht 5' 7 (1.702 m)   Wt 294 lb (133.4 kg)   SpO2 97%   BMI 46.05 kg/m   Wt Readings from Last 3 Encounters:  12/28/23 294 lb (133.4 kg)  08/28/23 299 lb (135.6 kg)  06/26/23 295 lb (133.8 kg)    Physical Exam General appearance - alert, well appearing, and in no distress Mental status - normal mood, behavior, speech, dress, motor activity, and thought processes Neck - supple, no significant adenopathy Chest - clear to auscultation, no wheezes, rales or rhonchi, symmetric air entry Heart - normal rate, regular rhythm, normal S1, S2, no murmurs, rubs, clicks or gallops Extremities - trace LE edema     Latest Ref Rng & Units 06/25/2023    3:24 AM 06/24/2023    4:55 AM 06/23/2023   12:04 PM  CMP  Glucose 70 - 99 mg/dL 865  865  840   BUN 8 - 23 mg/dL 18  19  17    Creatinine 0.61 - 1.24 mg/dL 8.89  8.77  8.60   Sodium 135 - 145 mmol/L 136  133  133   Potassium 3.5 - 5.1 mmol/L 3.8  3.0  3.5   Chloride 98 - 111 mmol/L 103  97  96   CO2 22 - 32 mmol/L 21  22  21    Calcium 8.9 - 10.3 mg/dL 9.3  8.8  9.4    Lipid Panel     Component Value Date/Time   CHOL 145 12/19/2022 1634   TRIG 144 12/19/2022 1634   HDL 37 (L) 12/19/2022 1634   CHOLHDL 3.9 12/19/2022 1634   LDLCALC 83 12/19/2022 1634    CBC    Component Value Date/Time   WBC 10.9 (H) 06/25/2023 0324   RBC 4.95 06/25/2023 0324   HGB 15.3 06/25/2023 0324   HGB 15.5 12/19/2022 1634   HCT 44.6 06/25/2023 0324   HCT 46.1 12/19/2022 1634   PLT 194 06/25/2023 0324   PLT 226 12/19/2022 1634   MCV 90.1 06/25/2023 0324  MCV 90 12/19/2022 1634   MCH 30.9 06/25/2023 0324   MCHC 34.3 06/25/2023 0324   RDW 12.9 06/25/2023 0324   RDW 12.8 12/19/2022 1634    ASSESSMENT AND PLAN: 1. Type 2 diabetes mellitus with morbid obesity (HCC)  (Primary) At goal but with minimal weight loss on Trulicity . Encourage healthy eating habits. Discussed changing Trulicity  to Ozempic  to see if we will have greater success with weight loss.  He is agreeable to this.  Stop Trulicity .  Start Ozempic  starting at 0.5 mg dose once a week.  Went over side effects.  Advised to stop the medicine if he develops any severe diarrhea, abdominal pain, palpitations, vomiting. -Commended him on starting to walk again for exercise.  Encouraged him to try to get in some exercise at least 3 to 5 days a week for 30 minutes. - POCT glycosylated hemoglobin (Hb A1C) - POCT glucose (manual entry) - glucose blood (ACCU-CHEK GUIDE TEST) test strip; Use to check blood sugar 3 times daily.  Dispense: 100 each; Refill: 6 - Lancets Misc. (ACCU-CHEK SOFTCLIX LANCET DEV) KIT; Use to check blood sugar.  Dispense: 1 kit; Refill: 0 - Semaglutide ,0.25 or 0.5MG /DOS, (OZEMPIC , 0.25 OR 0.5 MG/DOSE,) 2 MG/3ML SOPN; Inject 0.5 mg into the skin once a week.  Dispense: 3 mL; Refill: 1  2. Long-term (current) use of injectable non-insulin  antidiabetic drugs See #1 above.  3. Hypertension associated with diabetes (HCC) At goal.  Continue amlodipine  10 mg daily, carvedilol  12.5 mg twice a day, lisinopril  40 mg daily and HCTZ - amLODipine  (NORVASC ) 10 MG tablet; Take 1 tablet (10 mg total) by mouth daily.  Dispense: 90 tablet; Refill: 3  4. Hyperlipidemia, unspecified hyperlipidemia type Continue Pravachol  40 mg daily - Hepatic Function Panel - Lipid panel  5. Personal history of pulmonary embolism He is currently at 6 months of the Eliquis .  His PE was thought to be due to being sedentary post surgery.  At this time, we will stop the Eliquis .  Encourage patient to move as much as he can.  Advised that if he remains sedentary, this can increase the probability of having another PE/DVT  6. Prostate nodule Plan for prostate biopsy as scheduled by his urologist for October.  Patient  was given the opportunity to ask questions.  Patient verbalized understanding of the plan and was able to repeat key elements of the plan.   This documentation was completed using Paediatric nurse.  Any transcriptional errors are unintentional.  Orders Placed This Encounter  Procedures   Hepatic Function Panel   Lipid panel   POCT glycosylated hemoglobin (Hb A1C)   POCT glucose (manual entry)     Requested Prescriptions   Signed Prescriptions Disp Refills   glucose blood (ACCU-CHEK GUIDE TEST) test strip 100 each 6    Sig: Use to check blood sugar 3 times daily.   Lancets Misc. (ACCU-CHEK SOFTCLIX LANCET DEV) KIT 1 kit 0    Sig: Use to check blood sugar.   amLODipine  (NORVASC ) 10 MG tablet 90 tablet 3    Sig: Take 1 tablet (10 mg total) by mouth daily.   Semaglutide ,0.25 or 0.5MG /DOS, (OZEMPIC , 0.25 OR 0.5 MG/DOSE,) 2 MG/3ML SOPN 3 mL 1    Sig: Inject 0.5 mg into the skin once a week.    Return in about 4 months (around 04/28/2024).  Barnie Louder, MD, FACP

## 2023-12-29 ENCOUNTER — Other Ambulatory Visit: Payer: Self-pay

## 2023-12-29 ENCOUNTER — Ambulatory Visit: Payer: Self-pay | Admitting: Internal Medicine

## 2023-12-29 LAB — HEPATIC FUNCTION PANEL
ALT: 19 IU/L (ref 0–44)
AST: 21 IU/L (ref 0–40)
Albumin: 4.5 g/dL (ref 3.9–4.9)
Alkaline Phosphatase: 53 IU/L (ref 44–121)
Bilirubin Total: 1 mg/dL (ref 0.0–1.2)
Bilirubin, Direct: 0.33 mg/dL (ref 0.00–0.40)
Total Protein: 7.9 g/dL (ref 6.0–8.5)

## 2023-12-29 LAB — LIPID PANEL
Chol/HDL Ratio: 3.8 ratio (ref 0.0–5.0)
Cholesterol, Total: 144 mg/dL (ref 100–199)
HDL: 38 mg/dL — ABNORMAL LOW (ref >39)
LDL Chol Calc (NIH): 90 mg/dL (ref 0–99)
Triglycerides: 84 mg/dL (ref 0–149)
VLDL Cholesterol Cal: 16 mg/dL (ref 5–40)

## 2023-12-30 ENCOUNTER — Other Ambulatory Visit (HOSPITAL_COMMUNITY): Payer: Self-pay

## 2024-01-01 ENCOUNTER — Other Ambulatory Visit: Payer: Self-pay

## 2024-01-05 ENCOUNTER — Other Ambulatory Visit: Payer: Self-pay

## 2024-01-26 ENCOUNTER — Other Ambulatory Visit (HOSPITAL_BASED_OUTPATIENT_CLINIC_OR_DEPARTMENT_OTHER): Payer: Self-pay

## 2024-01-26 ENCOUNTER — Other Ambulatory Visit: Payer: Self-pay

## 2024-01-26 ENCOUNTER — Other Ambulatory Visit: Payer: Self-pay | Admitting: Internal Medicine

## 2024-01-26 MED ORDER — OZEMPIC (0.25 OR 0.5 MG/DOSE) 2 MG/3ML ~~LOC~~ SOPN
0.5000 mg | PEN_INJECTOR | SUBCUTANEOUS | 2 refills | Status: DC
  Filled 2024-01-26: qty 3, 28d supply, fill #0
  Filled 2024-02-26: qty 3, 28d supply, fill #1
  Filled 2024-03-25: qty 3, 28d supply, fill #2

## 2024-01-27 ENCOUNTER — Other Ambulatory Visit (HOSPITAL_COMMUNITY): Payer: Self-pay

## 2024-02-01 ENCOUNTER — Other Ambulatory Visit: Payer: Self-pay

## 2024-02-04 ENCOUNTER — Other Ambulatory Visit: Payer: Self-pay

## 2024-02-23 ENCOUNTER — Other Ambulatory Visit: Payer: Self-pay

## 2024-02-23 MED ORDER — LEVOFLOXACIN 750 MG PO TABS
750.0000 mg | ORAL_TABLET | Freq: Once | ORAL | 0 refills | Status: AC
  Filled 2024-02-23: qty 1, 1d supply, fill #0

## 2024-02-26 ENCOUNTER — Other Ambulatory Visit: Payer: Self-pay

## 2024-03-11 LAB — OPHTHALMOLOGY REPORT-SCANNED

## 2024-03-25 ENCOUNTER — Other Ambulatory Visit: Payer: Self-pay

## 2024-04-05 ENCOUNTER — Other Ambulatory Visit: Payer: Self-pay

## 2024-04-06 ENCOUNTER — Other Ambulatory Visit: Payer: Self-pay

## 2024-04-15 ENCOUNTER — Other Ambulatory Visit: Payer: Self-pay

## 2024-05-02 ENCOUNTER — Other Ambulatory Visit: Payer: Self-pay

## 2024-05-02 ENCOUNTER — Other Ambulatory Visit: Payer: Self-pay | Admitting: Internal Medicine

## 2024-05-02 DIAGNOSIS — E1169 Type 2 diabetes mellitus with other specified complication: Secondary | ICD-10-CM

## 2024-05-02 MED ORDER — OZEMPIC (0.25 OR 0.5 MG/DOSE) 2 MG/3ML ~~LOC~~ SOPN
0.5000 mg | PEN_INJECTOR | SUBCUTANEOUS | 0 refills | Status: DC
  Filled 2024-05-02: qty 3, 28d supply, fill #0

## 2024-05-03 ENCOUNTER — Other Ambulatory Visit: Payer: Self-pay

## 2024-05-03 ENCOUNTER — Ambulatory Visit: Attending: Internal Medicine | Admitting: Internal Medicine

## 2024-05-03 ENCOUNTER — Encounter: Payer: Self-pay | Admitting: Internal Medicine

## 2024-05-03 DIAGNOSIS — E1169 Type 2 diabetes mellitus with other specified complication: Secondary | ICD-10-CM | POA: Diagnosis not present

## 2024-05-03 DIAGNOSIS — I152 Hypertension secondary to endocrine disorders: Secondary | ICD-10-CM | POA: Diagnosis not present

## 2024-05-03 DIAGNOSIS — Z79899 Other long term (current) drug therapy: Secondary | ICD-10-CM | POA: Diagnosis not present

## 2024-05-03 DIAGNOSIS — Z6841 Body Mass Index (BMI) 40.0 and over, adult: Secondary | ICD-10-CM | POA: Diagnosis not present

## 2024-05-03 DIAGNOSIS — Z7985 Long-term (current) use of injectable non-insulin antidiabetic drugs: Secondary | ICD-10-CM | POA: Insufficient documentation

## 2024-05-03 DIAGNOSIS — N402 Nodular prostate without lower urinary tract symptoms: Secondary | ICD-10-CM | POA: Diagnosis not present

## 2024-05-03 DIAGNOSIS — E1159 Type 2 diabetes mellitus with other circulatory complications: Secondary | ICD-10-CM | POA: Insufficient documentation

## 2024-05-03 DIAGNOSIS — H6991 Unspecified Eustachian tube disorder, right ear: Secondary | ICD-10-CM | POA: Insufficient documentation

## 2024-05-03 DIAGNOSIS — E66813 Obesity, class 3: Secondary | ICD-10-CM | POA: Insufficient documentation

## 2024-05-03 DIAGNOSIS — I10 Essential (primary) hypertension: Secondary | ICD-10-CM

## 2024-05-03 DIAGNOSIS — R0982 Postnasal drip: Secondary | ICD-10-CM | POA: Diagnosis not present

## 2024-05-03 DIAGNOSIS — E785 Hyperlipidemia, unspecified: Secondary | ICD-10-CM

## 2024-05-03 LAB — POCT GLYCOSYLATED HEMOGLOBIN (HGB A1C): HbA1c, POC (controlled diabetic range): 6.6 % (ref 0.0–7.0)

## 2024-05-03 LAB — GLUCOSE, POCT (MANUAL RESULT ENTRY): POC Glucose: 120 mg/dL — AB (ref 70–99)

## 2024-05-03 MED ORDER — HYDROCHLOROTHIAZIDE 25 MG PO TABS
25.0000 mg | ORAL_TABLET | Freq: Every day | ORAL | 2 refills | Status: AC
  Filled 2024-05-03 – 2024-06-02 (×2): qty 90, 90d supply, fill #0

## 2024-05-03 MED ORDER — SEMAGLUTIDE (1 MG/DOSE) 4 MG/3ML ~~LOC~~ SOPN
1.0000 mg | PEN_INJECTOR | SUBCUTANEOUS | 3 refills | Status: AC
  Filled 2024-05-03 – 2024-06-02 (×2): qty 3, 28d supply, fill #0

## 2024-05-03 NOTE — Progress Notes (Signed)
 "   Patient ID: Scott Mcmillan, male    DOB: [birth year]  MRN: [id number]  CC: Diabetes (DM f/u./LT ear concern feeling full radiating to neck - requesting referral to ENT/)   Subjective: Scott Mcmillan is a 63 y.o. male who presents for chronic ds management. His concerns today include:  Patient with history of HTN, HL, morbid obesity, DM, submassive BL PE post abdominal/inguinal surgery, completed anticoagulation.   Discussed the use of AI scribe software for clinical note transcription with the patient, who gave verbal consent to proceed.  History of Present Illness [name] is a 63 year old male who presents for a four-month follow-up visit.  DM:  Results for orders placed or performed in visit on 05/03/24  POCT glucose (manual entry)   Collection Time: 05/03/24  4:08 PM  Result Value Ref Range   POC Glucose 120 (A) 70 - 99 mg/dl  POCT glycosylated hemoglobin (Hb A1C)   Collection Time: 05/03/24  4:08 PM  Result Value Ref Range   Hemoglobin A1C     HbA1c POC (<> result, manual entry)     HbA1c, POC (prediabetic range)     HbA1c, POC (controlled diabetic range) 6.6 0.0 - 7.0 %  He is here for follow-up on diabetes management. He is on Ozempic  0.5 mg once a week, which he tolerates well with minimal side effects, including occasional diarrhea. He has a decreased appetite and has lost 2 pounds since August, despite overindulging during the holidays. Has blood sugar log with him. He monitors his blood sugar levels regularly, with morning readings between 121-139 mg/dL and evening readings between 167-194 mg/dL.  HTN: his blood pressure log shows readings between 112-124/70-84 mmHg. He is on amlodipine  10 mg daily, hydrochlorothiazide  25 mg daily, lisinopril  40 mg daily, and carvedilol  12.5 mg twice a day. He tries to limit his salt intake.  For hyperlipidemia, he is on pravastatin  40 mg daily.  He reports a sensation of stickiness in his left ear and a need for professional  cleaning. He has a history of needing to 'pop' his ear since age 57. No ear pain, but there is a sensation of mucus in his throat, which he feels is not draining properly, causing him to cough and feel like he might choke. Makes him have to clear his throat intermittently. No nasal congestion, but he uses nasal spray occasionally for dryness.  He has not yet had his prostate biopsy, which is scheduled for January 20th. He had a prostate MRI in April, which showed three lesions in the prostate.    Patient Active Problem List   Diagnosis Date Noted   Prostate nodule 08/28/2023   Pulmonary embolism (HCC) 06/23/2023   Leg swelling 06/23/2023   Type 2 diabetes mellitus without complications (HCC) 06/23/2023   Abnormal chest x-ray 06/23/2023   AKI (acute kidney injury) 06/23/2023   Hypoxia 06/23/2023   Hyperlipidemia associated with type 2 diabetes mellitus (HCC) 11/08/2019   Influenza vaccine needed 01/13/2019   Non-recurrent bilateral inguinal hernia without obstruction or gangrene 04/23/2018   Hemorrhoids 01/22/2018   Actinic keratosis due to exposure to sunlight 07/21/2017   Lower extremity edema 07/21/2017   Class 3 severe obesity due to excess calories without serious comorbidity with body mass index (BMI) of 50.0 to 59.9 in adult (HCC) 07/21/2017   Hyperlipidemia with target low density lipoprotein (LDL) cholesterol less than 100 mg/dL 92/83/7981   Hypertension 10/31/2016     Medications Ordered Prior to Encounter[1]  Allergies[2]  Social  History   Socioeconomic History   Marital status: Single    Spouse name: Not on file   Number of children: Not on file   Years of education: Not on file   Highest education level: 12th grade  Occupational History   Not on file  Tobacco Use   Smoking status: Never   Smokeless tobacco: Never  Vaping Use   Vaping status: Never Used  Substance and Sexual Activity   Alcohol use: No   Drug use: No   Sexual activity: Not Currently  Other  Topics Concern   Not on file  Social History Narrative   Not on file   Social Drivers of Health   Tobacco Use: Low Risk (12/28/2023)   Patient History    Smoking Tobacco Use: Never    Smokeless Tobacco Use: Never    Passive Exposure: Not on file  Financial Resource Strain: Low Risk (05/02/2024)   Overall Financial Resource Strain (CARDIA)    Difficulty of Paying Living Expenses: Not very hard  Food Insecurity: No Food Insecurity (05/02/2024)   Epic    Worried About Radiation Protection Practitioner of Food in the Last Year: Never true    Ran Out of Food in the Last Year: Never true  Transportation Needs: No Transportation Needs (05/02/2024)   Epic    Lack of Transportation (Medical): No    Lack of Transportation (Non-Medical): No  Physical Activity: Insufficiently Active (05/02/2024)   Exercise Vital Sign    Days of Exercise per Week: 3 days    Minutes of Exercise per Session: 30 min  Stress: No Stress Concern Present (05/02/2024)   [employer] of Occupational Health - Occupational Stress Questionnaire    Feeling of Stress: Not at all  Social Connections: Socially Isolated (05/02/2024)   Social Connection and Isolation Panel    Frequency of Communication with Friends and Family: More than three times a week    Frequency of Social Gatherings with Friends and Family: Once a week    Attends Religious Services: Never    Database Administrator or Organizations: No    Attends Engineer, Structural: Not on file    Marital Status: Never married  Intimate Partner Violence: Not At Risk (06/24/2023)   Humiliation, Afraid, Rape, and Kick questionnaire    Fear of Current or Ex-Partner: No    Emotionally Abused: No    Physically Abused: No    Sexually Abused: No  Depression (PHQ2-9): Low Risk (12/28/2023)   Depression (PHQ2-9)    PHQ-2 Score: 0  Alcohol Screen: Low Risk (04/20/2023)   Alcohol Screen    Last Alcohol Screening Score (AUDIT): 0  Housing: Unknown (05/02/2024)   Epic    Unable  to Pay for Housing in the Last Year: No    Number of Times Moved in the Last Year: Not on file    Homeless in the Last Year: No  Utilities: Not At Risk (06/24/2023)   [employer] Utilities    Threatened with loss of utilities: No  Health Literacy: Adequate Health Literacy (04/20/2023)   B1300 Health Literacy    Frequency of need for help with medical instructions: Never    Family History  Problem Relation Age of Onset   Heart disease Mother    Lung cancer Father    Colon cancer Neg Hx    Esophageal cancer Neg Hx    Liver cancer Neg Hx    Pancreatic cancer Neg Hx    Rectal cancer Neg Hx  Stomach cancer Neg Hx     Past Surgical History:  Procedure Laterality Date   COLONOSCOPY  05/2017   FISTULOTOMY N/A 12/12/2021   Procedure: SETON PLACEMENT;  Surgeon: Debby Hila, MD;  Location: [facility];  Service: General;  Laterality: N/A;   LIGATION OF INTERNAL FISTULA TRACT N/A 05/22/2022   Procedure: LIGATION OF INTERNAL FISTULA TRACT;  Surgeon: Debby Hila, MD;  Location: [facility];  Service: General;  Laterality: N/A;    ROS: Review of Systems Negative except as stated above  PHYSICAL EXAM: BP 104/71 (BP Location: Left Arm, Patient Position: Sitting, Cuff Size: Normal)   Pulse 70   Temp (!) 97.5 F (36.4 C) (Oral)   Ht 5' 7 (1.702 m)   Wt 292 lb (132.5 kg)   SpO2 95%   BMI 45.73 kg/m   Wt Readings from Last 3 Encounters:  05/03/24 292 lb (132.5 kg)  12/28/23 294 lb (133.4 kg)  08/28/23 299 lb (135.6 kg)    Physical Exam   General appearance - alert, well appearing, and in no distress Mental status - normal mood, behavior, speech, dress, motor activity, and thought processes Ears -canals are very narrow.  No impacted cerumen.  TM not well-seen on either side Nose - normal and patent, no erythema, discharge or polyps Mouth -throat is without erythema or exudates.  Mild enlargement of tonsils. Neck - supple, no significant adenopathy.   No thyroid enlargement or nodules palpated Chest - clear to auscultation, no wheezes, rales or rhonchi, symmetric air entry Heart - normal rate, regular rhythm, normal S1, S2, no murmurs, rubs, clicks or gallops Extremities - peripheral pulses normal, no pedal edema, no clubbing or cyanosis     Latest Ref Rng & Units 12/28/2023    3:46 PM 06/25/2023    3:24 AM 06/24/2023    4:55 AM  CMP  Glucose 70 - 99 mg/dL  865  865   BUN 8 - 23 mg/dL  18  19   Creatinine 9.38 - 1.24 mg/dL  8.89  8.77   Sodium 864 - 145 mmol/L  136  133   Potassium 3.5 - 5.1 mmol/L  3.8  3.0   Chloride 98 - 111 mmol/L  103  97   CO2 22 - 32 mmol/L  21  22   Calcium 8.9 - 10.3 mg/dL  9.3  8.8   Total Protein 6.0 - 8.5 g/dL 7.9     Total Bilirubin 0.0 - 1.2 mg/dL 1.0     Alkaline Phos 44 - 121 IU/L 53     AST 0 - 40 IU/L 21     ALT 0 - 44 IU/L 19      Lipid Panel     Component Value Date/Time   CHOL 144 12/28/2023 1546   TRIG 84 12/28/2023 1546   HDL 38 (L) 12/28/2023 1546   CHOLHDL 3.8 12/28/2023 1546   LDLCALC 90 12/28/2023 1546    CBC    Component Value Date/Time   WBC 10.9 (H) 06/25/2023 0324   RBC 4.95 06/25/2023 0324   HGB 15.3 06/25/2023 0324   HGB 15.5 12/19/2022 1634   HCT 44.6 06/25/2023 0324   HCT 46.1 12/19/2022 1634   PLT 194 06/25/2023 0324   PLT 226 12/19/2022 1634   MCV 90.1 06/25/2023 0324   MCV 90 12/19/2022 1634   MCH 30.9 06/25/2023 0324   MCHC 34.3 06/25/2023 0324   RDW 12.9 06/25/2023 0324   RDW 12.8 12/19/2022 1634  ASSESSMENT AND PLAN: 1. Type 2 diabetes mellitus with morbid obesity (HCC) (Primary) A1c at 6.6% indicates good control. Blood sugar levels stable. No hypoglycemia. No significant wgh loss so far on Ozempic  which he is tolerating - Discussed increase Ozempic  to 1 mg weekly after he completes the current 0.5 mg stock.  Advised to watch for potential side effects including vomiting, abdominal pain, severe diarrhea/constipation palpitations.  Stop the medicine  and call if he develops any of these side effects - Continue regular blood sugar monitoring. - Encouraged dietary modifications for portion control. - POCT glucose (manual entry) - POCT glycosylated hemoglobin (Hb A1C) - Semaglutide , 1 MG/DOSE, 4 MG/3ML SOPN; Inject 1 mg as directed once a week. To start after completing 0.5 mg home stock  Dispense: 3 mL; Refill: 3 - Microalbumin / creatinine urine ratio  2. Long-term (current) use of injectable non-insulin  antidiabetic drugs See #1 above  3. Hypertension associated with diabetes (HCC) Blood pressure well-controlled with current regimen. - Continue amlodipine  10 mg daily, hydrochlorothiazide  25 mg daily, lisinopril  40 mg daily, carvedilol  12.5 mg twice daily. - Encouraged dietary sodium restriction. - hydrochlorothiazide  (HYDRODIURIL ) 25 MG tablet; Take 1 tablet (25 mg total) by mouth daily.  Dispense: 90 tablet; Refill: 2  4. Hyperlipidemia, unspecified hyperlipidemia type Continue pravastatin  40 mg daily  5. Prostate nodule Keep appointment with urology for prostate biopsy  6. Dysfunction of right eustachian tube 7. Postnasal drip Recommend trying Claritin over-the-counter.  If no improvement after 2 weeks, let me know so that we can refer to ENT.     Patient was given the opportunity to ask questions.  Patient verbalized understanding of the plan and was able to repeat key elements of the plan.   This documentation was completed using Paediatric nurse.  Any transcriptional errors are unintentional.  Orders Placed This Encounter  Procedures   Microalbumin / creatinine urine ratio   POCT glucose (manual entry)   POCT glycosylated hemoglobin (Hb A1C)     Requested Prescriptions   Signed Prescriptions Disp Refills   hydrochlorothiazide  (HYDRODIURIL ) 25 MG tablet 90 tablet 2    Sig: Take 1 tablet (25 mg total) by mouth daily.   Semaglutide , 1 MG/DOSE, 4 MG/3ML SOPN 3 mL 3    Sig: Inject 1 mg as  directed once a week. To start after completing 0.5 mg home stock    Return in about 4 weeks (around 05/31/2024) for  Give appt with me in 4 wks for Welcome to Medicare. RN visit in 1 wk for flu shot.SABRA Barnie Louder, MD, FACP    [1]  Current Outpatient Medications on File Prior to Visit  Medication Sig Dispense Refill   Accu-Chek Softclix Lancets lancets Use to check blood sugar 3 times daily. 100 each 6   amLODipine  (NORVASC ) 10 MG tablet Take 1 tablet (10 mg total) by mouth daily. 90 tablet 3   Blood Glucose Monitoring Suppl (ACCU-CHEK GUIDE) w/Device KIT Use to check blood sugar 3 times daily. 1 kit 0   carvedilol  (COREG ) 12.5 MG tablet Take 1 tablet (12.5 mg total) by mouth 2 (two) times daily with a meal. 180 tablet 3   glucose blood (ACCU-CHEK GUIDE TEST) test strip Use to check blood sugar 3 times daily. 100 each 6   Hydrocortisone  (PREPARATION H EX) Apply 1 Application topically daily as needed (hemmorhoids).     Lancets Misc. (ACCU-CHEK SOFTCLIX LANCET DEV) KIT Use to check blood sugar. 1 kit 0   lisinopril  (ZESTRIL ) 40  MG tablet Take 1 tablet (40 mg total) by mouth daily. 90 tablet 3   pravastatin  (PRAVACHOL ) 40 MG tablet Take 1 tablet (40 mg total) by mouth daily. 90 tablet 3   Docusate Sodium  (STOOL SOFTENER LAXATIVE PO) Take 100 mg by mouth. Takes 2 to 4 capsules daily as needed. (Patient not taking: Reported on 05/03/2024)     polyethylene glycol powder (GLYCOLAX /MIRALAX ) 17 GM/SCOOP powder Mix as directed and take 1 capful (17 g) with water by mouth daily as needed for mild constipation. (Patient not taking: Reported on 05/03/2024) 238 g 0   No current facility-administered medications on file prior to visit.  [2] No Known Allergies  "

## 2024-05-03 NOTE — Patient Instructions (Signed)
" °  VISIT SUMMARY: Scott Mcmillan, you had a follow-up visit to review your diabetes, hypertension, hyperlipidemia, and other health concerns. Your diabetes is well-controlled, and your blood pressure is stable. We discussed your upcoming prostate biopsy and addressed your ear and throat symptoms. We also talked about general health maintenance, including vaccinations and wellness visits.  YOUR PLAN: -TYPE 2 DIABETES MELLITUS: Type 2 diabetes is a condition where your body does not use insulin  properly, leading to high blood sugar levels. Your A1c is at 6.6%, indicating good control. We will increase your Ozempic  to 1 mg weekly after you finish your current 0.5 mg stock. Continue monitoring your blood sugar regularly and make dietary modifications for portion control.  -HYPERLIPIDEMIA: Hyperlipidemia means you have high levels of fats (lipids) in your blood. Continue taking pravastatin  40 mg daily to manage this condition.  -HYPERTENSION: Hypertension is high blood pressure. Your blood pressure is well-controlled with your current medications. Continue taking amlodipine  10 mg daily, hydrochlorothiazide  25 mg daily, lisinopril  40 mg daily, and carvedilol  12.5 mg twice daily. Keep limiting your salt intake.  -PROSTATE NODULE: A prostate nodule is an abnormal growth in the prostate gland. You have three lesions, and a biopsy is scheduled for January 20th. Contact your urologist for assistance with insurance coverage for the MRI.  -CHRONIC RHINITIS: Chronic rhinitis is long-term inflammation of the nasal passages. You have a sensation of sticky mucus and occasional throat clearing, possibly due to eustachian tube dysfunction. Start taking loratadine (Claritin) over-the-counter for two weeks. If symptoms persist, consider seeing an ENT specialist.  -GENERAL HEALTH MAINTENANCE: We discussed getting a flu shot and scheduling your Medicare wellness visit. Plan to get your flu shot in January when your insurance  is active and schedule your Medicare wellness visit for the end of January or early February.  INSTRUCTIONS: Proceed with your prostate biopsy on January 20th. Contact your urologist for assistance with insurance coverage for the MRI. Schedule your flu shot for January when your insurance is active. Schedule your Medicare wellness visit for the end of January or early February.                      Contains text generated by [employer]bridge.                                 Contains text generated by [employer]bridge.   "

## 2024-05-05 LAB — MICROALBUMIN / CREATININE URINE RATIO
Creatinine, Urine: 424.7 mg/dL
Microalb/Creat Ratio: 42 mg/g{creat} — ABNORMAL HIGH (ref 0–29)
Microalbumin, Urine: 176.5 ug/mL

## 2024-05-06 ENCOUNTER — Ambulatory Visit: Payer: Self-pay | Admitting: Internal Medicine

## 2024-05-10 ENCOUNTER — Ambulatory Visit: Payer: Self-pay | Attending: Family Medicine

## 2024-05-10 DIAGNOSIS — Z23 Encounter for immunization: Secondary | ICD-10-CM

## 2024-05-10 NOTE — Progress Notes (Signed)
Flu vaccine administered per protocols.  Information sheet given. Patient denies and pain or discomfort at injection site. Tolerated injection well no reaction.  

## 2024-05-19 ENCOUNTER — Other Ambulatory Visit: Payer: Self-pay

## 2024-06-02 ENCOUNTER — Ambulatory Visit: Payer: Self-pay | Attending: Internal Medicine | Admitting: Internal Medicine

## 2024-06-02 ENCOUNTER — Other Ambulatory Visit: Payer: Self-pay

## 2024-06-02 ENCOUNTER — Encounter: Payer: Self-pay | Admitting: Internal Medicine

## 2024-06-02 VITALS — BP 112/74 | HR 72 | Temp 97.6°F | Ht 67.0 in | Wt 294.0 lb

## 2024-06-02 DIAGNOSIS — Z0000 Encounter for general adult medical examination without abnormal findings: Secondary | ICD-10-CM | POA: Diagnosis not present

## 2024-06-02 DIAGNOSIS — Z7189 Other specified counseling: Secondary | ICD-10-CM

## 2024-06-02 NOTE — Progress Notes (Signed)
 "  Chief Complaint  Patient presents with   Welcome to [facility]    Welcome to [facility].      Subjective:   Scott Mcmillan is a 64 y.o. male who presents for a Welcome to Medicare Exam.  Patient with history of HTN, HL, morbid obesity, DM, submassive BL PE post abdominal/inguinal surgery, completed anticoagulation.   Visit info / Clinical Intake: Medicare Wellness Visit Type:: Welcome to [employer] (IPPE) Persons participating in visit and providing information:: patient Medicare Wellness Visit Mode:: In-person (required for WTM) Interpreter Needed?: No Pre-visit prep was completed: no AWV questionnaire completed by patient prior to visit?: no Living arrangements:: (!) lives alone Patient's Overall Health Status Rating: good Typical amount of pain: some (Patient reports aches & pains due to aging but nothing concerns or affecting daily life) Does pain affect daily life?: no Are you currently prescribed opioids?: no  Dietary Habits and Nutritional Risks How many meals a day?: 2 Eats fruit and vegetables daily?: yes Most meals are obtained by: preparing own meals; eating out In the last 2 weeks, have you had any of the following?: none Diabetic:: (!) yes Any non-healing wounds?: no How often do you check your BS?: 2; 1 Would you like to be referred to a Nutritionist or for Diabetic Management? : no  Functional Status Activities of Daily Living (to include ambulation/medication): Independent Ambulation: Independent Medication Administration: Independent Home Management (perform basic housework or laundry): Independent Manage your own finances?: yes Primary transportation is: driving Concerns about vision?: no *vision screening is required for WTM* Concerns about hearing?: no  Fall Screening Falls in the past year?: 0 Number of falls in past year: 0 Was there an injury with Fall?: 0 Fall Risk Category Calculator: 0 Patient Fall Risk Level: Low Fall Risk  Fall Risk Patient at Risk  for Falls Due to: No Fall Risks Fall risk Follow up: Falls evaluation completed  Home and Transportation Safety: All rugs have non-skid backing?: yes All stairs or steps have railings?: yes Grab bars in the bathtub or shower?: yes Have non-skid surface in bathtub or shower?: yes Good home lighting?: yes Regular seat belt use?: yes Hospital stays in the last year:: (!) yes How many hospital stays:: 1 (Surgery. 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 : no  Advance Directives (For Healthcare) Does Patient Have a Medical Advance Directive?: No Would patient like information on creating a medical advance directive?: Yes (MAU/Ambulatory/Procedural Areas - Information given)  Reviewed/Updated  Reviewed/Updated: Reviewed All (Medical, Surgical, Family, Medications, Allergies, Care Teams, Patient Goals)    Allergies (verified) Patient has no known allergies.   Current Medications (verified) Outpatient Encounter Medications as of 06/02/2024  Medication Sig   Accu-Chek Softclix Lancets lancets Use to check blood sugar 3 times daily.   amLODipine  (NORVASC ) 10 MG tablet Take 1 tablet (10 mg total) by mouth daily.   Blood Glucose Monitoring Suppl (ACCU-CHEK GUIDE) w/Device KIT Use to check blood sugar 3 times daily.   carvedilol  (COREG ) 12.5 MG tablet Take 1 tablet (12.5 mg total) by mouth 2 (two) times daily with a meal.   glucose blood (ACCU-CHEK GUIDE TEST) test strip Use to check blood sugar 3 times daily.   hydrochlorothiazide  (HYDRODIURIL ) 25 MG tablet Take 1 tablet (25 mg total) by mouth daily.   Hydrocortisone  (PREPARATION H EX) Apply 1 Application topically daily as needed (hemmorhoids).   Lancets Misc. (ACCU-CHEK SOFTCLIX LANCET DEV) KIT Use to check blood sugar.  lisinopril  (ZESTRIL ) 40 MG tablet Take 1 tablet (40 mg total) by mouth daily.   pravastatin  (PRAVACHOL ) 40 MG tablet Take 1 tablet (40  mg total) by mouth daily.   Semaglutide , 1 MG/DOSE, 4 MG/3ML SOPN Inject 1 mg as directed once a week. To start after completing 0.5 mg home stock   Docusate Sodium  (STOOL SOFTENER LAXATIVE PO) Take 100 mg by mouth. Takes 2 to 4 capsules daily as needed. (Patient not taking: Reported on 06/02/2024)   polyethylene glycol powder (GLYCOLAX /MIRALAX ) 17 GM/SCOOP powder Mix as directed and take 1 capful (17 g) with water by mouth daily as needed for mild constipation. (Patient not taking: Reported on 06/02/2024)   No facility-administered encounter medications on file as of 06/02/2024.    History: Past Medical History:  Diagnosis Date   Bilateral inguinal hernia    per 09-28-2027 ct abdomen with contrast epic   Diabetes (HCC) Type 2    Dysrhythmia    RBBB   GERD (gastroesophageal reflux disease)    Hemorrhoids    History of kidney stones    Hyperlipidemia    Hypertension    Past Surgical History:  Procedure Laterality Date   COLONOSCOPY  05/2017   FISTULOTOMY N/A 12/12/2021   Procedure: SETON PLACEMENT;  Surgeon: Debby Hila, MD;  Location: [facility] [place];  Service: General;  Laterality: N/A;   LIGATION OF INTERNAL FISTULA TRACT N/A 05/22/2022   Procedure: LIGATION OF INTERNAL FISTULA TRACT;  Surgeon: Debby Hila, MD;  Location: [facility] [place];  Service: General;  Laterality: N/A;   Family History  Problem Relation Age of Onset   Heart disease Mother    Lung cancer Father    Colon cancer Neg Hx    Esophageal cancer Neg Hx    Liver cancer Neg Hx    Pancreatic cancer Neg Hx    Rectal cancer Neg Hx    Stomach cancer Neg Hx    Social History   Occupational History   Not on file  Tobacco Use   Smoking status: Never   Smokeless tobacco: Never  Vaping Use   Vaping status: Never Used  Substance and Sexual Activity   Alcohol use: No   Drug use: No   Sexual activity: Not Currently   Tobacco Counseling Counseling given: Not Answered  SDOH Screenings    Food Insecurity: No Food Insecurity (06/02/2024)  Housing: Low Risk (06/02/2024)  Transportation Needs: No Transportation Needs (06/02/2024)  Utilities: Not At Risk (06/02/2024)  Alcohol Screen: Low Risk (04/20/2023)  Depression (PHQ2-9): Low Risk (06/02/2024)  Financial Resource Strain: Low Risk (05/02/2024)  Physical Activity: Insufficiently Active (06/02/2024)  Social Connections: Moderately Isolated (06/02/2024)  Stress: No Stress Concern Present (06/02/2024)  Tobacco Use: Low Risk (06/02/2024)  Health Literacy: Adequate Health Literacy (06/02/2024)   See flowsheets for full screening details  Depression Screen PHQ 2 & 9 Depression Scale- Over the past 2 weeks, how often have you been bothered by any of the following problems? Little interest or pleasure in doing things: 0 Feeling down, depressed, or hopeless (PHQ Adolescent also includes...irritable): 0 PHQ-2 Total Score: 0 Trouble falling or staying asleep, or sleeping too much: 0 Feeling tired or having little energy: 0 Poor appetite or overeating (PHQ Adolescent also includes...weight loss): 0 Feeling bad about yourself - or that you are a failure or have let yourself or your family down: 0 Trouble concentrating on things, such as reading the newspaper or watching television (PHQ Adolescent also includes...like school work): 0 Moving or  speaking so slowly that other people could have noticed. Or the opposite - being so fidgety or restless that you have been moving around a lot more than usual: 0 Thoughts that you would be better off dead, or of hurting yourself in some way: 0 PHQ-9 Total Score: 0 If you checked off any problems, how difficult have these problems made it for you to do your work, take care of things at home, or get along with other people?: Not difficult at all      Goals Addressed             This Visit's Progress    Set My Weight Loss Goal       Want to be down 20 lbs by this time next yr. Plans to cut back  on amount that he eats.              Objective:    Today's Vitals   06/02/24 1433  BP: 112/74  Pulse: 72  Temp: 97.6 F (36.4 C)  TempSrc: Oral  SpO2: 96%  Weight: 294 lb (133.4 kg)  Height: 5' 7 (1.702 m)   Body mass index is 46.05 kg/m.   Wt Readings from Last 3 Encounters:  06/02/24 294 lb (133.4 kg)  05/03/24 292 lb (132.5 kg)  12/28/23 294 lb (133.4 kg)  Chest: CTA BL CVS: RRR  Physical Exam    Hearing/Vision screen Vision Screening   Right eye Left eye Both eyes  Without correction     With correction 20/25 20/30 20/20   Whisper test normal.  Immunizations and Health Maintenance Health Maintenance  Topic Date Due   Medicare Annual Wellness (AWV)  Never done   COVID-19 Vaccine (5 - 2025-26 season) 01/04/2024   Diabetic kidney evaluation - eGFR measurement  06/24/2024   FOOT EXAM  08/27/2024   Diabetic kidney evaluation - Urine ACR  11/01/2024   HEMOGLOBIN A1C  11/01/2024   OPHTHALMOLOGY EXAM  03/11/2025   Colonoscopy  06/17/2027   DTaP/Tdap/Td (2 - Td or Tdap) 10/23/2027   Pneumococcal Vaccine: 50+ Years  Completed   Influenza Vaccine  Completed   Hepatitis C Screening  Completed   HIV Screening  Completed   Zoster Vaccines- Shingrix  Completed   Hepatitis B Vaccines 19-59 Average Risk  Aged Out   Meningococcal B Vaccine  Aged Out    EKG: no. Has several EKGs on chart.      Assessment/Plan:  This is a routine wellness examination for Scott Mcmillan. 1. Encounter for Medicare annual wellness exam (Primary) Due for COVID-vaccine booster.  Advised that he can get that at any outside pharmacy if he wishes to have 1.  2. Advance directive discussed with patient Discussed advanced directive with him including living will and healthcare power of attorney.  Patient given packet.  Advised that if he execute a living will or healthcare power of attorney he should bring a copy for our records.   Patient Care Team: Vicci Barnie NOVAK, MD as PCP - General  (Internal Medicine)  I have personally reviewed and noted the following in the patients chart:   Medical and social history Use of alcohol, tobacco or illicit drugs  Current medications and supplements including opioid prescriptions. Functional ability and status Nutritional status Physical activity Advanced directives List of other physicians Hospitalizations, surgeries, and ER visits in previous 12 months Vitals Screenings to include cognitive, depression, and falls Referrals and appointments  No orders of the defined types were placed in this encounter.  In addition, I have reviewed and discussed with patient certain preventive protocols, quality metrics, and best practice recommendations. A written personalized care plan for preventive services as well as general preventive health recommendations were provided to patient.   Barnie Louder, MD   06/02/2024   Return in about 4 months (around 09/30/2024).  "

## 2024-06-02 NOTE — Patient Instructions (Signed)
 Consider getting a COVID booster vaccine at any outside pharmacy.  If you execute a Living Will/Health Care Power of Attorney, please bring a copy for our records.

## 2024-06-03 ENCOUNTER — Other Ambulatory Visit: Payer: Self-pay

## 2024-09-30 ENCOUNTER — Ambulatory Visit: Payer: Self-pay | Admitting: Internal Medicine
